# Patient Record
Sex: Female | Born: 1965 | Race: White | Hispanic: No | Marital: Married | Smoking: Never smoker
Health system: Southern US, Community
[De-identification: ages and names within clinical notes are randomized; demographics above are authoritative.]

## PROBLEM LIST (undated history)

## (undated) DIAGNOSIS — L409 Psoriasis, unspecified: Secondary | ICD-10-CM

## (undated) DIAGNOSIS — D369 Benign neoplasm, unspecified site: Secondary | ICD-10-CM

## (undated) DIAGNOSIS — F329 Major depressive disorder, single episode, unspecified: Secondary | ICD-10-CM

## (undated) DIAGNOSIS — G8929 Other chronic pain: Secondary | ICD-10-CM

## (undated) DIAGNOSIS — N390 Urinary tract infection, site not specified: Secondary | ICD-10-CM

## (undated) DIAGNOSIS — N201 Calculus of ureter: Secondary | ICD-10-CM

## (undated) DIAGNOSIS — G4730 Sleep apnea, unspecified: Secondary | ICD-10-CM

## (undated) DIAGNOSIS — R002 Palpitations: Secondary | ICD-10-CM

## (undated) DIAGNOSIS — K5790 Diverticulosis of intestine, part unspecified, without perforation or abscess without bleeding: Secondary | ICD-10-CM

## (undated) DIAGNOSIS — Z9889 Other specified postprocedural states: Secondary | ICD-10-CM

## (undated) DIAGNOSIS — I10 Essential (primary) hypertension: Secondary | ICD-10-CM

## (undated) DIAGNOSIS — K449 Diaphragmatic hernia without obstruction or gangrene: Secondary | ICD-10-CM

## (undated) DIAGNOSIS — A0472 Enterocolitis due to Clostridium difficile, not specified as recurrent: Secondary | ICD-10-CM

## (undated) DIAGNOSIS — E119 Type 2 diabetes mellitus without complications: Secondary | ICD-10-CM

## (undated) DIAGNOSIS — G479 Sleep disorder, unspecified: Secondary | ICD-10-CM

## (undated) DIAGNOSIS — R112 Nausea with vomiting, unspecified: Secondary | ICD-10-CM

## (undated) DIAGNOSIS — K429 Umbilical hernia without obstruction or gangrene: Secondary | ICD-10-CM

## (undated) DIAGNOSIS — M1990 Unspecified osteoarthritis, unspecified site: Secondary | ICD-10-CM

## (undated) DIAGNOSIS — N200 Calculus of kidney: Secondary | ICD-10-CM

## (undated) DIAGNOSIS — F32A Depression, unspecified: Secondary | ICD-10-CM

## (undated) DIAGNOSIS — K219 Gastro-esophageal reflux disease without esophagitis: Secondary | ICD-10-CM

## (undated) DIAGNOSIS — M549 Dorsalgia, unspecified: Secondary | ICD-10-CM

## (undated) DIAGNOSIS — F909 Attention-deficit hyperactivity disorder, unspecified type: Secondary | ICD-10-CM

## (undated) HISTORY — PX: CHOLECYSTECTOMY: SHX55

## (undated) HISTORY — PX: CYSTOSCOPY WITH RETROGRADE PYELOGRAM, URETEROSCOPY AND STENT PLACEMENT: SHX5789

## (undated) HISTORY — DX: Benign neoplasm, unspecified site: D36.9

## (undated) HISTORY — DX: Depression, unspecified: F32.A

## (undated) HISTORY — PX: ANTERIOR CRUCIATE LIGAMENT REPAIR: SHX115

## (undated) HISTORY — DX: Major depressive disorder, single episode, unspecified: F32.9

## (undated) HISTORY — DX: Diaphragmatic hernia without obstruction or gangrene: K44.9

## (undated) HISTORY — DX: Gastro-esophageal reflux disease without esophagitis: K21.9

## (undated) HISTORY — PX: PARTIAL COLECTOMY: SHX5273

## (undated) HISTORY — PX: WRIST SURGERY: SHX841

## (undated) HISTORY — DX: Enterocolitis due to Clostridium difficile, not specified as recurrent: A04.72

---

## 2005-08-24 ENCOUNTER — Emergency Department (HOSPITAL_COMMUNITY): Admission: EM | Admit: 2005-08-24 | Discharge: 2005-08-24 | Payer: Self-pay | Admitting: Family Medicine

## 2005-09-05 ENCOUNTER — Emergency Department (HOSPITAL_COMMUNITY): Admission: EM | Admit: 2005-09-05 | Discharge: 2005-09-05 | Payer: Self-pay | Admitting: Family Medicine

## 2005-11-26 ENCOUNTER — Emergency Department (HOSPITAL_COMMUNITY): Admission: EM | Admit: 2005-11-26 | Discharge: 2005-11-26 | Payer: Self-pay | Admitting: Emergency Medicine

## 2005-12-10 ENCOUNTER — Encounter: Admission: RE | Admit: 2005-12-10 | Discharge: 2005-12-10 | Payer: Self-pay | Admitting: Occupational Medicine

## 2005-12-27 ENCOUNTER — Encounter: Admission: RE | Admit: 2005-12-27 | Discharge: 2006-03-27 | Payer: Self-pay | Admitting: Occupational Medicine

## 2006-02-10 ENCOUNTER — Emergency Department (HOSPITAL_COMMUNITY): Admission: EM | Admit: 2006-02-10 | Discharge: 2006-02-10 | Payer: Self-pay | Admitting: Emergency Medicine

## 2006-02-12 ENCOUNTER — Encounter
Admission: RE | Admit: 2006-02-12 | Discharge: 2006-03-27 | Payer: Self-pay | Admitting: Physical Medicine and Rehabilitation

## 2006-02-14 ENCOUNTER — Encounter
Admission: RE | Admit: 2006-02-14 | Discharge: 2006-02-14 | Payer: Self-pay | Admitting: Physical Medicine and Rehabilitation

## 2006-03-07 ENCOUNTER — Emergency Department (HOSPITAL_COMMUNITY): Admission: EM | Admit: 2006-03-07 | Discharge: 2006-03-07 | Payer: Self-pay | Admitting: Emergency Medicine

## 2006-03-15 ENCOUNTER — Encounter
Admission: RE | Admit: 2006-03-15 | Discharge: 2006-03-15 | Payer: Self-pay | Admitting: Physical Medicine and Rehabilitation

## 2006-05-29 ENCOUNTER — Encounter
Admission: RE | Admit: 2006-05-29 | Discharge: 2006-05-29 | Payer: Self-pay | Admitting: Physical Medicine and Rehabilitation

## 2006-07-07 ENCOUNTER — Emergency Department (HOSPITAL_COMMUNITY): Admission: EM | Admit: 2006-07-07 | Discharge: 2006-07-07 | Payer: Self-pay | Admitting: Family Medicine

## 2006-07-15 ENCOUNTER — Encounter: Admission: RE | Admit: 2006-07-15 | Discharge: 2006-07-15 | Payer: Self-pay | Admitting: Specialist

## 2006-09-28 ENCOUNTER — Emergency Department (HOSPITAL_COMMUNITY): Admission: EM | Admit: 2006-09-28 | Discharge: 2006-09-28 | Payer: Self-pay | Admitting: Emergency Medicine

## 2006-11-10 ENCOUNTER — Emergency Department (HOSPITAL_COMMUNITY): Admission: EM | Admit: 2006-11-10 | Discharge: 2006-11-10 | Payer: Self-pay | Admitting: Emergency Medicine

## 2006-11-17 ENCOUNTER — Ambulatory Visit (HOSPITAL_COMMUNITY): Admission: EM | Admit: 2006-11-17 | Discharge: 2006-11-17 | Payer: Self-pay | Admitting: Emergency Medicine

## 2006-11-28 ENCOUNTER — Encounter: Admission: RE | Admit: 2006-11-28 | Discharge: 2006-11-28 | Payer: Self-pay | Admitting: Specialist

## 2006-12-15 ENCOUNTER — Emergency Department (HOSPITAL_COMMUNITY): Admission: EM | Admit: 2006-12-15 | Discharge: 2006-12-15 | Payer: Self-pay | Admitting: Emergency Medicine

## 2006-12-26 ENCOUNTER — Encounter: Admission: RE | Admit: 2006-12-26 | Discharge: 2006-12-26 | Payer: Self-pay | Admitting: Family Medicine

## 2006-12-26 ENCOUNTER — Ambulatory Visit (HOSPITAL_COMMUNITY): Admission: RE | Admit: 2006-12-26 | Discharge: 2006-12-26 | Payer: Self-pay | Admitting: Family Medicine

## 2007-01-17 ENCOUNTER — Emergency Department (HOSPITAL_COMMUNITY): Admission: EM | Admit: 2007-01-17 | Discharge: 2007-01-17 | Payer: Self-pay | Admitting: Emergency Medicine

## 2007-02-03 ENCOUNTER — Encounter
Admission: RE | Admit: 2007-02-03 | Discharge: 2007-03-12 | Payer: Self-pay | Admitting: Physical Medicine and Rehabilitation

## 2007-03-18 ENCOUNTER — Encounter
Admission: RE | Admit: 2007-03-18 | Discharge: 2007-06-16 | Payer: Self-pay | Admitting: Physical Medicine and Rehabilitation

## 2007-03-31 ENCOUNTER — Emergency Department (HOSPITAL_COMMUNITY): Admission: EM | Admit: 2007-03-31 | Discharge: 2007-03-31 | Payer: Self-pay | Admitting: Emergency Medicine

## 2007-05-13 ENCOUNTER — Emergency Department (HOSPITAL_COMMUNITY): Admission: EM | Admit: 2007-05-13 | Discharge: 2007-05-13 | Payer: Self-pay | Admitting: Emergency Medicine

## 2007-05-21 ENCOUNTER — Encounter: Admission: RE | Admit: 2007-05-21 | Discharge: 2007-08-19 | Payer: Self-pay | Admitting: Internal Medicine

## 2007-06-23 ENCOUNTER — Emergency Department (HOSPITAL_COMMUNITY): Admission: EM | Admit: 2007-06-23 | Discharge: 2007-06-23 | Payer: Self-pay | Admitting: Emergency Medicine

## 2007-07-04 ENCOUNTER — Ambulatory Visit (HOSPITAL_COMMUNITY): Admission: RE | Admit: 2007-07-04 | Discharge: 2007-07-04 | Payer: Self-pay | Admitting: Sports Medicine

## 2007-07-05 ENCOUNTER — Emergency Department (HOSPITAL_COMMUNITY): Admission: EM | Admit: 2007-07-05 | Discharge: 2007-07-05 | Payer: Self-pay | Admitting: Emergency Medicine

## 2007-08-04 ENCOUNTER — Emergency Department (HOSPITAL_COMMUNITY): Admission: EM | Admit: 2007-08-04 | Discharge: 2007-08-04 | Payer: Self-pay | Admitting: Emergency Medicine

## 2007-08-25 ENCOUNTER — Emergency Department (HOSPITAL_BASED_OUTPATIENT_CLINIC_OR_DEPARTMENT_OTHER): Admission: EM | Admit: 2007-08-25 | Discharge: 2007-08-25 | Payer: Self-pay | Admitting: Emergency Medicine

## 2007-09-12 ENCOUNTER — Emergency Department (HOSPITAL_COMMUNITY): Admission: EM | Admit: 2007-09-12 | Discharge: 2007-09-12 | Payer: Self-pay | Admitting: Emergency Medicine

## 2007-11-01 ENCOUNTER — Emergency Department (HOSPITAL_COMMUNITY): Admission: EM | Admit: 2007-11-01 | Discharge: 2007-11-01 | Payer: Self-pay | Admitting: Emergency Medicine

## 2007-11-08 ENCOUNTER — Emergency Department (HOSPITAL_COMMUNITY): Admission: EM | Admit: 2007-11-08 | Discharge: 2007-11-08 | Payer: Self-pay | Admitting: Emergency Medicine

## 2007-11-11 ENCOUNTER — Encounter: Admission: RE | Admit: 2007-11-11 | Discharge: 2007-12-30 | Payer: Self-pay | Admitting: Orthopedic Surgery

## 2007-11-19 ENCOUNTER — Emergency Department (HOSPITAL_COMMUNITY): Admission: EM | Admit: 2007-11-19 | Discharge: 2007-11-19 | Payer: Self-pay | Admitting: Emergency Medicine

## 2008-05-02 ENCOUNTER — Emergency Department (HOSPITAL_BASED_OUTPATIENT_CLINIC_OR_DEPARTMENT_OTHER): Admission: EM | Admit: 2008-05-02 | Discharge: 2008-05-02 | Payer: Self-pay | Admitting: Emergency Medicine

## 2008-05-07 ENCOUNTER — Emergency Department (HOSPITAL_COMMUNITY): Admission: EM | Admit: 2008-05-07 | Discharge: 2008-05-07 | Payer: Self-pay | Admitting: Family Medicine

## 2008-08-18 ENCOUNTER — Emergency Department (HOSPITAL_COMMUNITY): Admission: EM | Admit: 2008-08-18 | Discharge: 2008-08-18 | Payer: Self-pay | Admitting: Family Medicine

## 2008-09-07 ENCOUNTER — Emergency Department (HOSPITAL_COMMUNITY): Admission: EM | Admit: 2008-09-07 | Discharge: 2008-09-07 | Payer: Self-pay | Admitting: Emergency Medicine

## 2008-09-13 ENCOUNTER — Emergency Department (HOSPITAL_COMMUNITY): Admission: EM | Admit: 2008-09-13 | Discharge: 2008-09-13 | Payer: Self-pay | Admitting: Emergency Medicine

## 2008-12-05 ENCOUNTER — Emergency Department (HOSPITAL_COMMUNITY): Admission: EM | Admit: 2008-12-05 | Discharge: 2008-12-05 | Payer: Self-pay | Admitting: Emergency Medicine

## 2008-12-13 ENCOUNTER — Emergency Department (HOSPITAL_COMMUNITY): Admission: EM | Admit: 2008-12-13 | Discharge: 2008-12-13 | Payer: Self-pay | Admitting: Emergency Medicine

## 2008-12-24 ENCOUNTER — Emergency Department (HOSPITAL_COMMUNITY): Admission: EM | Admit: 2008-12-24 | Discharge: 2008-12-24 | Payer: Self-pay | Admitting: Emergency Medicine

## 2009-01-05 ENCOUNTER — Encounter: Admission: RE | Admit: 2009-01-05 | Discharge: 2009-01-05 | Payer: Self-pay | Admitting: Family Medicine

## 2009-01-07 ENCOUNTER — Ambulatory Visit (HOSPITAL_BASED_OUTPATIENT_CLINIC_OR_DEPARTMENT_OTHER): Admission: RE | Admit: 2009-01-07 | Discharge: 2009-01-07 | Payer: Self-pay | Admitting: Urology

## 2009-01-08 ENCOUNTER — Inpatient Hospital Stay (HOSPITAL_COMMUNITY): Admission: EM | Admit: 2009-01-08 | Discharge: 2009-01-08 | Payer: Self-pay | Admitting: Emergency Medicine

## 2009-01-13 ENCOUNTER — Ambulatory Visit (HOSPITAL_BASED_OUTPATIENT_CLINIC_OR_DEPARTMENT_OTHER): Admission: RE | Admit: 2009-01-13 | Discharge: 2009-01-13 | Payer: Self-pay | Admitting: Urology

## 2009-03-16 ENCOUNTER — Emergency Department (HOSPITAL_COMMUNITY): Admission: EM | Admit: 2009-03-16 | Discharge: 2009-03-16 | Payer: Self-pay | Admitting: Family Medicine

## 2009-03-22 ENCOUNTER — Emergency Department (HOSPITAL_COMMUNITY): Admission: EM | Admit: 2009-03-22 | Discharge: 2009-03-22 | Payer: Self-pay | Admitting: Emergency Medicine

## 2009-08-14 ENCOUNTER — Emergency Department (HOSPITAL_COMMUNITY): Admission: EM | Admit: 2009-08-14 | Discharge: 2009-08-14 | Payer: Self-pay | Admitting: Emergency Medicine

## 2009-08-16 ENCOUNTER — Emergency Department (HOSPITAL_COMMUNITY): Admission: EM | Admit: 2009-08-16 | Discharge: 2009-08-16 | Payer: Self-pay | Admitting: Emergency Medicine

## 2009-09-07 ENCOUNTER — Emergency Department (HOSPITAL_COMMUNITY): Admission: EM | Admit: 2009-09-07 | Discharge: 2009-09-07 | Payer: Self-pay | Admitting: Emergency Medicine

## 2009-10-21 ENCOUNTER — Ambulatory Visit (HOSPITAL_COMMUNITY): Admission: RE | Admit: 2009-10-21 | Discharge: 2009-10-21 | Payer: Self-pay | Admitting: Urology

## 2009-10-28 ENCOUNTER — Emergency Department (HOSPITAL_COMMUNITY): Admission: EM | Admit: 2009-10-28 | Discharge: 2009-10-28 | Payer: Self-pay | Admitting: Family Medicine

## 2009-12-20 ENCOUNTER — Emergency Department (HOSPITAL_COMMUNITY)
Admission: EM | Admit: 2009-12-20 | Discharge: 2009-12-20 | Payer: Self-pay | Source: Home / Self Care | Admitting: Family Medicine

## 2010-04-10 ENCOUNTER — Other Ambulatory Visit (HOSPITAL_COMMUNITY): Payer: Self-pay | Admitting: Neurosurgery

## 2010-04-10 DIAGNOSIS — R52 Pain, unspecified: Secondary | ICD-10-CM

## 2010-04-22 ENCOUNTER — Other Ambulatory Visit (HOSPITAL_COMMUNITY): Payer: Self-pay

## 2010-04-22 ENCOUNTER — Ambulatory Visit (HOSPITAL_COMMUNITY)
Admission: RE | Admit: 2010-04-22 | Discharge: 2010-04-22 | Disposition: A | Discharge status: Home / Self Care | Payer: PRIVATE HEALTH INSURANCE | Source: Ambulatory Visit | Attending: Neurosurgery | Admitting: Neurosurgery

## 2010-04-22 ENCOUNTER — Encounter (HOSPITAL_COMMUNITY): Payer: Self-pay

## 2010-04-22 DIAGNOSIS — M545 Low back pain, unspecified: Secondary | ICD-10-CM | POA: Insufficient documentation

## 2010-04-22 DIAGNOSIS — M51379 Other intervertebral disc degeneration, lumbosacral region without mention of lumbar back pain or lower extremity pain: Secondary | ICD-10-CM | POA: Insufficient documentation

## 2010-04-22 DIAGNOSIS — M48061 Spinal stenosis, lumbar region without neurogenic claudication: Secondary | ICD-10-CM | POA: Insufficient documentation

## 2010-04-22 DIAGNOSIS — M5137 Other intervertebral disc degeneration, lumbosacral region: Secondary | ICD-10-CM | POA: Insufficient documentation

## 2010-04-22 DIAGNOSIS — R52 Pain, unspecified: Secondary | ICD-10-CM

## 2010-04-22 DIAGNOSIS — M5126 Other intervertebral disc displacement, lumbar region: Secondary | ICD-10-CM | POA: Insufficient documentation

## 2010-04-22 DIAGNOSIS — M519 Unspecified thoracic, thoracolumbar and lumbosacral intervertebral disc disorder: Secondary | ICD-10-CM | POA: Insufficient documentation

## 2010-05-25 LAB — POCT RAPID STREP A (OFFICE): Streptococcus, Group A Screen (Direct): NEGATIVE

## 2010-05-26 LAB — URINE CULTURE: Culture  Setup Time: 201108200116

## 2010-05-26 LAB — SURGICAL PCR SCREEN: Staphylococcus aureus: POSITIVE — AB

## 2010-05-26 LAB — PREGNANCY, URINE: Preg Test, Ur: NEGATIVE

## 2010-05-26 LAB — POCT URINALYSIS DIPSTICK
Bilirubin Urine: NEGATIVE
Ketones, ur: NEGATIVE mg/dL
pH: 5.5 (ref 5.0–8.0)

## 2010-05-28 LAB — POCT I-STAT, CHEM 8
BUN: 24 mg/dL — ABNORMAL HIGH (ref 6–23)
Calcium, Ion: 1.15 mmol/L (ref 1.12–1.32)
Chloride: 105 mEq/L (ref 96–112)
Creatinine, Ser: 0.8 mg/dL (ref 0.4–1.2)
Glucose, Bld: 115 mg/dL — ABNORMAL HIGH (ref 70–99)
HCT: 45 % (ref 36.0–46.0)
Potassium: 4 mEq/L (ref 3.5–5.1)

## 2010-05-28 LAB — POCT URINALYSIS DIP (DEVICE)
Nitrite: NEGATIVE
Specific Gravity, Urine: >=1.03 (ref 1.005–1.030)
pH: 5.5 (ref 5.0–8.0)

## 2010-05-28 LAB — CBC
Hemoglobin: 14.7 g/dL (ref 12.0–15.0)
MCHC: 34.5 g/dL (ref 30.0–36.0)
MCV: 90.5 fL (ref 78.0–100.0)
RBC: 4.71 MIL/uL (ref 3.87–5.11)
WBC: 8.6 10*3/uL (ref 4.0–10.5)

## 2010-05-28 LAB — DIFFERENTIAL
Basophils Relative: 0 % (ref 0–1)
Lymphs Abs: 0.8 10*3/uL (ref 0.7–4.0)
Monocytes Absolute: 0.6 10*3/uL (ref 0.1–1.0)
Monocytes Relative: 7 % (ref 3–12)
Neutro Abs: 7.1 10*3/uL (ref 1.7–7.7)

## 2010-06-15 LAB — URINALYSIS, ROUTINE W REFLEX MICROSCOPIC
Bilirubin Urine: NEGATIVE
Glucose, UA: 250 mg/dL — AB
Glucose, UA: NEGATIVE mg/dL
Ketones, ur: NEGATIVE mg/dL
Leukocytes, UA: NEGATIVE
Leukocytes, UA: NEGATIVE
Nitrite: POSITIVE — AB
Protein, ur: NEGATIVE mg/dL
Protein, ur: NEGATIVE mg/dL
Protein, ur: 30 mg/dL — AB
Specific Gravity, Urine: 1.005 (ref 1.005–1.030)
Specific Gravity, Urine: 1.01 (ref 1.005–1.030)
Urobilinogen, UA: 0.2 mg/dL (ref 0.0–1.0)
Urobilinogen, UA: 0.2 mg/dL (ref 0.0–1.0)
Urobilinogen, UA: 0.2 mg/dL (ref 0.0–1.0)
pH: 6 (ref 5.0–8.0)

## 2010-06-15 LAB — URINE CULTURE: Colony Count: >=100000

## 2010-06-15 LAB — POCT I-STAT, CHEM 8
BUN: 11 mg/dL (ref 6–23)
BUN: 16 mg/dL (ref 6–23)
Calcium, Ion: 1.22 mmol/L (ref 1.12–1.32)
Chloride: 105 mEq/L (ref 96–112)
Creatinine, Ser: 0.7 mg/dL (ref 0.4–1.2)
Glucose, Bld: 91 mg/dL (ref 70–99)
Hemoglobin: 12.6 g/dL (ref 12.0–15.0)
Potassium: 3.9 mEq/L (ref 3.5–5.1)
Potassium: 4.2 mEq/L (ref 3.5–5.1)
Sodium: 137 mEq/L (ref 135–145)
TCO2: 21 mmol/L (ref 0–100)

## 2010-06-15 LAB — URINE MICROSCOPIC-ADD ON

## 2010-06-15 LAB — DIFFERENTIAL
Basophils Absolute: 0 10*3/uL (ref 0.0–0.1)
Basophils Relative: 0 % (ref 0–1)
Eosinophils Relative: 0 % (ref 0–5)
Monocytes Absolute: 0.1 10*3/uL (ref 0.1–1.0)
Monocytes Relative: 2 % — ABNORMAL LOW (ref 3–12)
Neutro Abs: 8.3 10*3/uL — ABNORMAL HIGH (ref 1.7–7.7)

## 2010-06-15 LAB — CBC
HCT: 36.5 % (ref 36.0–46.0)
Hemoglobin: 12.2 g/dL (ref 12.0–15.0)
MCHC: 33.6 g/dL (ref 30.0–36.0)
RBC: 3.96 MIL/uL (ref 3.87–5.11)
RDW: 13.7 % (ref 11.5–15.5)

## 2010-07-25 NOTE — Op Note (Signed)
NAMESAKARA, [name]            ACCOUNT NO.:  [id number]   MEDICAL RECORD NO.:  [id number]          PATIENT TYPE:  INP   LOCATION:  0098                         FACILITY:  [facility]   PHYSICIAN:  Excell Seltzer. Annabell Howells, M.D.    DATE OF BIRTH:  [birth year]   DATE OF PROCEDURE:  11/17/2006  DATE OF DISCHARGE:  11/17/2006                               OPERATIVE REPORT   PROCEDURE:  1. Cystoscopy.  2. Left retrograde pyelogram with interpretation.  3. Left flexible ureteroscopy with holmium laser tripsy and stone      extraction.  4. Placement of left 24 cm x 6-French double-J stent.   PREOPERATIVE DIAGNOSIS:  Left proximal ureteral stone.   POSTOPERATIVE DIAGNOSIS:  Left proximal ureteral stone.   SURGEON:  Excell Seltzer. Annabell Howells, M.D.   ANESTHESIA:  General specimen.   SPECIMENS:  Small stone fragment to pathology.   COMPLICATIONS:  None.   INDICATIONS:  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  She was seen in the ER a week or so ago for the same  complaint.  She has elected ureteroscopic treatment.   FINDINGS/PROCEDURE:  The patient is given Ancef and she was taken  operating room where general anesthetic was induced.  She was placed in  lithotomy position.  Her perineum and genitalia were prepped with  Betadine solution.  She was draped in usual sterile fashion.  Cystoscopy  was performed using a 22-French scope and the 12-degree lens.  Examination revealed a normal urethra.  The bladder wall was entirely  inspected and was unremarkable.  The ureteral orifices were  unremarkable.   Left ureteral orifice was cannulated with 5-French open-end catheter and  contrast was instilled in retrograde fashion.   Left retrograde pyelogram demonstrated a stone in the proximal ureter  with mild hydronephrosis.  The stone appears to be approximately 4 mm as  noted on CT scan.  No other abnormalities  were noted.   A guidewire was then passed to the kidney and the 12-French inner core  of an access sheath with dilator was used to dilate the ureter to the  kidney.  A 6-French flexible ureteroscope was then placed over the wire  to the kidney.  Inspection revealed that her stone had been pushed back  into the upper pole calix.  Thorough inspection of the remaining calyces  revealed no loose stones.   The holmium laser was used with the 0.273 fiber on 0.5 joules initially  at 8 Hz.  Was increased 0.8 joules as the treatment progressed.  The  stone was engaged with the laser and fragmented into approximately 2 mm  fragments.  A single small fragment was then removed with the nitinol  basket.   At this point a guidewire was reinserted to the kidney.  A 6-French 24  cm double-J stent was placed without difficulty under fluoroscopic  guidance.  Upon removal of the wire, good coil was noted in the kidney  and a good coil was noted in the bladder.  The bladder was drained.  The  patient was taken down from lithotomy position.  Her anesthetic was  reversed and she was moved to the  recovery room in stable condition.  There were no complications.      Excell Seltzer. Annabell Howells, M.D.  Electronically Signed     JJW/MEDQ  D:  11/17/2006  T:  11/18/2006  Job:  [id number]

## 2010-07-25 NOTE — Op Note (Signed)
[name], [name]            ACCOUNT NO.:  [id number]   MEDICAL RECORD NO.:  [id number]          PATIENT TYPE:  INP   LOCATION:  0098                         FACILITY:  [facility]   PHYSICIAN:  Excell Seltzer. Annabell Howells, M.D.    DATE OF BIRTH:  07-28-1965   DATE OF PROCEDURE:  11/17/2006  DATE OF DISCHARGE:                               OPERATIVE REPORT   CHIEF COMPLAINT:  Left flank pain.   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  She then had onset at 3:00 a.m. today of recurrent severe left  flank pain with nausea and vomiting.  A repeat CT scan reveals a 4-mm  left proximal stone in the same position as the prior stone.  She does  have hydronephrosis.  She has only gotten partial relief with pain  medication.   ALLERGIES:  CIPRO AND MORPHINE.   CURRENT MEDICATIONS:  Cymbalta, Robaxin, Ultram, ibuprofen, clonazepam,  and Diflucan.   PAST MEDICAL HISTORY:  1. Prior depression.  2. 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  3. GE reflux.  4. Recurrent yeast infections.   PAST SURGICAL HISTORY:  Left knee and right wrist surgery.   OBSTETRICAL HISTORY:  She is gravida 2, para 2 with vaginal deliveries  but currently has an IUD in place.   FAMILY HISTORY:  Noncontributory.   SOCIAL HISTORY:  She denies tobacco.  She drinks rare alcohol.   REVIEW OF SYSTEMS:  She has had chills without fever.  She has chronic  back pain with radicular pain into the lower extremities.  She has had  nausea and vomiting.  She denies any voiding complaints or hematuria.  She is otherwise entirely without complaints per full review of systems.   PHYSICAL EXAMINATION:  VITAL SIGNS:  Temperature 98.9, pulse 118,  respirations 24, blood pressure is 148/92.  GENERAL:  She is a well-developed, well-nourished  white female in no  acute distress, alert and oriented x3.  HEENT:  Normocephalic, atraumatic.  NECK:  Supple without thyromegaly or bruit.  LUNGS:  Clear with normal effort.  HEART:  Regular rate and rhythm.  ABDOMEN:  Soft, obese, with left upper quadrant and left CVA tenderness.  There is actually some referred pain from the right lower quadrant into  the left abdomen.  No mass, hepatosplenomegaly, or hernias are noted.  GU/RECTAL:  Exam was not done.  EXTREMITIES:  Full range of motion without edema.  NEUROLOGIC:  Grossly intact but medicated.  SKIN:  Warm and dry.   I have personally reviewed her CT films and concur with the findings.   LABORATORY DATA:  Her labs were reviewed.  Her white count is 8.4,  hemoglobin 12.8.  Electrolytes within normal limits with a BUN of 16,  creatinine 0.85.  Urinalysis is nitrite-negative with 3-6 white cells, 0-  2 red cells, and calcium oxalate crystals.   IMPRESSION:  Symptomatic left proximal ureteral stone.  PLAN:  I have discussed the options of continued observation versus  surgical intervention with the patient.  At this point, she wants to go  head and get something done.  I am going to get her set up for left  ureteroscopic stone extraction with possible stenting.  I explained the  risks in detail, including the possibility that the stone may not be  accessible and we may just have to leave a stent and do lithotripsy at a  later date.      Excell Seltzer. Annabell Howells, M.D.  Electronically Signed     JJW/MEDQ  D:  11/17/2006  T:  11/17/2006  Job:  [id number]

## 2010-07-25 NOTE — Consult Note (Signed)
NAMEDEEPA, [name]            ACCOUNT NO.:  [id number]   MEDICAL RECORD NO.:  [id number]          PATIENT TYPE:  INP   LOCATION:  0098                         FACILITY:  [facility]   PHYSICIAN:  Excell Seltzer. Annabell Howells, M.D.    DATE OF BIRTH:  May 22, 1965   DATE OF CONSULTATION:  11/17/2006  DATE OF DISCHARGE:  11/17/2006                                 CONSULTATION   HISTORY:  [name] is a 45 year old white female who I was asked to  see in consultation in the emergency room for severe left flank pain  that began at 3 a.m. this morning.  She was found on CT scan to have a 4-  mm, left proximal ureteral stone with hydro.  She was seen in the ER  approximately a week before for similar complaints, but felt she has  passed a stone.  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  She currently denies voiding complaints.   ALLERGIES:  Itching and nausea with MORPHINE and allergy to CIPRO.   CURRENT MEDICATIONS:  Cymbalta, Robaxin, Ultram, ibuprofen, clonazepam,  and Diflucan.   PAST MEDICAL HISTORY:  1. Depression.  2. Degenerative disk disease with a herniated disk and sciatica.  3. GE reflux.  4. Recurrent yeast infections.   PAST SURGICAL HISTORY:  Pertinent for left knee and right wrist surgery.   OB/GYN:  She is gravida 2, para 2 with vaginal deliveries and has an IUD  currently.   FAMILY HISTORY:  Noncontributory.   SOCIAL HISTORY:  No tobacco.  She drinks rare alcohol.  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   REVIEW OF SYSTEMS:  She has had chills without fever.  She has chronic  back pain with radicular pain.  She has had nausea and vomiting with her  current episode.  She denies any voiding complaints or hematuria.  Her  complete review of systems is otherwise unremarkable.   PHYSICAL EXAMINATION:  VITAL SIGNS:  Blood pressure is 148/92,  temperature 98.9, pulse 118, respirations 24.  GENERAL:  She is a well-developed, slightly  medicated white female in no  acute distress, alert and oriented x3.  NECK:  Supple without thyromegaly or bruit.  LUNGS:  Clear with normal effort.  HEART:  Regular rate and rhythm.  ABDOMEN:  Soft, obese with left CVA tenderness.  Left upper quadrant has  some tenderness and some referred pain.  She has no mass, splenomegaly  or hernias.  GU:  Normal external genitalia, urethral meatus unremarkable.  EXTREMITIES:  Have full range of motion without edema.  NEUROLOGIC:  Neurologic was grossly intact except for being slightly  medicated.  SKIN:  Warm and dry.   LABORATORY DATA:  I have reviewed her CT report and films.  I have also  reviewed her CBC, CMP and urinalysis.   IMPRESSION:  Left proximal ureteral stone.   PLAN:  After discussing options of continued observation, lithotripsy  and ureteroscopy, she has elected ureteroscopy.  Will proceed with  ureteroscopy with laser tripsy and possible stent placement later this  afternoon.  The risks  of the procedure were explained in detail.      Excell Seltzer. Annabell Howells, M.D.  Electronically Signed     JJW/MEDQ  D:  11/17/2006  T:  11/18/2006  Job:  [id number]

## 2010-07-25 NOTE — H&P (Signed)
NAMEMAHKAYLA, [name]            ACCOUNT NO.:  [id number]   MEDICAL RECORD NO.:  [id number]          PATIENT TYPE:  INP   LOCATION:  0098                         [facility]  [facility]   PHYSICIAN:  Corinna L. Lendell Caprice, MDDATE OF BIRTH:  Oct 12, 1965   DATE OF ADMISSION:  11/17/2006  DATE OF DISCHARGE:  11/17/2006                              HISTORY & PHYSICAL   NO DICTATION.      Corinna L. Lendell Caprice, MD  Electronically Signed     CLS/MEDQ  D:  11/17/2006  T:  11/17/2006  Job:  [id number]

## 2010-11-09 ENCOUNTER — Other Ambulatory Visit: Payer: Self-pay | Admitting: Family Medicine

## 2010-11-09 DIAGNOSIS — E041 Nontoxic single thyroid nodule: Secondary | ICD-10-CM

## 2010-11-10 ENCOUNTER — Other Ambulatory Visit: Payer: Self-pay | Admitting: Family Medicine

## 2010-11-15 ENCOUNTER — Emergency Department: Payer: Self-pay | Admitting: Emergency Medicine

## 2010-11-16 ENCOUNTER — Other Ambulatory Visit: Payer: PRIVATE HEALTH INSURANCE

## 2010-11-28 ENCOUNTER — Inpatient Hospital Stay: Payer: Self-pay | Admitting: Internal Medicine

## 2010-12-05 ENCOUNTER — Ambulatory Visit: Payer: Self-pay | Admitting: Gastroenterology

## 2010-12-06 ENCOUNTER — Ambulatory Visit (INDEPENDENT_AMBULATORY_CARE_PROVIDER_SITE_OTHER): Payer: PRIVATE HEALTH INSURANCE | Admitting: Surgery

## 2010-12-07 ENCOUNTER — Emergency Department: Payer: Self-pay | Admitting: Emergency Medicine

## 2010-12-20 ENCOUNTER — Ambulatory Visit: Payer: Self-pay | Admitting: Surgery

## 2010-12-21 LAB — DIFFERENTIAL
Basophils Absolute: 0
Basophils Relative: 0
Eosinophils Relative: 2
Monocytes Absolute: 0.4

## 2010-12-21 LAB — URINALYSIS, ROUTINE W REFLEX MICROSCOPIC
Bilirubin Urine: NEGATIVE
Ketones, ur: NEGATIVE
Nitrite: NEGATIVE
Urobilinogen, UA: 0.2
pH: 5.5

## 2010-12-21 LAB — BASIC METABOLIC PANEL
CO2: 25
Glucose, Bld: 94
Potassium: 4.6
Sodium: 133 — ABNORMAL LOW

## 2010-12-21 LAB — CBC
HCT: 38.4
Hemoglobin: 13.3
MCHC: 34.6
RDW: 13.5

## 2010-12-22 ENCOUNTER — Ambulatory Visit: Payer: Self-pay | Admitting: Surgery

## 2010-12-22 LAB — URINE MICROSCOPIC-ADD ON

## 2010-12-22 LAB — COMPREHENSIVE METABOLIC PANEL
ALT: 22
AST: 18
Albumin: 3.6
CO2: 26
Calcium: 9
Chloride: 106
GFR calc Af Amer: >60
GFR calc non Af Amer: >60
Sodium: 139
Total Bilirubin: 0.8

## 2010-12-22 LAB — CBC
Hemoglobin: 12.8
Platelets: 214
RBC: 4.15
RBC: 4.01
WBC: 8.4
WBC: 8.1

## 2010-12-22 LAB — DIFFERENTIAL
Eosinophils Absolute: 0.1
Eosinophils Relative: 1
Lymphocytes Relative: 27
Lymphocytes Relative: 26
Lymphs Abs: 2.3
Lymphs Abs: 2.1
Monocytes Absolute: 0.6
Monocytes Absolute: 0.6
Monocytes Relative: 7
Neutro Abs: 5.4

## 2010-12-22 LAB — URINALYSIS, ROUTINE W REFLEX MICROSCOPIC
Bilirubin Urine: NEGATIVE
Bilirubin Urine: NEGATIVE
Glucose, UA: NEGATIVE
Glucose, UA: NEGATIVE
Hgb urine dipstick: NEGATIVE
Ketones, ur: NEGATIVE
Leukocytes, UA: NEGATIVE
Protein, ur: NEGATIVE
Specific Gravity, Urine: 1.021
Urobilinogen, UA: 0.2
pH: 5.5

## 2010-12-22 LAB — URINE CULTURE: Colony Count: 70000

## 2010-12-22 LAB — BASIC METABOLIC PANEL
CO2: 25
Calcium: 9.8
GFR calc Af Amer: >60
GFR calc non Af Amer: >60
Sodium: 139

## 2010-12-23 ENCOUNTER — Emergency Department: Payer: Self-pay | Admitting: *Deleted

## 2011-01-04 ENCOUNTER — Inpatient Hospital Stay: Payer: Self-pay | Admitting: Surgery

## 2011-01-11 ENCOUNTER — Ambulatory Visit: Payer: Self-pay | Admitting: Gastroenterology

## 2011-02-21 ENCOUNTER — Ambulatory Visit: Payer: Self-pay | Admitting: Surgery

## 2011-03-05 ENCOUNTER — Ambulatory Visit: Payer: Self-pay | Admitting: Surgery

## 2011-03-12 ENCOUNTER — Inpatient Hospital Stay: Payer: Self-pay | Admitting: Emergency Medicine

## 2011-03-14 LAB — CBC WITH DIFFERENTIAL/PLATELET
Basophil #: 0.3 10*3/uL — ABNORMAL HIGH (ref 0.0–0.1)
Basophil %: 2.5 %
Eosinophil %: 1.1 %
HCT: 30.9 % — ABNORMAL LOW (ref 35.0–47.0)
HGB: 11.1 g/dL — ABNORMAL LOW (ref 12.0–16.0)
Lymphocyte %: 10.1 %
MCH: 32.5 pg (ref 26.0–34.0)
Neutrophil #: 9.3 10*3/uL — ABNORMAL HIGH (ref 1.4–6.5)
Neutrophil %: 80.6 %
Platelet: 186 10*3/uL (ref 150–440)
RBC: 3.42 10*6/uL — ABNORMAL LOW (ref 3.80–5.20)
WBC: 11.4 10*3/uL — ABNORMAL HIGH (ref 3.6–11.0)

## 2011-03-14 LAB — CREATININE, SERUM
Creatinine: 0.72 mg/dL (ref 0.60–1.30)
EGFR (African American): >60
EGFR (Non-African Amer.): >60

## 2011-03-15 LAB — CBC WITH DIFFERENTIAL/PLATELET
Basophil #: 0 10*3/uL (ref 0.0–0.1)
Eosinophil #: 0.3 10*3/uL (ref 0.0–0.7)
HCT: 30.1 % — ABNORMAL LOW (ref 35.0–47.0)
Lymphocyte #: 1.2 10*3/uL (ref 1.0–3.6)
Lymphocyte %: 17.6 %
MCHC: 33.5 g/dL (ref 32.0–36.0)
MCV: 92 fL (ref 80–100)
Monocyte #: 0.5 10*3/uL (ref 0.0–0.7)
Neutrophil #: 5 10*3/uL (ref 1.4–6.5)
Platelet: 168 10*3/uL (ref 150–440)
RDW: 13.5 % (ref 11.5–14.5)
WBC: 7 10*3/uL (ref 3.6–11.0)

## 2011-03-15 LAB — PATHOLOGY REPORT

## 2011-03-18 LAB — CBC WITH DIFFERENTIAL/PLATELET
Basophil %: 0.5 %
Eosinophil %: 4.2 %
HCT: 33.9 % — ABNORMAL LOW (ref 35.0–47.0)
Lymphocyte %: 18.1 %
MCH: 30.9 pg (ref 26.0–34.0)
MCHC: 33.7 g/dL (ref 32.0–36.0)
Monocyte %: 5.1 %
Neutrophil #: 5.5 10*3/uL (ref 1.4–6.5)
Neutrophil %: 72.1 %
Platelet: 252 10*3/uL (ref 150–440)
RBC: 3.69 10*6/uL — ABNORMAL LOW (ref 3.80–5.20)
RDW: 13.3 % (ref 11.5–14.5)
WBC: 7.6 10*3/uL (ref 3.6–11.0)

## 2011-07-26 ENCOUNTER — Ambulatory Visit: Payer: Self-pay | Admitting: Gastroenterology

## 2011-07-26 ENCOUNTER — Other Ambulatory Visit: Payer: Self-pay | Admitting: Gastroenterology

## 2011-07-26 LAB — CLOSTRIDIUM DIFFICILE BY PCR

## 2011-07-27 LAB — WBCS, STOOL

## 2011-07-28 LAB — STOOL CULTURE

## 2011-07-31 ENCOUNTER — Inpatient Hospital Stay: Payer: Self-pay | Admitting: Internal Medicine

## 2011-07-31 LAB — URINALYSIS, COMPLETE
Glucose,UR: NEGATIVE mg/dL (ref 0–75)
Protein: NEGATIVE
Squamous Epithelial: <1
WBC UR: <1 /HPF (ref 0–5)

## 2011-08-01 LAB — CBC WITH DIFFERENTIAL/PLATELET
Basophil %: 0.2 %
Eosinophil #: 0 10*3/uL (ref 0.0–0.7)
Eosinophil %: 0 %
HCT: 36 % (ref 35.0–47.0)
Lymphocyte %: 7.4 %
MCH: 30.4 pg (ref 26.0–34.0)
MCHC: 34 g/dL (ref 32.0–36.0)
Monocyte #: 0.1 x10 3/mm — ABNORMAL LOW (ref 0.2–0.9)
Monocyte %: 0.8 %
Neutrophil #: 9.5 10*3/uL — ABNORMAL HIGH (ref 1.4–6.5)
Platelet: 268 10*3/uL (ref 150–440)
RDW: 13.6 % (ref 11.5–14.5)
WBC: 10.4 10*3/uL (ref 3.6–11.0)

## 2011-08-01 LAB — BASIC METABOLIC PANEL
BUN: 15 mg/dL (ref 7–18)
Chloride: 102 mmol/L (ref 98–107)
Co2: 25 mmol/L (ref 21–32)
Creatinine: 0.85 mg/dL (ref 0.60–1.30)
Glucose: 151 mg/dL — ABNORMAL HIGH (ref 65–99)
Osmolality: 276 (ref 275–301)

## 2011-08-08 ENCOUNTER — Other Ambulatory Visit: Payer: Self-pay | Admitting: Gastroenterology

## 2011-08-08 LAB — CLOSTRIDIUM DIFFICILE BY PCR

## 2011-08-15 ENCOUNTER — Emergency Department: Payer: Self-pay | Admitting: Emergency Medicine

## 2011-09-12 ENCOUNTER — Emergency Department: Payer: Self-pay | Admitting: Emergency Medicine

## 2011-09-12 LAB — URINALYSIS, COMPLETE
Bilirubin,UR: NEGATIVE
Blood: NEGATIVE
Glucose,UR: NEGATIVE mg/dL (ref 0–75)
Ketone: NEGATIVE
Nitrite: NEGATIVE
RBC,UR: NONE SEEN /HPF (ref 0–5)
Specific Gravity: 1.015 (ref 1.003–1.030)
Squamous Epithelial: <1
WBC UR: <1 /HPF (ref 0–5)

## 2011-09-12 LAB — COMPREHENSIVE METABOLIC PANEL
Albumin: 4.1 g/dL (ref 3.4–5.0)
Anion Gap: 7 (ref 7–16)
Calcium, Total: 9.3 mg/dL (ref 8.5–10.1)
Co2: 28 mmol/L (ref 21–32)
Creatinine: 1.02 mg/dL (ref 0.60–1.30)
EGFR (Non-African Amer.): >60
Osmolality: 274 (ref 275–301)
Potassium: 3.6 mmol/L (ref 3.5–5.1)
Sodium: 137 mmol/L (ref 136–145)

## 2011-09-12 LAB — CBC
HCT: 40.4 % (ref 35.0–47.0)
MCH: 30.8 pg (ref 26.0–34.0)
MCV: 90 fL (ref 80–100)
Platelet: 213 10*3/uL (ref 150–440)
RBC: 4.48 10*6/uL (ref 3.80–5.20)
WBC: 8.3 10*3/uL (ref 3.6–11.0)

## 2011-09-14 ENCOUNTER — Other Ambulatory Visit: Payer: Self-pay | Admitting: Gastroenterology

## 2011-09-14 LAB — CLOSTRIDIUM DIFFICILE BY PCR

## 2011-10-18 ENCOUNTER — Other Ambulatory Visit: Payer: Self-pay | Admitting: Gastroenterology

## 2011-10-24 ENCOUNTER — Other Ambulatory Visit: Payer: Self-pay | Admitting: Gastroenterology

## 2011-11-05 ENCOUNTER — Other Ambulatory Visit: Payer: Self-pay | Admitting: Gastroenterology

## 2011-11-05 LAB — CLOSTRIDIUM DIFFICILE BY PCR

## 2012-09-11 ENCOUNTER — Emergency Department (HOSPITAL_COMMUNITY)
Admission: EM | Admit: 2012-09-11 | Discharge: 2012-09-11 | Disposition: A | Discharge status: Home / Self Care | Payer: 59 | Attending: Emergency Medicine | Admitting: Emergency Medicine

## 2012-09-11 ENCOUNTER — Encounter (HOSPITAL_COMMUNITY): Payer: Self-pay | Admitting: *Deleted

## 2012-09-11 ENCOUNTER — Emergency Department (HOSPITAL_COMMUNITY): Payer: 59

## 2012-09-11 DIAGNOSIS — R1110 Vomiting, unspecified: Secondary | ICD-10-CM | POA: Insufficient documentation

## 2012-09-11 DIAGNOSIS — Z8719 Personal history of other diseases of the digestive system: Secondary | ICD-10-CM | POA: Insufficient documentation

## 2012-09-11 DIAGNOSIS — Z79899 Other long term (current) drug therapy: Secondary | ICD-10-CM | POA: Insufficient documentation

## 2012-09-11 DIAGNOSIS — Z8739 Personal history of other diseases of the musculoskeletal system and connective tissue: Secondary | ICD-10-CM | POA: Insufficient documentation

## 2012-09-11 DIAGNOSIS — N200 Calculus of kidney: Secondary | ICD-10-CM | POA: Insufficient documentation

## 2012-09-11 DIAGNOSIS — F909 Attention-deficit hyperactivity disorder, unspecified type: Secondary | ICD-10-CM | POA: Insufficient documentation

## 2012-09-11 DIAGNOSIS — Z8679 Personal history of other diseases of the circulatory system: Secondary | ICD-10-CM | POA: Insufficient documentation

## 2012-09-11 HISTORY — DX: Diverticulosis of intestine, part unspecified, without perforation or abscess without bleeding: K57.90

## 2012-09-11 HISTORY — DX: Attention-deficit hyperactivity disorder, unspecified type: F90.9

## 2012-09-11 HISTORY — DX: Palpitations: R00.2

## 2012-09-11 HISTORY — DX: Other chronic pain: G89.29

## 2012-09-11 HISTORY — DX: Dorsalgia, unspecified: M54.9

## 2012-09-11 LAB — URINALYSIS, ROUTINE W REFLEX MICROSCOPIC
Bilirubin Urine: NEGATIVE
Glucose, UA: NEGATIVE mg/dL
Ketones, ur: NEGATIVE mg/dL
Protein, ur: 30 mg/dL — AB
Urobilinogen, UA: 0.2 mg/dL (ref 0.0–1.0)

## 2012-09-11 LAB — CBC WITH DIFFERENTIAL/PLATELET
Eosinophils Absolute: 0.1 10*3/uL (ref 0.0–0.7)
Hemoglobin: 14.2 g/dL (ref 12.0–15.0)
Lymphocytes Relative: 29 % (ref 12–46)
Lymphs Abs: 2.7 10*3/uL (ref 0.7–4.0)
MCH: 31.1 pg (ref 26.0–34.0)
Monocytes Relative: 5 % (ref 3–12)
Neutro Abs: 6.2 10*3/uL (ref 1.7–7.7)
Neutrophils Relative %: 65 % (ref 43–77)
Platelets: 275 10*3/uL (ref 150–400)
RBC: 4.57 MIL/uL (ref 3.87–5.11)
WBC: 9.4 10*3/uL (ref 4.0–10.5)

## 2012-09-11 LAB — URINE MICROSCOPIC-ADD ON

## 2012-09-11 LAB — COMPREHENSIVE METABOLIC PANEL
ALT: 41 U/L — ABNORMAL HIGH (ref 0–35)
Alkaline Phosphatase: 93 U/L (ref 39–117)
BUN: 15 mg/dL (ref 6–23)
Chloride: 102 mEq/L (ref 96–112)
GFR calc Af Amer: >90 mL/min (ref >90)
Glucose, Bld: 103 mg/dL — ABNORMAL HIGH (ref 70–99)
Potassium: 3.7 mEq/L (ref 3.5–5.1)
Sodium: 141 mEq/L (ref 135–145)
Total Bilirubin: 0.3 mg/dL (ref 0.3–1.2)
Total Protein: 7.5 g/dL (ref 6.0–8.3)

## 2012-09-11 LAB — PREGNANCY, URINE: Preg Test, Ur: NEGATIVE

## 2012-09-11 MED ORDER — MORPHINE SULFATE 4 MG/ML IJ SOLN
4.0000 mg | Freq: Once | INTRAMUSCULAR | Status: AC
  Administered 2012-09-11: 4 mg via INTRAVENOUS
  Filled 2012-09-11: qty 1

## 2012-09-11 MED ORDER — TAMSULOSIN HCL 0.4 MG PO CAPS: 0.4000 mg | ORAL_CAPSULE | Freq: Every day | ORAL | Status: DC

## 2012-09-11 MED ORDER — ONDANSETRON 4 MG PO TBDP: ORAL_TABLET | ORAL | Status: DC

## 2012-09-11 MED ORDER — ONDANSETRON HCL 4 MG/2ML IJ SOLN
4.0000 mg | Freq: Once | INTRAMUSCULAR | Status: AC
  Administered 2012-09-11: 4 mg via INTRAVENOUS
  Filled 2012-09-11: qty 2

## 2012-09-11 MED ORDER — OXYCODONE-ACETAMINOPHEN 5-325 MG PO TABS: 2.0000 | ORAL_TABLET | ORAL | Status: DC | PRN

## 2012-09-11 MED ORDER — SODIUM CHLORIDE 0.9 % IV BOLUS (SEPSIS)
1000.0000 mL | Freq: Once | INTRAVENOUS | Status: AC
  Administered 2012-09-11: 1000 mL via INTRAVENOUS

## 2012-09-11 MED ORDER — TAMSULOSIN HCL 0.4 MG PO CAPS
0.4000 mg | ORAL_CAPSULE | Freq: Once | ORAL | Status: AC
  Administered 2012-09-11: 0.4 mg via ORAL
  Filled 2012-09-11 (×2): qty 1

## 2012-09-11 MED ORDER — KETOROLAC TROMETHAMINE 30 MG/ML IJ SOLN
30.0000 mg | Freq: Once | INTRAMUSCULAR | Status: AC
  Administered 2012-09-11: 30 mg via INTRAVENOUS
  Filled 2012-09-11: qty 1

## 2012-09-11 MED ORDER — DIPHENHYDRAMINE HCL 50 MG/ML IJ SOLN
25.0000 mg | Freq: Once | INTRAMUSCULAR | Status: AC
  Administered 2012-09-11: 25 mg via INTRAVENOUS
  Filled 2012-09-11: qty 1

## 2012-09-11 MED ORDER — CEPHALEXIN 500 MG PO CAPS: 500.0000 mg | ORAL_CAPSULE | Freq: Four times a day (QID) | ORAL | Status: DC

## 2012-09-11 NOTE — ED Notes (Signed)
Patient has finished drinking contrast. 

## 2012-09-11 NOTE — ED Notes (Signed)
EDP notified of continued discomfort, medications ordered

## 2012-09-11 NOTE — ED Notes (Signed)
Reports not feeling well x 4-5 days and then today having stabbing RLQ pain and n/v. Last bm this am, denies diarrhea.

## 2012-09-11 NOTE — ED Provider Notes (Signed)
47 year old female chronic abdominal pain related to diverticulitis and prior surgery comes in with a new femoral pain which is in the right side a fairly well localized. On exam, she has definite severe tenderness over McBurney's area with questionable rebound tenderness. She will be sent for CT scan to evaluate for possible appendicitis.  I saw and evaluated the patient, reviewed the resident's note and I agree with the findings and plan.   Dione Booze, MD 09/11/12 1755

## 2012-09-11 NOTE — ED Provider Notes (Signed)
History    CSN: [id number] Arrival date & time 09/11/12  1239  First MD Initiated Contact with Patient 09/11/12 1547     Chief Complaint  Patient presents with  . Abdominal Pain  . Emesis   (Consider location/radiation/quality/duration/timing/severity/associated sxs/prior Treatment) HPI Comments: Pt w/ hx of kidney stones, diverticulosis s/p surgical resection w/ fistula and chronic abd pain now w/ RLQ pain. States worsening diffuse abd pain x 1 wk, then today noted severe RLQ pain - sharp and cramping. Radiating into hip. A/w n/v x1 and anorexia. No fever. No change in chronic diarrhea/constipation. No dysuria/polyuria/hematuria. No vaginal bleeding or discharge.   Patient is a 47 y.o. female presenting with abdominal pain and vomiting.  Abdominal Pain Associated symptoms include abdominal pain and vomiting. Pertinent negatives include no chest pain, chills, congestion, coughing, fever, headaches, nausea, rash or sore throat.  Emesis Associated symptoms: abdominal pain   Associated symptoms: no chills, no diarrhea, no headaches and no sore throat    Past Medical History  Diagnosis Date  . Diverticulosis   . Chronic back pain   . ADHD (attention deficit hyperactivity disorder)   . Palpitations    History reviewed. No pertinent past surgical history. History reviewed. No pertinent family history. History  Substance Use Topics  . Smoking status: Not on file  . Smokeless tobacco: Not on file  . Alcohol Use: No   OB History   Grav Para Term Preterm Abortions TAB SAB Ect Mult Living                 Review of Systems  Constitutional: Negative for fever and chills.  HENT: Negative for congestion and sore throat.   Respiratory: Negative for cough and shortness of breath.   Cardiovascular: Negative for chest pain and leg swelling.  Gastrointestinal: Positive for vomiting and abdominal pain. Negative for nausea, diarrhea and constipation.  Genitourinary: Negative for dysuria  and frequency.  Skin: Negative for color change and rash.  Neurological: Negative for dizziness and headaches.  Psychiatric/Behavioral: Negative for confusion and agitation.  All other systems reviewed and are negative.    Allergies  Ciprofloxacin; Flagyl; and Ivp dye  Home Medications  No current outpatient prescriptions on file. BP 157/106  Pulse 102  Temp(Src) 98.7 F (37.1 C) (Oral)  Resp 16  SpO2 98% Physical Exam  Vitals reviewed. Constitutional: She is oriented to person, place, and time. She appears well-developed and well-nourished. No distress.  HENT:  Head: Normocephalic and atraumatic.  Eyes: EOM are normal. Pupils are equal, round, and reactive to light.  Neck: Normal range of motion. Neck supple.  Cardiovascular: Normal rate and regular rhythm.   Pulmonary/Chest: Effort normal. No respiratory distress.  Abdominal: Soft. She exhibits no distension. There is tenderness. There is tenderness at McBurney's point. There is no rigidity, no rebound, no guarding and no CVA tenderness.    Musculoskeletal: Normal range of motion. She exhibits no edema.  Neurological: She is alert and oriented to person, place, and time.  Skin: Skin is warm and dry.  Psychiatric: She has a normal mood and affect. Her behavior is normal.    ED Course  Procedures (including critical care time) Labs Reviewed  CBC WITH DIFFERENTIAL  COMPREHENSIVE METABOLIC PANEL  LIPASE, BLOOD  URINALYSIS, ROUTINE W REFLEX MICROSCOPIC   Results for orders placed during the hospital encounter of 09/11/12  CBC WITH DIFFERENTIAL      Result Value Range   WBC 9.4  4.0 - 10.5 K/uL   RBC  4.57  3.87 - 5.11 MIL/uL   Hemoglobin 14.2  12.0 - 15.0 g/dL   HCT 13.0  86.5 - 78.4 %   MCV 88.0  78.0 - 100.0 fL   MCH 31.1  26.0 - 34.0 pg   MCHC 35.3  30.0 - 36.0 g/dL   RDW 69.6  29.5 - 28.4 %   Platelets 275  150 - 400 K/uL   Neutrophils Relative % 65  43 - 77 %   Neutro Abs 6.2  1.7 - 7.7 K/uL   Lymphocytes  Relative 29  12 - 46 %   Lymphs Abs 2.7  0.7 - 4.0 K/uL   Monocytes Relative 5  3 - 12 %   Monocytes Absolute 0.5  0.1 - 1.0 K/uL   Eosinophils Relative 1  0 - 5 %   Eosinophils Absolute 0.1  0.0 - 0.7 K/uL   Basophils Relative 0  0 - 1 %   Basophils Absolute 0.0  0.0 - 0.1 K/uL  COMPREHENSIVE METABOLIC PANEL      Result Value Range   Sodium 141  135 - 145 mEq/L   Potassium 3.7  3.5 - 5.1 mEq/L   Chloride 102  96 - 112 mEq/L   CO2 26  19 - 32 mEq/L   Glucose, Bld 103 (*) 70 - 99 mg/dL   BUN 15  6 - 23 mg/dL   Creatinine, Ser 1.32  0.50 - 1.10 mg/dL   Calcium 9.8  8.4 - 44.0 mg/dL   Total Protein 7.5  6.0 - 8.3 g/dL   Albumin 4.4  3.5 - 5.2 g/dL   AST 22  0 - 37 U/L   ALT 41 (*) 0 - 35 U/L   Alkaline Phosphatase 93  39 - 117 U/L   Total Bilirubin 0.3  0.3 - 1.2 mg/dL   GFR calc non Af Amer 82 (*) >90 mL/min   GFR calc Af Amer >90  >90 mL/min  LIPASE, BLOOD      Result Value Range   Lipase 23  11 - 59 U/L  URINALYSIS, ROUTINE W REFLEX MICROSCOPIC      Result Value Range   Color, Urine ORANGE (*) YELLOW   APPearance CLOUDY (*) CLEAR   Specific Gravity, Urine 1.029  1.005 - 1.030   pH 5.5  5.0 - 8.0   Glucose, UA NEGATIVE  NEGATIVE mg/dL   Hgb urine dipstick LARGE (*) NEGATIVE   Bilirubin Urine NEGATIVE  NEGATIVE   Ketones, ur NEGATIVE  NEGATIVE mg/dL   Protein, ur 30 (*) NEGATIVE mg/dL   Urobilinogen, UA 0.2  0.0 - 1.0 mg/dL   Nitrite NEGATIVE  NEGATIVE   Leukocytes, UA TRACE (*) NEGATIVE  PREGNANCY, URINE      Result Value Range   Preg Test, Ur NEGATIVE  NEGATIVE  URINE MICROSCOPIC-ADD ON      Result Value Range   Squamous Epithelial / LPF RARE  RARE   WBC, UA 3-6  <3 WBC/hpf   RBC / HPF TOO NUMEROUS TO COUNT  <3 RBC/hpf   Bacteria, UA FEW (*) RARE   Crystals CA OXALATE CRYSTALS (*) NEGATIVE   Urine-Other MUCOUS PRESENT      CT Abdomen Pelvis Wo Contrast (Final result)  Result time: 09/11/12 20:54:55    Final result by Rad Results In Interface (09/11/12  20:54:55)    Narrative:   *RADIOLOGY REPORT*  Clinical Data: Right lower quadrant pain, nausea and vomiting.  CT ABDOMEN AND PELVIS WITHOUT CONTRAST  Technique: Multidetector  CT imaging of the abdomen and pelvis was performed following the standard protocol without intravenous contrast.  Comparison: CT abdomen 11/16/2010.  Findings: Mild dependent atelectasis is seen in the lung bases. No pleural or pericardial effusion.  The patient has mild to moderate right hydronephrosis due to a 0.3 cm stone just below the right renal pelvis in the proximal most ureter. Approximately four punctate nonobstructing stones are identified in the right kidney. Two punctate nonobstructing stones are identified in the left kidney. No urinary bladder stones are seen. Uterus and adnexa appear normal.  The patient is status post cholecystectomy. There is fatty infiltration of the liver without focal liver lesion identified. The spleen, adrenal glands and pancreas appear normal. Surgical anastomosis is noted in the sigmoid colon. There are a few scattered colonic diverticula but no diverticulitis. The stomach, small bowel and appendix appear normal.  IMPRESSION:  1. Mild to moderate right hydronephrosis due to a 0.3 cm proximal right ureteral stone. Additional punctate nonobstructing stones are seen bilaterally. 2. Fatty infiltration of the liver.   Original Report Authenticated By: Holley Dexter, M.D.         No results found. No diagnosis found.  MDM  Exam significant for pain at McBurneys point and + rosvig, no rebound or guarding or clinical peritonitis. CVA-nttp. Concern for appendicitis, colitis, pyelonephritis, UTI. Doubt torsion, possible cyst. CT obtained and reveals 3mm obstructing right prox ureteral stone w/ mod hydro. Appendix normal.  Cr at baseline, u/a not impressive for UTI. Labs otherwise unremarkable. No leukocytosis. Given 1L IVF, morphine, toradol, zofran and flomax.  Pain well controlled. Stable for d/c home. Given rx for percocet, flomax and keflex. Encourage fup w/ urology. Given strict return precautions for fever, intractable emesis or worsening. Pain. D/c in good condition.   1. Kidney stone    Discharge Medication List as of 09/11/2012  9:08 PM    START taking these medications   Details  !! ondansetron (ZOFRAN ODT) 4 MG disintegrating tablet 4mg  ODT q4 hours prn nausea/vomit, Print    oxyCODONE-acetaminophen (PERCOCET) 5-325 MG per tablet Take 2 tablets by mouth every 4 (four) hours as needed for pain., Starting 09/11/2012, Until Discontinued, Print    tamsulosin (FLOMAX) 0.4 MG CAPS Take 1 capsule (0.4 mg total) by mouth daily., Starting 09/11/2012, Until Discontinued, Print     !! - Potential duplicate medications found. Please discuss with provider.     Gabriel Cirri, DO [address] [place] [address] [phone]   As needed if symptoms worsen    Audelia Hives, MD 09/12/12 [phone]

## 2012-09-14 ENCOUNTER — Encounter (HOSPITAL_COMMUNITY): Admission: EM | Disposition: A | Discharge status: Home / Self Care | Payer: Self-pay | Source: Home / Self Care

## 2012-09-14 ENCOUNTER — Emergency Department (HOSPITAL_COMMUNITY)
Admission: EM | Admit: 2012-09-14 | Discharge: 2012-09-14 | Disposition: A | Discharge status: Home / Self Care | Payer: 59 | Attending: Urology | Admitting: Urology

## 2012-09-14 ENCOUNTER — Encounter (HOSPITAL_COMMUNITY): Payer: Self-pay | Admitting: Certified Registered Nurse Anesthetist

## 2012-09-14 ENCOUNTER — Encounter (HOSPITAL_COMMUNITY): Payer: Self-pay | Admitting: Urology

## 2012-09-14 ENCOUNTER — Emergency Department (HOSPITAL_COMMUNITY): Payer: 59 | Admitting: Certified Registered Nurse Anesthetist

## 2012-09-14 DIAGNOSIS — N201 Calculus of ureter: Secondary | ICD-10-CM | POA: Diagnosis present

## 2012-09-14 DIAGNOSIS — N200 Calculus of kidney: Secondary | ICD-10-CM | POA: Insufficient documentation

## 2012-09-14 HISTORY — DX: Calculus of ureter: N20.1

## 2012-09-14 HISTORY — DX: Calculus of kidney: N20.0

## 2012-09-14 HISTORY — PX: CYSTOSCOPY WITH URETEROSCOPY: SHX5123

## 2012-09-14 LAB — URINE CULTURE: Culture: NO GROWTH

## 2012-09-14 LAB — URINALYSIS, ROUTINE W REFLEX MICROSCOPIC
Nitrite: NEGATIVE
Specific Gravity, Urine: 1.016 (ref 1.005–1.030)
Urobilinogen, UA: 0.2 mg/dL (ref 0.0–1.0)
pH: 6 (ref 5.0–8.0)

## 2012-09-14 SURGERY — CYSTOSCOPY WITH URETEROSCOPY
Anesthesia: General | Wound class: Clean Contaminated

## 2012-09-14 MED ORDER — OXYCODONE-ACETAMINOPHEN 5-325 MG PO TABS
ORAL_TABLET | ORAL | Status: AC
  Administered 2012-09-14: 1
  Filled 2012-09-14: qty 1

## 2012-09-14 MED ORDER — METOCLOPRAMIDE HCL 5 MG/ML IJ SOLN
INTRAMUSCULAR | Status: DC | PRN
  Administered 2012-09-14: 10 mg via INTRAVENOUS

## 2012-09-14 MED ORDER — HYDROMORPHONE BOLUS VIA INFUSION: 1.0000 mg | INTRAVENOUS | Status: DC | PRN

## 2012-09-14 MED ORDER — SCOPOLAMINE 1 MG/3DAYS TD PT72
MEDICATED_PATCH | TRANSDERMAL | Status: DC | PRN
  Administered 2012-09-14: 1 via TRANSDERMAL

## 2012-09-14 MED ORDER — HYOSCYAMINE SULFATE 0.125 MG SL SUBL: 0.1250 mg | SUBLINGUAL_TABLET | SUBLINGUAL | Status: DC | PRN

## 2012-09-14 MED ORDER — OXYCODONE-ACETAMINOPHEN 5-325 MG PO TABS: 2.0000 | ORAL_TABLET | ORAL | Status: DC | PRN

## 2012-09-14 MED ORDER — DIPHENHYDRAMINE HCL 50 MG/ML IJ SOLN
INTRAMUSCULAR | Status: AC
  Filled 2012-09-14: qty 1

## 2012-09-14 MED ORDER — LIDOCAINE HCL (CARDIAC) 20 MG/ML IV SOLN
INTRAVENOUS | Status: DC | PRN
  Administered 2012-09-14: 100 mg via INTRAVENOUS

## 2012-09-14 MED ORDER — HYOSCYAMINE SULFATE 0.125 MG SL SUBL
0.1250 mg | SUBLINGUAL_TABLET | Freq: Once | SUBLINGUAL | Status: AC
  Administered 2012-09-14: 0.125 mg via ORAL
  Filled 2012-09-14: qty 1

## 2012-09-14 MED ORDER — DIPHENHYDRAMINE HCL 50 MG/ML IJ SOLN
12.5000 mg | Freq: Once | INTRAMUSCULAR | Status: AC
  Administered 2012-09-14: 12.5 mg via INTRAVENOUS

## 2012-09-14 MED ORDER — LACTATED RINGERS IV SOLN
INTRAVENOUS | Status: DC | PRN
  Administered 2012-09-14: 20:00:00 via INTRAVENOUS

## 2012-09-14 MED ORDER — KETOROLAC TROMETHAMINE 30 MG/ML IJ SOLN
15.0000 mg | Freq: Once | INTRAMUSCULAR | Status: AC | PRN
  Administered 2012-09-14: 30 mg via INTRAVENOUS

## 2012-09-14 MED ORDER — MEPERIDINE HCL 50 MG/ML IJ SOLN: 6.2500 mg | INTRAMUSCULAR | Status: DC | PRN

## 2012-09-14 MED ORDER — HYDROMORPHONE HCL PF 1 MG/ML IJ SOLN
1.0000 mg | INTRAMUSCULAR | Status: DC | PRN
  Administered 2012-09-14 (×2): 1 mg via INTRAVENOUS
  Filled 2012-09-14 (×2): qty 1

## 2012-09-14 MED ORDER — PHENAZOPYRIDINE HCL 200 MG PO TABS
ORAL_TABLET | ORAL | Status: AC
  Administered 2012-09-14: 200 mg via ORAL
  Filled 2012-09-14: qty 1

## 2012-09-14 MED ORDER — IOHEXOL 300 MG/ML  SOLN
INTRAMUSCULAR | Status: AC
  Filled 2012-09-14: qty 1

## 2012-09-14 MED ORDER — HYDROMORPHONE HCL PF 1 MG/ML IJ SOLN
0.2500 mg | INTRAMUSCULAR | Status: DC | PRN
  Administered 2012-09-14 (×2): 0.5 mg via INTRAVENOUS

## 2012-09-14 MED ORDER — DIPHENHYDRAMINE HCL 50 MG/ML IJ SOLN
12.5000 mg | Freq: Once | INTRAMUSCULAR | Status: AC
  Administered 2012-09-14: 12.5 mg via INTRAVENOUS
  Filled 2012-09-14: qty 1

## 2012-09-14 MED ORDER — MIDAZOLAM HCL 5 MG/5ML IJ SOLN
INTRAMUSCULAR | Status: DC | PRN
  Administered 2012-09-14: 2 mg via INTRAVENOUS

## 2012-09-14 MED ORDER — PROMETHAZINE HCL 25 MG/ML IJ SOLN: 6.2500 mg | INTRAMUSCULAR | Status: DC | PRN

## 2012-09-14 MED ORDER — PHENAZOPYRIDINE HCL 200 MG PO TABS: 200.0000 mg | ORAL_TABLET | Freq: Three times a day (TID) | ORAL | Status: DC | PRN

## 2012-09-14 MED ORDER — MIDAZOLAM HCL 2 MG/2ML IJ SOLN: 0.5000 mg | Freq: Once | INTRAMUSCULAR | Status: DC | PRN

## 2012-09-14 MED ORDER — IOHEXOL 300 MG/ML  SOLN
INTRAMUSCULAR | Status: DC | PRN
  Administered 2012-09-14: 50 mL via URETHRAL

## 2012-09-14 MED ORDER — HYDROMORPHONE HCL PF 1 MG/ML IJ SOLN
INTRAMUSCULAR | Status: DC | PRN
  Administered 2012-09-14 (×4): 0.5 mg via INTRAVENOUS

## 2012-09-14 MED ORDER — PROPOFOL 10 MG/ML IV BOLUS
INTRAVENOUS | Status: DC | PRN
  Administered 2012-09-14: 200 mg via INTRAVENOUS

## 2012-09-14 MED ORDER — CEFAZOLIN SODIUM-DEXTROSE 2-3 GM-% IV SOLR: 2.0000 g | INTRAVENOUS | Status: DC

## 2012-09-14 MED ORDER — FENTANYL CITRATE 0.05 MG/ML IJ SOLN: 25.0000 ug | INTRAMUSCULAR | Status: DC | PRN

## 2012-09-14 MED ORDER — SCOPOLAMINE 1 MG/3DAYS TD PT72
MEDICATED_PATCH | TRANSDERMAL | Status: AC
  Filled 2012-09-14: qty 1

## 2012-09-14 MED ORDER — KETOROLAC TROMETHAMINE 30 MG/ML IJ SOLN
INTRAMUSCULAR | Status: AC
  Filled 2012-09-14: qty 1

## 2012-09-14 MED ORDER — SODIUM CHLORIDE 0.9 % IV SOLN
INTRAVENOUS | Status: DC
  Administered 2012-09-14: 15:00:00 via INTRAVENOUS

## 2012-09-14 MED ORDER — ONDANSETRON HCL 4 MG/2ML IJ SOLN
INTRAMUSCULAR | Status: DC | PRN
  Administered 2012-09-14: 4 mg via INTRAVENOUS

## 2012-09-14 MED ORDER — SODIUM CHLORIDE 0.9 % IR SOLN
Status: DC | PRN
  Administered 2012-09-14: 3000 mL via INTRAVESICAL

## 2012-09-14 MED ORDER — METOCLOPRAMIDE HCL 5 MG/ML IJ SOLN: 10.0000 mg | Freq: Once | INTRAMUSCULAR | Status: DC | PRN

## 2012-09-14 MED ORDER — 0.9 % SODIUM CHLORIDE (POUR BTL) OPTIME
TOPICAL | Status: DC | PRN
  Administered 2012-09-14: 1000 mL

## 2012-09-14 MED ORDER — LACTATED RINGERS IV SOLN: INTRAVENOUS | Status: DC

## 2012-09-14 MED ORDER — DEXAMETHASONE SODIUM PHOSPHATE 10 MG/ML IJ SOLN
INTRAMUSCULAR | Status: DC | PRN
  Administered 2012-09-14: 10 mg via INTRAVENOUS

## 2012-09-14 MED ORDER — HYDROMORPHONE HCL PF 1 MG/ML IJ SOLN
INTRAMUSCULAR | Status: AC
  Filled 2012-09-14: qty 1

## 2012-09-14 MED ORDER — ONDANSETRON HCL 4 MG/2ML IJ SOLN
4.0000 mg | Freq: Four times a day (QID) | INTRAMUSCULAR | Status: DC | PRN
Start: 2012-09-14 — End: 2012-09-15

## 2012-09-14 MED ORDER — PROMETHAZINE HCL 25 MG/ML IJ SOLN
6.2500 mg | INTRAMUSCULAR | Status: DC | PRN
  Filled 2012-09-14: qty 1

## 2012-09-14 MED ORDER — CEFAZOLIN SODIUM-DEXTROSE 2-3 GM-% IV SOLR
INTRAVENOUS | Status: AC
  Filled 2012-09-14: qty 50

## 2012-09-14 MED ORDER — NALBUPHINE HCL 10 MG/ML IJ SOLN
2.5000 mg | INTRAMUSCULAR | Status: DC | PRN
  Filled 2012-09-14: qty 0.25

## 2012-09-14 MED ORDER — CEFAZOLIN SODIUM-DEXTROSE 2-3 GM-% IV SOLR
INTRAVENOUS | Status: DC | PRN
  Administered 2012-09-14: 2 g via INTRAVENOUS

## 2012-09-14 SURGICAL SUPPLY — 17 items
BAG URO CATCHER STRL LF (DRAPE) ×2 IMPLANT
BASKET LASER NITINOL 1.9FR (BASKET) ×2 IMPLANT
BASKET ZERO TIP NITINOL 2.4FR (BASKET) ×1 IMPLANT
BSKT STON RTRVL 120 1.9FR (BASKET) ×1
BSKT STON RTRVL ZERO TP 2.4FR (BASKET) ×1
CATH URET 5FR 28IN OPEN ENDED (CATHETERS) ×2 IMPLANT
CLOTH BEACON ORANGE TIMEOUT ST (SAFETY) ×2 IMPLANT
DRAPE CAMERA CLOSED 9X96 (DRAPES) ×2 IMPLANT
GLOVE SURG SS PI 8.0 STRL IVOR (GLOVE) ×2 IMPLANT
GOWN PREVENTION PLUS XLARGE (GOWN DISPOSABLE) ×2 IMPLANT
GOWN STRL REIN XL XLG (GOWN DISPOSABLE) ×2 IMPLANT
MANIFOLD NEPTUNE II (INSTRUMENTS) ×2 IMPLANT
MARKER SKIN DUAL TIP RULER LAB (MISCELLANEOUS) ×2 IMPLANT
PACK CYSTO (CUSTOM PROCEDURE TRAY) ×2 IMPLANT
SHEATH ACCESS URETERAL 38CM (SHEATH) ×1 IMPLANT
STENT CONTOUR 6FRX24X.038 (STENTS) ×1 IMPLANT
TUBING CONNECTING 10 (TUBING) ×2 IMPLANT

## 2012-09-14 NOTE — ED Notes (Signed)
Dr. Annabell Howells phones prior to her arrival to inform us he plans to take pt. To O.R. For procedure.

## 2012-09-14 NOTE — Anesthesia Postprocedure Evaluation (Signed)
  Anesthesia Post-op Note  Patient: [name]  Procedure(s) Performed: Procedure(s): CYSTOSCOPY WITH URETEROSCOPY, with  stent placement (N/A)  Patient Location: PACU  Anesthesia Type:General  Level of Consciousness: awake, alert  and oriented  Airway and Oxygen Therapy: Patient Spontanous Breathing  Post-op Pain: mild  Post-op Assessment: Post-op Vital signs reviewed, Patient's Cardiovascular Status Stable, Respiratory Function Stable, Patent Airway, No signs of Nausea or vomiting and Pain level controlled  Post-op Vital Signs: Reviewed and stable  Complications: No apparent anesthesia complications

## 2012-09-14 NOTE — Op Note (Signed)
NAMETANIS, HENSARLING            ACCOUNT NO.:  [id number]  MEDICAL RECORD NO.:  [id number]  LOCATION:  [facility]                         FACILITY:  [facility]  PHYSICIAN:  Excell Seltzer. Annabell Howells, M.D.    DATE OF BIRTH:  02-Jan-1966  DATE OF PROCEDURE: DATE OF DISCHARGE:                              OPERATIVE REPORT   PROCEDURE: 1. Cystoscopy, right retrograde pyelogram with interpretation. 2. Right ureteroscopic stone extraction of right proximal and right     lower poles, right proximal ureteral and right lower pole renal     stones. 3. Placement of right double-J stent. 4. Ureteroscopic extraction of a right upper pole stone.  PREOPERATIVE DIAGNOSIS:  Right proximal ureteral stone.  POSTOPERATIVE DIAGNOSIS:  Right proximal ureteral stone with a right renal lower pole stone.  Right upper pole stone.  SURGEON:  Excell Seltzer. Annabell Howells, M.D.  ANESTHESIA:  General.  SPECIMEN:  Stones.  DRAINS:  A 6-French 24-cm double-J stent.  BLOOD LOSS:  Minimal.  COMPLICATIONS:  None.  INDICATIONS:  [name] is a 47 year old white female with a history of stones who presented to the ER on the 3rd with right flank pain, was found to have a 3-mm proximal right ureteral stone.  She has continued to have pain and contacted me earlier today to see if ureteroscopy could be performed.  FINDINGS OF PROCEDURE:  She was given Ancef and taken to the operating room where general anesthetic was induced.  She was placed in lithotomy position and fitted with PAS hose.  Her perineum and genitalia were prepped with Betadine solution.  She was draped in usual sterile fashion.  Cystoscopy was performed using the 22-French scope in the 12 degree lens.  Examination revealed a normal urethra.  The bladder wall is smooth and pale without tumor, stones, or inflammation.  Ureteral orifices were unremarkable.  Both were effluxing clear urine.  The right ureteral orifice was cannulated with 5-French opening catheter.  The ureter  appeared unremarkable.  There was a subtle mild dilation of the proximal ureter just above L2 transverse process, and a possible filling defect in this area suggestive of the stone.  At this point, a guidewire was passed to the kidney and the inner core of a 38 cm digital access sheath was passed over the wire to dilate the ureter and this passed easily to the kidney.  A 6.4-French short ureteroscope was then passed alongside the wire.  A stone was noted in the proximal ureter.  A Nitinol basket was passed and the stone was engaged.  Upon removal, the stone was quite small approximately 1-2 mm and not sufficiently large to be consistent with a stone seen on CT.  At this point, the short ureteroscope was passed again all the way to renal pelvis and the stone was not seen.  I then removed the ureteroscope and passed the 14-French 38-cm digital access sheath just below the kidney.  The core and wire were removed and the digital ureteroscope was then passed.  There were no additional stones noted in the ureter.  However, in the lower pole, there appeared to be approximately 3-mm stone that was still adherent to the calyx. This was grasped with a Nitinol basket  and removed without difficulty.  Further inspection revealed a stone fragment in the renal pelvis.  This was felt to be the remnant of the stone from the proximal ureter.  This was also grasped with a basket and removed.  Final inspection of the kidney demonstrated a small mobile stone in the upper pole.  It was felt they should be removed because it was free from the papillary tip and would likely become symptomatic.  This appeared to measure 2-3 mm and was removed as well.  Once the stone had been removed, the guidewire was passed through the access sheath to the kidney.  The access sheath was removed.  The cystoscope was reinserted over the wire and a 6-French 24-cm double-J stent with string was passed over the wire to the  kidney.  The wire was removed leaving good coil in the kidney, a good coil in the bladder. The bladder was drained.  Cystoscope was removed.  The wound was checked stent string exiting the urethra.  It was tied close to the meatus and trimmed in a tampon string fashion.  The patient was taken down from lithotomy position.  Her anesthetic was reversed.  She was moved to recovery room in stable condition.  There were no complications.     Excell Seltzer. Annabell Howells, M.D.     JJW/MEDQ  D:  09/14/2012  T:  09/14/2012  Job:  [id number]

## 2012-09-14 NOTE — Transfer of Care (Signed)
Immediate Anesthesia Transfer of Care Note  Patient: [name]  Procedure(s) Performed: Procedure(s) (LRB): CYSTOSCOPY WITH URETEROSCOPY, with  stent placement (N/A)  Patient Location: PACU  Anesthesia Type: General  Level of Consciousness: sedated, patient cooperative and responds to stimulaton  Airway & Oxygen Therapy: Patient Spontanous Breathing and Patient connected to face mask oxgen  Post-op Assessment: Report given to PACU RN and Post -op Vital signs reviewed and stable  Post vital signs: Reviewed and stable  Complications: No apparent anesthesia complications

## 2012-09-14 NOTE — Anesthesia Preprocedure Evaluation (Addendum)
Anesthesia Evaluation  Patient identified by MRN, date of birth, ID band Patient awake    Reviewed: Allergy & Precautions, H&P , Patient's Chart, lab work & pertinent test results, reviewed documented beta blocker date and time   History of Anesthesia Complications Negative for: history of anesthetic complications  Airway Mallampati: II TM Distance: >3 FB Neck ROM: full    Dental no notable dental hx.    Pulmonary neg pulmonary ROS,  breath sounds clear to auscultation  Pulmonary exam normal       Cardiovascular Exercise Tolerance: Good negative cardio ROS  Rhythm:regular Rate:Normal     Neuro/Psych PSYCHIATRIC DISORDERS negative neurological ROS  negative psych ROS   GI/Hepatic negative GI ROS, Neg liver ROS,   Endo/Other  negative endocrine ROS  Renal/GU Renal diseasenegative Renal ROS     Musculoskeletal   Abdominal   Peds  Hematology negative hematology ROS (+)   Anesthesia Other Findings Diverticulosis     Chronic back pain        ADHD (attention deficit hyperactivity disorder)     Palpitations        Nephrolithiasis     Right ureteral stone 09/14/2012         Reproductive/Obstetrics negative OB ROS                           Anesthesia Physical Anesthesia Plan  ASA: II and emergent  Anesthesia Plan: General ETT   Post-op Pain Management:    Induction: Rapid sequence, Cricoid pressure planned and Intravenous  Airway Management Planned: Oral ETT  Additional Equipment:   Intra-op Plan:   Post-operative Plan:   Informed Consent: I have reviewed the patients History and Physical, chart, labs and discussed the procedure including the risks, benefits and alternatives for the proposed anesthesia with the patient or authorized representative who has indicated his/her understanding and acceptance.   Dental Advisory Given  Plan Discussed with: CRNA and Surgeon  Anesthesia Plan  Comments:        Anesthesia Quick Evaluation

## 2012-09-14 NOTE — Brief Op Note (Signed)
09/14/2012  8:46 PM  PATIENT:  [name]  47 y.o. female  PRE-OPERATIVE DIAGNOSIS:  Right proximal ureteral stone  POST-OPERATIVE DIAGNOSIS:  Right ureteral proximal stone, right upper and right lower pole stone  PROCEDURE:  Procedure(s): CYSTOSCOPY WITH retrograde pyelography, URETEROSCOPY with removal of right proximal ureteral and right upper pole and lower pole renal stones, with  stent placement (N/A)  SURGEON:  Surgeon(s) and Role:    * Anner Crete, MD - Primary  PHYSICIAN ASSISTANT:   ASSISTANTS: none   ANESTHESIA:   general  EBL:     BLOOD ADMINISTERED:none  DRAINS: 6x24 right JJ stent/    LOCAL MEDICATIONS USED:  NONE  SPECIMEN:  Source of Specimen:  stone  DISPOSITION OF SPECIMEN:  to family to bring for analysis  COUNTS:  YES  TOURNIQUET:  * No tourniquets in log *  DICTATION: .Other Dictation: Dictation Number [id number]  PLAN OF CARE: Discharge to home after PACU  PATIENT DISPOSITION:  PACU - hemodynamically stable.   Delay start of Pharmacological VTE agent (>24hrs) due to surgical blood loss or risk of bleeding: not applicable

## 2012-09-14 NOTE — Preoperative (Signed)
Beta Blockers   Reason not to administer Beta Blockers:Not Applicable Pt took Beta Blocker this morning 09-14-12

## 2012-09-14 NOTE — ED Notes (Signed)
Pt was diagnosised with right kidney stones x 3 that has passed to the ureter, refer by Dr. Wilson Singer to the ED and possibly go to OR for Kidney stone removal. Dr. Annabell Howells is currently in room evaluating this patient.

## 2012-09-14 NOTE — H&P (Signed)
Subjective: [name] is a 47 yo WF with a history of stones who had malaise on Monday and then had the onset of sharp pain in the RLQ with radiation into the leg and buttock.   She was seen in the ER and a CT showed a 3mm right proximal stone.  There was mild hydro and a small right renal stone.    She was placed on keflex, flomax and percocet in the ER.   She had some flushing and chills with a low grade fever to 99.9 last night.   Her UA had a few WBC with TNTC RBC's but was nit - and her culture was negative.    She continues to have some pain today that is intermittant but controlled with percocet.  It is 4/10 now but it is worse with movement.  She is confident that the stone hasn't passed.  ROS: Negative except as above and as noted.  She has no constipation.   She had nausea this morning and is taking zofran.  She has no CP or SOB.   She has chronic back pain and chronic LLQ pain that came with a bout of c. diff.    Past Medical History  Diagnosis Date  . Diverticulosis   . Chronic back pain   . ADHD (attention deficit hyperactivity disorder)   . Palpitations   . Nephrolithiasis   . Right ureteral stone 09/14/2012   Past Surgical History  Procedure Laterality Date  . Partial colectomy    . Cystoscopy with retrograde pyelogram, ureteroscopy and stent placement    . Cystoscopy with retrograde pyelogram, ureteroscopy and stent placement    . Cystoscopy with retrograde pyelogram, ureteroscopy and stent placement    . Cholecystectomy    . Anterior cruciate ligament repair    . Anterior cruciate ligament repair    . Wrist surgery     History   Social History  . Marital Status: Married    Spouse Name: N/A    Number of Children: N/A  . Years of Education: N/A   Occupational History  . Not on file.   Social History Main Topics  . Smoking status: Never Smoker   . Smokeless tobacco: Not on file  . Alcohol Use: 0 - .5 oz/week    0-1 drink(s) per week  . Drug Use: No  .  Sexually Active: Not on file   Other Topics Concern  . Not on file   Social History Narrative  . No narrative on file  non-smoker.  Rare alcohol.   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   Family History  Problem Relation Age of Onset  . Osteoarthritis Mother   . Osteoarthritis Maternal Grandmother   . Osteoarthritis Maternal Grandfather    Allergies  Allergen Reactions  . Ciprofloxacin Other (See Comments)    Joint inflammation  . Flagyl (Metronidazole) Other (See Comments)    Doesn't remember reaction but should not take  . Ivp Dye (Iodinated Diagnostic Agents) Hives  . Other Itching    Narcotic pain medicines cause itching but can be managed with benadryl      Objective: Vital signs in last 24 hours: Temp:  [98 F (36.7 C)] 98 F (36.7 C) (07/06 1417) Pulse Rate:  [86] 86 (07/06 1417) Resp:  [17] 17 (07/06 1417) BP: (156)/(91) 156/91 mmHg (07/06 1417) SpO2:  [96 %] 96 % (07/06 1417)  Intake/Output from previous day:   Intake/Output this shift:    General  appearance: alert, no distress and mildly obese Head: Normocephalic, without obvious abnormality, atraumatic Neck: no adenopathy, no carotid bruit, no JVD, supple, symmetrical, trachea midline and thyroid not enlarged, symmetric, no tenderness/mass/nodules Resp: clear to auscultation bilaterally Cardio: regular rate and rhythm GI: soft with moderate RUQ and RCVAT along with mild RLQ and LLQ tenderness.  No mass, hsm or hernia noted.   +BS.  Extremities: extremities normal, atraumatic, no cyanosis or edema Skin: Skin color, texture, turgor normal. No rashes or lesions Lymph nodes: nl inguinal and supraclavicular nodes.  Neurologic: Alert and oriented X 3, normal strength and tone. Normal symmetric reflexes. Normal coordination and gait  Lab Results:   Recent Labs  09/11/12 1606  WBC 9.4  HGB 14.2  HCT 40.2  PLT 275   BMET  Recent Labs  09/11/12 1606  NA 141  K 3.7  CL 102  CO2 26   GLUCOSE 103*  BUN 15  CREATININE 0.84  CALCIUM 9.8   PT/INR No results found for this basename: LABPROT, INR,  in the last 72 hours ABG No results found for this basename: PHART, PCO2, PO2, HCO3,  in the last 72 hours  Studies/Results: No results found.  Anti-infectives: Anti-infectives   None      No current facility-administered medications for this encounter.   Current Outpatient Prescriptions  Medication Sig Dispense Refill  . acetaminophen (TYLENOL) 650 MG CR tablet Take 1,300 mg by mouth daily as needed for pain.      Marland Kitchen albuterol (PROVENTIL HFA;VENTOLIN HFA) 108 (90 BASE) MCG/ACT inhaler Inhale 2 puffs into the lungs every 4 (four) hours as needed (for bronchitis).      Marland Kitchen amphetamine-dextroamphetamine (ADDERALL XR) 20 MG 24 hr capsule Take 20 mg by mouth daily.      . cephALEXin (KEFLEX) 500 MG capsule Take 1 capsule (500 mg total) by mouth 4 (four) times daily.  40 capsule  0  . dicyclomine (BENTYL) 10 MG capsule Take 10 mg by mouth 3 (three) times daily.      . hydrocortisone valerate cream (WESTCORT) 0.2 % Apply 1 application topically 4 (four) times daily as needed (for eczema/psoriasis on face).      Marland Kitchen ibuprofen (ADVIL,MOTRIN) 800 MG tablet Take 800 mg by mouth every 8 (eight) hours as needed for pain.      . methocarbamol (ROBAXIN) 500 MG tablet Take 500 mg by mouth at bedtime.      . metoprolol tartrate (LOPRESSOR) 25 MG tablet Take 25 mg by mouth 2 (two) times daily.      Marland Kitchen omeprazole (PRILOSEC) 40 MG capsule Take 40 mg by mouth 2 (two) times daily.      . ondansetron (ZOFRAN ODT) 4 MG disintegrating tablet 4mg  ODT q4 hours prn nausea/vomit  10 tablet  0  . ondansetron (ZOFRAN-ODT) 8 MG disintegrating tablet Take 8 mg by mouth 2 (two) times daily as needed for nausea.      Marland Kitchen oxyCODONE-acetaminophen (PERCOCET) 5-325 MG per tablet Take 2 tablets by mouth every 4 (four) hours as needed for pain.  10 tablet  0  . Probiotic Product (PROBIOTIC PO) Take 1 capsule by mouth  daily. Digestive Advantage      . promethazine (PHENERGAN) 25 MG tablet Take 25 mg by mouth daily as needed for nausea.      . tamsulosin (FLOMAX) 0.4 MG CAPS Take 1 capsule (0.4 mg total) by mouth daily.  30 capsule  0  . traMADol (ULTRAM) 50 MG tablet Take 50-100 mg by  mouth 3 (three) times daily as needed for pain.      . Vitamin D, Ergocalciferol, (DRISDOL) 50000 UNITS CAPS Take 50,000 Units by mouth every 7 (seven) days. On Saturdays      . zolpidem (AMBIEN) 10 MG tablet Take 10 mg by mouth at bedtime as needed for sleep.       I have reviewed her labs and CT from 09/11/12 along with her prior films and office records.   Assessment: s/p Procedure(s): CYSTOSCOPY WITH URETEROSCOPY, with possble stent placement HOLMIUM LASER APPLICATION  She has a symptomatic right proximal stone.     Plan: I am going to set her up for right ureteroscopic stone extraction with possible stent.   I reviewed the risks of bleeding, infection, ureteral injury, need for stent or second procedures, thrombotic events and anesthetic complications.      LOS: 0 days    [name] J 09/14/2012

## 2012-09-15 ENCOUNTER — Encounter (HOSPITAL_COMMUNITY): Payer: Self-pay | Admitting: Urology

## 2012-09-18 ENCOUNTER — Observation Stay (HOSPITAL_COMMUNITY)
Admission: EM | Admit: 2012-09-18 | Discharge: 2012-09-19 | Disposition: A | Discharge status: Home / Self Care | Payer: 59 | Attending: Urology | Admitting: Urology

## 2012-09-18 ENCOUNTER — Encounter (HOSPITAL_COMMUNITY): Payer: Self-pay | Admitting: Emergency Medicine

## 2012-09-18 ENCOUNTER — Emergency Department (HOSPITAL_COMMUNITY): Payer: 59

## 2012-09-18 DIAGNOSIS — N201 Calculus of ureter: Secondary | ICD-10-CM | POA: Insufficient documentation

## 2012-09-18 DIAGNOSIS — R232 Flushing: Secondary | ICD-10-CM | POA: Insufficient documentation

## 2012-09-18 DIAGNOSIS — Z9049 Acquired absence of other specified parts of digestive tract: Secondary | ICD-10-CM | POA: Insufficient documentation

## 2012-09-18 DIAGNOSIS — F909 Attention-deficit hyperactivity disorder, unspecified type: Secondary | ICD-10-CM | POA: Insufficient documentation

## 2012-09-18 DIAGNOSIS — N3289 Other specified disorders of bladder: Secondary | ICD-10-CM | POA: Insufficient documentation

## 2012-09-18 DIAGNOSIS — R110 Nausea: Secondary | ICD-10-CM | POA: Insufficient documentation

## 2012-09-18 DIAGNOSIS — Z79899 Other long term (current) drug therapy: Secondary | ICD-10-CM | POA: Insufficient documentation

## 2012-09-18 DIAGNOSIS — Z22322 Carrier or suspected carrier of Methicillin resistant Staphylococcus aureus: Secondary | ICD-10-CM | POA: Insufficient documentation

## 2012-09-18 DIAGNOSIS — R109 Unspecified abdominal pain: Principal | ICD-10-CM | POA: Insufficient documentation

## 2012-09-18 HISTORY — DX: Nausea with vomiting, unspecified: R11.2

## 2012-09-18 HISTORY — DX: Other specified postprocedural states: Z98.890

## 2012-09-18 LAB — URINALYSIS, ROUTINE W REFLEX MICROSCOPIC
Nitrite: POSITIVE — AB
Specific Gravity, Urine: 1.03 (ref 1.005–1.030)
Urobilinogen, UA: 1 mg/dL (ref 0.0–1.0)
pH: 5.5 (ref 5.0–8.0)

## 2012-09-18 LAB — CBC WITH DIFFERENTIAL/PLATELET
Hemoglobin: 13.8 g/dL (ref 12.0–15.0)
Lymphocytes Relative: 14 % (ref 12–46)
Lymphs Abs: 1.7 10*3/uL (ref 0.7–4.0)
MCH: 30.5 pg (ref 26.0–34.0)
MCV: 88.1 fL (ref 78.0–100.0)
Monocytes Relative: 6 % (ref 3–12)
Neutrophils Relative %: 80 % — ABNORMAL HIGH (ref 43–77)
Platelets: 259 10*3/uL (ref 150–400)
RBC: 4.52 MIL/uL (ref 3.87–5.11)
WBC: 12.5 10*3/uL — ABNORMAL HIGH (ref 4.0–10.5)

## 2012-09-18 LAB — COMPREHENSIVE METABOLIC PANEL
ALT: 31 U/L (ref 0–35)
Alkaline Phosphatase: 80 U/L (ref 39–117)
BUN: 14 mg/dL (ref 6–23)
CO2: 28 mEq/L (ref 19–32)
GFR calc Af Amer: 81 mL/min — ABNORMAL LOW (ref >90)
GFR calc non Af Amer: 70 mL/min — ABNORMAL LOW (ref >90)
Glucose, Bld: 123 mg/dL — ABNORMAL HIGH (ref 70–99)
Potassium: 3.5 mEq/L (ref 3.5–5.1)
Sodium: 137 mEq/L (ref 135–145)
Total Bilirubin: 0.3 mg/dL (ref 0.3–1.2)

## 2012-09-18 LAB — MRSA PCR SCREENING: MRSA by PCR: POSITIVE — AB

## 2012-09-18 LAB — URINE MICROSCOPIC-ADD ON

## 2012-09-18 MED ORDER — ONDANSETRON 8 MG PO TBDP
8.0000 mg | ORAL_TABLET | Freq: Two times a day (BID) | ORAL | Status: DC | PRN
  Filled 2012-09-18: qty 1

## 2012-09-18 MED ORDER — ZOLPIDEM TARTRATE 5 MG PO TABS
5.0000 mg | ORAL_TABLET | Freq: Every evening | ORAL | Status: DC | PRN
  Filled 2012-09-18: qty 1

## 2012-09-18 MED ORDER — PROMETHAZINE HCL 25 MG PO TABS: 25.0000 mg | ORAL_TABLET | Freq: Every day | ORAL | Status: DC | PRN

## 2012-09-18 MED ORDER — MUPIROCIN 2 % EX OINT
1.0000 "application " | TOPICAL_OINTMENT | Freq: Two times a day (BID) | CUTANEOUS | Status: DC
  Administered 2012-09-18 (×2): 1 via NASAL

## 2012-09-18 MED ORDER — OXYCODONE-ACETAMINOPHEN 5-325 MG PO TABS
1.0000 | ORAL_TABLET | ORAL | Status: DC | PRN
  Administered 2012-09-18 – 2012-09-19 (×3): 2 via ORAL
  Filled 2012-09-18: qty 2
  Filled 2012-09-18: qty 1
  Filled 2012-09-18: qty 2
  Filled 2012-09-18: qty 1

## 2012-09-18 MED ORDER — ACETAMINOPHEN 10 MG/ML IV SOLN
1000.0000 mg | Freq: Once | INTRAVENOUS | Status: AC
  Administered 2012-09-18: 1000 mg via INTRAVENOUS
  Filled 2012-09-18: qty 100

## 2012-09-18 MED ORDER — ACETAMINOPHEN 325 MG PO TABS: 650.0000 mg | ORAL_TABLET | ORAL | Status: DC | PRN

## 2012-09-18 MED ORDER — MUPIROCIN 2 % EX OINT
TOPICAL_OINTMENT | CUTANEOUS | Status: AC
  Administered 2012-09-18: 17:00:00
  Filled 2012-09-18: qty 22

## 2012-09-18 MED ORDER — ONDANSETRON HCL 4 MG/2ML IJ SOLN
4.0000 mg | INTRAMUSCULAR | Status: DC | PRN
  Administered 2012-09-19: 4 mg via INTRAVENOUS
  Filled 2012-09-18: qty 2

## 2012-09-18 MED ORDER — ZOLPIDEM TARTRATE 10 MG PO TABS
5.0000 mg | ORAL_TABLET | Freq: Every evening | ORAL | Status: DC | PRN
  Administered 2012-09-18: 5 mg via ORAL

## 2012-09-18 MED ORDER — NON FORMULARY: 40.0000 mg | Freq: Two times a day (BID) | Status: DC

## 2012-09-18 MED ORDER — DIPHENHYDRAMINE HCL 25 MG PO CAPS
25.0000 mg | ORAL_CAPSULE | Freq: Four times a day (QID) | ORAL | Status: DC | PRN
  Administered 2012-09-18 (×2): 25 mg via ORAL
  Filled 2012-09-18 (×2): qty 1

## 2012-09-18 MED ORDER — HYDROMORPHONE HCL PF 1 MG/ML IJ SOLN
1.0000 mg | Freq: Once | INTRAMUSCULAR | Status: AC
  Administered 2012-09-18: 1 mg via INTRAVENOUS
  Filled 2012-09-18: qty 1

## 2012-09-18 MED ORDER — POTASSIUM CHLORIDE IN NACL 20-0.45 MEQ/L-% IV SOLN
INTRAVENOUS | Status: DC
  Administered 2012-09-18 – 2012-09-19 (×2): via INTRAVENOUS
  Filled 2012-09-18 (×3): qty 1000

## 2012-09-18 MED ORDER — METOPROLOL TARTRATE 25 MG PO TABS
25.0000 mg | ORAL_TABLET | Freq: Two times a day (BID) | ORAL | Status: DC
  Administered 2012-09-18 (×2): 25 mg via ORAL
  Filled 2012-09-18 (×4): qty 1

## 2012-09-18 MED ORDER — HYOSCYAMINE SULFATE 0.125 MG SL SUBL
0.1250 mg | SUBLINGUAL_TABLET | SUBLINGUAL | Status: DC | PRN
  Filled 2012-09-18: qty 1

## 2012-09-18 MED ORDER — SODIUM CHLORIDE 0.9 % IV SOLN
Freq: Once | INTRAVENOUS | Status: AC
  Administered 2012-09-18: 06:00:00 via INTRAVENOUS

## 2012-09-18 MED ORDER — KETOROLAC TROMETHAMINE 30 MG/ML IJ SOLN
30.0000 mg | Freq: Once | INTRAMUSCULAR | Status: AC
  Administered 2012-09-18: 30 mg via INTRAVENOUS
  Filled 2012-09-18: qty 1

## 2012-09-18 MED ORDER — HYOSCYAMINE SULFATE 0.125 MG SL SUBL
0.1250 mg | SUBLINGUAL_TABLET | SUBLINGUAL | Status: DC | PRN
  Administered 2012-09-19: 0.125 mg via SUBLINGUAL
  Filled 2012-09-18: qty 1

## 2012-09-18 MED ORDER — OMEPRAZOLE 20 MG PO CPDR
40.0000 mg | DELAYED_RELEASE_CAPSULE | Freq: Two times a day (BID) | ORAL | Status: DC
  Administered 2012-09-18 – 2012-09-19 (×2): 40 mg via ORAL
  Filled 2012-09-18 (×4): qty 2

## 2012-09-18 MED ORDER — METHOCARBAMOL 500 MG PO TABS
500.0000 mg | ORAL_TABLET | Freq: Every day | ORAL | Status: DC
  Administered 2012-09-18: 500 mg via ORAL
  Filled 2012-09-18 (×2): qty 1

## 2012-09-18 MED ORDER — ALBUTEROL SULFATE HFA 108 (90 BASE) MCG/ACT IN AERS: 2.0000 | INHALATION_SPRAY | RESPIRATORY_TRACT | Status: DC | PRN

## 2012-09-18 MED ORDER — PANTOPRAZOLE SODIUM 40 MG PO TBEC
40.0000 mg | DELAYED_RELEASE_TABLET | Freq: Every day | ORAL | Status: DC
  Administered 2012-09-18: 40 mg via ORAL
  Filled 2012-09-18: qty 1

## 2012-09-18 MED ORDER — HYDROMORPHONE HCL PF 1 MG/ML IJ SOLN
0.5000 mg | INTRAMUSCULAR | Status: DC | PRN
  Administered 2012-09-18 – 2012-09-19 (×4): 1 mg via INTRAVENOUS
  Filled 2012-09-18 (×4): qty 1

## 2012-09-18 MED ORDER — PHENAZOPYRIDINE HCL 200 MG PO TABS
200.0000 mg | ORAL_TABLET | Freq: Three times a day (TID) | ORAL | Status: DC | PRN
  Filled 2012-09-18: qty 1

## 2012-09-18 MED ORDER — DICYCLOMINE HCL 10 MG PO CAPS
10.0000 mg | ORAL_CAPSULE | Freq: Three times a day (TID) | ORAL | Status: DC
  Administered 2012-09-18 (×2): 10 mg via ORAL
  Filled 2012-09-18 (×5): qty 1

## 2012-09-18 MED ORDER — AMPHETAMINE-DEXTROAMPHET ER 20 MG PO CP24
20.0000 mg | ORAL_CAPSULE | Freq: Every day | ORAL | Status: DC
  Administered 2012-09-19: 20 mg via ORAL
  Filled 2012-09-18 (×2): qty 1

## 2012-09-18 MED ORDER — DOCUSATE SODIUM 100 MG PO CAPS
100.0000 mg | ORAL_CAPSULE | Freq: Two times a day (BID) | ORAL | Status: DC
  Administered 2012-09-18: 100 mg via ORAL
  Filled 2012-09-18 (×3): qty 1

## 2012-09-18 MED ORDER — HYDROCORTISONE VALERATE 0.2 % EX CREA: 1.0000 "application " | TOPICAL_CREAM | Freq: Four times a day (QID) | CUTANEOUS | Status: DC | PRN

## 2012-09-18 MED ORDER — CHLORHEXIDINE GLUCONATE CLOTH 2 % EX PADS
6.0000 | MEDICATED_PAD | Freq: Every day | CUTANEOUS | Status: DC
  Administered 2012-09-18 – 2012-09-19 (×2): 6 via TOPICAL

## 2012-09-18 MED ORDER — DIPHENHYDRAMINE HCL 50 MG/ML IJ SOLN
12.5000 mg | Freq: Once | INTRAMUSCULAR | Status: AC
  Administered 2012-09-18: 12.5 mg via INTRAVENOUS
  Filled 2012-09-18: qty 1

## 2012-09-18 MED ORDER — ONDANSETRON HCL 4 MG/2ML IJ SOLN
4.0000 mg | Freq: Once | INTRAMUSCULAR | Status: AC
  Administered 2012-09-18: 4 mg via INTRAVENOUS
  Filled 2012-09-18: qty 2

## 2012-09-18 MED ORDER — TAMSULOSIN HCL 0.4 MG PO CAPS
0.4000 mg | ORAL_CAPSULE | Freq: Every day | ORAL | Status: DC
  Administered 2012-09-18: 0.4 mg via ORAL
  Filled 2012-09-18 (×2): qty 1

## 2012-09-18 MED ORDER — FENTANYL CITRATE 0.05 MG/ML IJ SOLN
50.0000 ug | Freq: Once | INTRAMUSCULAR | Status: AC
  Administered 2012-09-18: 50 ug via INTRAVENOUS
  Filled 2012-09-18: qty 2

## 2012-09-18 NOTE — ED Notes (Signed)
PTAR at bedside 

## 2012-09-18 NOTE — Care Management Note (Addendum)
    Page 1 of 1   09/19/2012     12:03:38 PM   CARE MANAGEMENT NOTE 09/19/2012  Patient:  [name], [name]   Account Number:  [id number]  Date Initiated:  09/18/2012  Documentation initiated by:  Lanier Clam  Subjective/Objective Assessment:   ADMITTED Verita Schneiders.S/P STONEEXTRACTION,& STENT REMOVAL 09/14/12.     Action/Plan:   FROM HOME.HAS PCP,PHARMACY.   Anticipated DC Date:  09/19/2012   Anticipated DC Plan:  HOME/SELF CARE      DC Planning Services  CM consult      Choice offered to / List presented to:             Status of service:  Completed, signed off Medicare Important Message given?   (If response is "NO", the following Medicare IM given date fields will be blank) Date Medicare IM given:   Date Additional Medicare IM given:    Discharge Disposition:  HOME/SELF CARE  Per UR Regulation:  Reviewed for med. necessity/level of care/duration of stay  If discussed at Long Length of Stay Meetings, dates discussed:    Comments:  09/18/12 [facility] RN,BSN NCM [phone]

## 2012-09-18 NOTE — Progress Notes (Signed)
Called to get report at 1215 and Diplomatic Services operational officer stated RN would call back because she was in a patient's room. Called back at 1235 and remained on hold for 5 minutes. Per Dr. Belva Crome note, patient is to stay in ED for 2-3 hours to see if IV Tylenol works. Will wait for RN call to see if patient will be admitted or d/c from ED.

## 2012-09-18 NOTE — H&P (Signed)
Subjective: [name] had a right ureteroscopic stone extraction on Sunday and pulled her stent at 2am.  Since that time she has had recurrent colic with severe pain and nausea.   She has had toradol and dilaudid in the ER with only intermittant pain relief.  She is doing better now but is only an hour out from dosing.  Her pain is worse with voiding and she has had some clots.  ROS: Negative except as above and flushing with the pain.  She has had no fever or vomiting.     Past Medical History  Diagnosis Date  . Diverticulosis   . Chronic back pain   . ADHD (attention deficit hyperactivity disorder)   . Palpitations   . Nephrolithiasis   . Right ureteral stone 09/14/2012   Past Surgical History  Procedure Laterality Date  . Partial colectomy    . Cystoscopy with retrograde pyelogram, ureteroscopy and stent placement    . Cystoscopy with retrograde pyelogram, ureteroscopy and stent placement    . Cystoscopy with retrograde pyelogram, ureteroscopy and stent placement    . Cholecystectomy    . Anterior cruciate ligament repair    . Anterior cruciate ligament repair    . Wrist surgery    . Cystoscopy with ureteroscopy N/A 09/14/2012    Procedure: CYSTOSCOPY WITH URETEROSCOPY, with  stent placement;  Surgeon: Anner Crete, MD;  Location: [facility];  Service: Urology;  Laterality: N/A;   Allergies  Allergen Reactions  . Ciprofloxacin Other (See Comments)    Joint inflammation  . Flagyl (Metronidazole) Other (See Comments)    Doesn't remember reaction but should not take  . Ivp Dye (Iodinated Diagnostic Agents) Hives  . Other Itching    Narcotic pain medicines cause itching but can be managed with benadryl   History   Social History  . Marital Status: Married    Spouse Name: N/A    Number of Children: N/A  . Years of Education: N/A   Occupational History  . Not on file.   Social History Main Topics  . Smoking status: Never Smoker   . Smokeless tobacco: Not on file  .  Alcohol Use: 0 - .5 oz/week    0-1 drink(s) per week  . Drug Use: No  . Sexually Active: Not on file   Other Topics Concern  . Not on file   Social History Narrative  . No narrative on file   Family History  Problem Relation Age of Onset  . Osteoarthritis Mother   . Osteoarthritis Maternal Grandmother   . Osteoarthritis Maternal Grandfather     No current facility-administered medications on file prior to encounter.   Current Outpatient Prescriptions on File Prior to Encounter  Medication Sig Dispense Refill  . acetaminophen (TYLENOL) 650 MG CR tablet Take 1,300 mg by mouth daily as needed for pain.      Marland Kitchen albuterol (PROVENTIL HFA;VENTOLIN HFA) 108 (90 BASE) MCG/ACT inhaler Inhale 2 puffs into the lungs every 4 (four) hours as needed (for bronchitis).      Marland Kitchen amphetamine-dextroamphetamine (ADDERALL XR) 20 MG 24 hr capsule Take 20 mg by mouth daily.      Marland Kitchen dicyclomine (BENTYL) 10 MG capsule Take 10 mg by mouth 3 (three) times daily.      . hyoscyamine (LEVSIN/SL) 0.125 MG SL tablet Place 1 tablet (0.125 mg total) under the tongue every 4 (four) hours as needed for cramping.  30 tablet  0  . ibuprofen (ADVIL,MOTRIN) 800 MG tablet  Take 800 mg by mouth every 8 (eight) hours as needed for pain.      . methocarbamol (ROBAXIN) 500 MG tablet Take 500 mg by mouth at bedtime.      . metoprolol tartrate (LOPRESSOR) 25 MG tablet Take 25 mg by mouth 2 (two) times daily.      Marland Kitchen omeprazole (PRILOSEC) 40 MG capsule Take 40 mg by mouth 2 (two) times daily.      . ondansetron (ZOFRAN ODT) 4 MG disintegrating tablet 4mg  ODT q4 hours prn nausea/vomit  10 tablet  0  . ondansetron (ZOFRAN-ODT) 8 MG disintegrating tablet Take 8 mg by mouth 2 (two) times daily as needed for nausea.      Marland Kitchen oxyCODONE-acetaminophen (PERCOCET) 5-325 MG per tablet Take 2 tablets by mouth every 4 (four) hours as needed for pain.  25 tablet  0  . phenazopyridine (PYRIDIUM) 200 MG tablet Take 1 tablet (200 mg total) by mouth 3  (three) times daily as needed for pain.  30 tablet  0  . Probiotic Product (PROBIOTIC PO) Take 1 capsule by mouth daily. Digestive Advantage      . promethazine (PHENERGAN) 25 MG tablet Take 25 mg by mouth daily as needed for nausea.      . tamsulosin (FLOMAX) 0.4 MG CAPS Take 1 capsule (0.4 mg total) by mouth daily.  30 capsule  0  . Vitamin D, Ergocalciferol, (DRISDOL) 50000 UNITS CAPS Take 50,000 Units by mouth every 7 (seven) days. On Saturdays      . zolpidem (AMBIEN) 10 MG tablet Take 10 mg by mouth at bedtime as needed for sleep.      . hydrocortisone valerate cream (WESTCORT) 0.2 % Apply 1 application topically 4 (four) times daily as needed (for eczema/psoriasis on face).      . traMADol (ULTRAM) 50 MG tablet Take 50-100 mg by mouth 3 (three) times daily as needed for pain.      +  Objective: Vital signs in last 24 hours: Temp:  [99.2 F (37.3 C)] 99.2 F (37.3 C) (07/10 0541) Pulse Rate:  [85-87] 87 (07/10 0959) Resp:  [18-22] 22 (07/10 0959) BP: (127-128)/(75-93) 127/93 mmHg (07/10 0959) SpO2:  [96 %-99 %] 99 % (07/10 0959)  Intake/Output from previous day:   Intake/Output this shift:    General appearance: alert and no distress Resp: clear to auscultation bilaterally Cardio: regular rate and rhythm GI: soft with RUQT and CVAT.   Lab Results:   Recent Labs  09/18/12 0519  WBC 12.5*  HGB 13.8  HCT 39.8  PLT 259   BMET  Recent Labs  09/18/12 0519  NA 137  K 3.5  CL 98  CO2 28  GLUCOSE 123*  BUN 14  CREATININE 0.96  CALCIUM 9.7   PT/INR No results found for this basename: LABPROT, INR,  in the last 72 hours ABG No results found for this basename: PHART, PCO2, PO2, HCO3,  in the last 72 hours  Studies/Results: Dg Abd 1 View  09/18/2012   *RADIOLOGY REPORT*  Clinical Data: Right flank pain.  Previous history of kidney stones.  ABDOMEN - 1 VIEW  Comparison: CT abdomen and pelvis from [facility]07/05/2012.  Findings: Intrarenal and right  ureteral stones seen on previous CT are not demonstrated on plain radiograph.  Gas and stool throughout the colon.  No small or large bowel distension.  Surgical clips in the right upper quadrant.  Mild degenerative changes in the spine.  IMPRESSION: No radiopaque stones are definitely  identified.   Original Report Authenticated By: Burman Nieves, M.D.    I have reviewed her KUB films.  No stones seen.   Anti-infectives: Anti-infectives   None      Current Facility-Administered Medications  Medication Dose Route Frequency Provider Last Rate Last Dose  . 0.45 % NaCl with KCl 20 mEq / L infusion   Intravenous Continuous Anner Crete, MD      . acetaminophen (TYLENOL) tablet 650 mg  650 mg Oral Q4H PRN Anner Crete, MD      . docusate sodium (COLACE) capsule 100 mg  100 mg Oral BID Anner Crete, MD      . HYDROmorphone (DILAUDID) injection 0.5-1 mg  0.5-1 mg Intravenous Q2H PRN Anner Crete, MD      . hyoscyamine (LEVSIN SL) SL tablet 0.125 mg  0.125 mg Oral Q4H PRN Anner Crete, MD      . ondansetron [facility]) injection 4 mg  4 mg Intravenous Q4H PRN Anner Crete, MD      . oxyCODONE-acetaminophen (PERCOCET/ROXICET) 5-325 MG per tablet 1-2 tablet  1-2 tablet Oral Q4H PRN Anner Crete, MD      . zolpidem (AMBIEN) tablet 5 mg  5 mg Oral QHS PRN,MR X 1 Anner Crete, MD       Current Outpatient Prescriptions  Medication Sig Dispense Refill  . acetaminophen (TYLENOL) 650 MG CR tablet Take 1,300 mg by mouth daily as needed for pain.      Marland Kitchen albuterol (PROVENTIL HFA;VENTOLIN HFA) 108 (90 BASE) MCG/ACT inhaler Inhale 2 puffs into the lungs every 4 (four) hours as needed (for bronchitis).      Marland Kitchen amphetamine-dextroamphetamine (ADDERALL XR) 20 MG 24 hr capsule Take 20 mg by mouth daily.      Marland Kitchen dicyclomine (BENTYL) 10 MG capsule Take 10 mg by mouth 3 (three) times daily.      . diphenhydrAMINE (BENADRYL) 25 mg capsule Take 25 mg by mouth every 6 (six) hours as needed for itching.      . hyoscyamine  (LEVSIN/SL) 0.125 MG SL tablet Place 1 tablet (0.125 mg total) under the tongue every 4 (four) hours as needed for cramping.  30 tablet  0  . ibuprofen (ADVIL,MOTRIN) 800 MG tablet Take 800 mg by mouth every 8 (eight) hours as needed for pain.      . methocarbamol (ROBAXIN) 500 MG tablet Take 500 mg by mouth at bedtime.      . metoprolol tartrate (LOPRESSOR) 25 MG tablet Take 25 mg by mouth 2 (two) times daily.      Marland Kitchen omeprazole (PRILOSEC) 40 MG capsule Take 40 mg by mouth 2 (two) times daily.      . ondansetron (ZOFRAN ODT) 4 MG disintegrating tablet 4mg  ODT q4 hours prn nausea/vomit  10 tablet  0  . ondansetron (ZOFRAN-ODT) 8 MG disintegrating tablet Take 8 mg by mouth 2 (two) times daily as needed for nausea.      Marland Kitchen oxyCODONE-acetaminophen (PERCOCET) 5-325 MG per tablet Take 2 tablets by mouth every 4 (four) hours as needed for pain.  25 tablet  0  . phenazopyridine (PYRIDIUM) 200 MG tablet Take 1 tablet (200 mg total) by mouth 3 (three) times daily as needed for pain.  30 tablet  0  . Probiotic Product (PROBIOTIC PO) Take 1 capsule by mouth daily. Digestive Advantage      . promethazine (PHENERGAN) 25 MG tablet Take 25 mg by mouth daily as needed for nausea.      Marland Kitchen  tamsulosin (FLOMAX) 0.4 MG CAPS Take 1 capsule (0.4 mg total) by mouth daily.  30 capsule  0  . Vitamin D, Ergocalciferol, (DRISDOL) 50000 UNITS CAPS Take 50,000 Units by mouth every 7 (seven) days. On Saturdays      . zolpidem (AMBIEN) 10 MG tablet Take 10 mg by mouth at bedtime as needed for sleep.      . hydrocortisone valerate cream (WESTCORT) 0.2 % Apply 1 application topically 4 (four) times daily as needed (for eczema/psoriasis on face).      . traMADol (ULTRAM) 50 MG tablet Take 50-100 mg by mouth 3 (three) times daily as needed for pain.        Assessment: Clot colic post stent removal.  Plan: I will continue to observe her in the ER for 2-3 hrs and if she continues to need pain med I will admit her and get a renal US.      LOS: 0 days    Orson Rho J 09/18/2012

## 2012-09-18 NOTE — Progress Notes (Signed)
Patient's MRSA PCR came back positive. Standing orders initiated. Patient informed.

## 2012-09-18 NOTE — ED Notes (Signed)
Pt presents to ED with c/o severe right flank pain.  Pt reports that she has kidney stones, has had stent place here last Sunday; also reports that 3 stones were removed---states she has 7 stones; pt states that she does not know if she had passed the rest of the stones but she has been having pain and it has gotten "its worst" this morning.

## 2012-09-18 NOTE — Progress Notes (Signed)
Explained importance of SCDs but patient is refusing them at this time. States she has "had a bad experience in the past."

## 2012-09-18 NOTE — ED Provider Notes (Signed)
History    CSN: [id number] Arrival date & time 09/18/12  4098  First MD Initiated Contact with Patient 09/18/12 0615     Chief Complaint  Patient presents with  . Flank Pain   (Consider location/radiation/quality/duration/timing/severity/associated sxs/prior Treatment) HPI Patient presents to the emergency department with right flank and abdominal pain patient recently had a ureteral stent placed and stone removal by Dr. Annabell Howells.  Patient, states she's had worsening pain, despite pain medications.  Patient, states, having discomfort when urinating as well.  Patient denies vomiting, diarrhea, chest pain, shortness of breath, dizziness, weakness, fever, headache, blurred vision, or syncope.  Patient, states, that she removed the stent herself.  Patient, states her symptoms got worse yesterday evening Past Medical History  Diagnosis Date  . Diverticulosis   . Chronic back pain   . ADHD (attention deficit hyperactivity disorder)   . Palpitations   . Nephrolithiasis   . Right ureteral stone 09/14/2012   Past Surgical History  Procedure Laterality Date  . Partial colectomy    . Cystoscopy with retrograde pyelogram, ureteroscopy and stent placement    . Cystoscopy with retrograde pyelogram, ureteroscopy and stent placement    . Cystoscopy with retrograde pyelogram, ureteroscopy and stent placement    . Cholecystectomy    . Anterior cruciate ligament repair    . Anterior cruciate ligament repair    . Wrist surgery    . Cystoscopy with ureteroscopy N/A 09/14/2012    Procedure: CYSTOSCOPY WITH URETEROSCOPY, with  stent placement;  Surgeon: Anner Crete, MD;  Location: [facility];  Service: Urology;  Laterality: N/A;   Family History  Problem Relation Age of Onset  . Osteoarthritis Mother   . Osteoarthritis Maternal Grandmother   . Osteoarthritis Maternal Grandfather    History  Substance Use Topics  . Smoking status: Never Smoker   . Smokeless tobacco: Not on file  . Alcohol Use: 0 - .5  oz/week    0-1 drink(s) per week   OB History   Grav Para Term Preterm Abortions TAB SAB Ect Mult Living   2 2 2             Review of Systems All other systems negative except as documented in the HPI. All pertinent positives and negatives as reviewed in the HPI. Allergies  Ciprofloxacin; Flagyl; Ivp dye; and Other  Home Medications   Current Outpatient Rx  Name  Route  Sig  Dispense  Refill  . acetaminophen (TYLENOL) 650 MG CR tablet   Oral   Take 1,300 mg by mouth daily as needed for pain.         Marland Kitchen albuterol (PROVENTIL HFA;VENTOLIN HFA) 108 (90 BASE) MCG/ACT inhaler   Inhalation   Inhale 2 puffs into the lungs every 4 (four) hours as needed (for bronchitis).         Marland Kitchen amphetamine-dextroamphetamine (ADDERALL XR) 20 MG 24 hr capsule   Oral   Take 20 mg by mouth daily.         Marland Kitchen dicyclomine (BENTYL) 10 MG capsule   Oral   Take 10 mg by mouth 3 (three) times daily.         . diphenhydrAMINE (BENADRYL) 25 mg capsule   Oral   Take 25 mg by mouth every 6 (six) hours as needed for itching.         . hyoscyamine (LEVSIN/SL) 0.125 MG SL tablet   Sublingual   Place 1 tablet (0.125 mg total) under the tongue every 4 (four) hours  as needed for cramping.   30 tablet   0   . ibuprofen (ADVIL,MOTRIN) 800 MG tablet   Oral   Take 800 mg by mouth every 8 (eight) hours as needed for pain.         . methocarbamol (ROBAXIN) 500 MG tablet   Oral   Take 500 mg by mouth at bedtime.         . metoprolol tartrate (LOPRESSOR) 25 MG tablet   Oral   Take 25 mg by mouth 2 (two) times daily.         Marland Kitchen omeprazole (PRILOSEC) 40 MG capsule   Oral   Take 40 mg by mouth 2 (two) times daily.         . ondansetron (ZOFRAN ODT) 4 MG disintegrating tablet      4mg  ODT q4 hours prn nausea/vomit   10 tablet   0   . ondansetron (ZOFRAN-ODT) 8 MG disintegrating tablet   Oral   Take 8 mg by mouth 2 (two) times daily as needed for nausea.         Marland Kitchen oxyCODONE-acetaminophen  (PERCOCET) 5-325 MG per tablet   Oral   Take 2 tablets by mouth every 4 (four) hours as needed for pain.   25 tablet   0   . phenazopyridine (PYRIDIUM) 200 MG tablet   Oral   Take 1 tablet (200 mg total) by mouth 3 (three) times daily as needed for pain.   30 tablet   0   . Probiotic Product (PROBIOTIC PO)   Oral   Take 1 capsule by mouth daily. Digestive Advantage         . promethazine (PHENERGAN) 25 MG tablet   Oral   Take 25 mg by mouth daily as needed for nausea.         . tamsulosin (FLOMAX) 0.4 MG CAPS   Oral   Take 1 capsule (0.4 mg total) by mouth daily.   30 capsule   0   . Vitamin D, Ergocalciferol, (DRISDOL) 50000 UNITS CAPS   Oral   Take 50,000 Units by mouth every 7 (seven) days. On Saturdays         . zolpidem (AMBIEN) 10 MG tablet   Oral   Take 10 mg by mouth at bedtime as needed for sleep.         . hydrocortisone valerate cream (WESTCORT) 0.2 %   Topical   Apply 1 application topically 4 (four) times daily as needed (for eczema/psoriasis on face).         . traMADol (ULTRAM) 50 MG tablet   Oral   Take 50-100 mg by mouth 3 (three) times daily as needed for pain.          BP 128/75  Pulse 85  Temp(Src) 99.2 F (37.3 C) (Oral)  Resp 18  SpO2 96% Physical Exam  Nursing note and vitals reviewed. Constitutional: She is oriented to person, place, and time. She appears well-developed and well-nourished. No distress.  HENT:  Head: Normocephalic and atraumatic.  Mouth/Throat: Oropharynx is clear and moist.  Cardiovascular: Normal rate, regular rhythm and normal heart sounds.  Exam reveals no gallop and no friction rub.   No murmur heard. Pulmonary/Chest: Effort normal and breath sounds normal. No respiratory distress.  Neurological: She is alert and oriented to person, place, and time.  Skin: Skin is warm and dry. No rash noted.    ED Course  Procedures (including critical care time) Labs Reviewed  URINALYSIS,  ROUTINE W REFLEX  MICROSCOPIC - Abnormal; Notable for the following:    Color, Urine RED (*)    APPearance TURBID (*)    Hgb urine dipstick LARGE (*)    Bilirubin Urine MODERATE (*)    Ketones, ur 15 (*)    Protein, ur >300 (*)    Nitrite POSITIVE (*)    Leukocytes, UA MODERATE (*)    All other components within normal limits  CBC WITH DIFFERENTIAL - Abnormal; Notable for the following:    WBC 12.5 (*)    Neutrophils Relative % 80 (*)    Neutro Abs 10.0 (*)    All other components within normal limits  COMPREHENSIVE METABOLIC PANEL - Abnormal; Notable for the following:    Glucose, Bld 123 (*)    GFR calc non Af Amer 70 (*)    GFR calc Af Amer 81 (*)    All other components within normal limits  URINE MICROSCOPIC-ADD ON - Abnormal; Notable for the following:    Squamous Epithelial / LPF FEW (*)    Bacteria, UA FEW (*)    All other components within normal limits  URINE CULTURE   Dg Abd 1 View  09/18/2012   *RADIOLOGY REPORT*  Clinical Data: Right flank pain.  Previous history of kidney stones.  ABDOMEN - 1 VIEW  Comparison: CT abdomen and pelvis from [facility]07/05/2012.  Findings: Intrarenal and right ureteral stones seen on previous CT are not demonstrated on plain radiograph.  Gas and stool throughout the colon.  No small or large bowel distension.  Surgical clips in the right upper quadrant.  Mild degenerative changes in the spine.  IMPRESSION: No radiopaque stones are definitely identified.   Original Report Authenticated By: Burman Nieves, M.D.   We attempted to control the patient's pain.  I called Dr. Vernie Ammons, who is advised, that he get her pain under control.  Have her call the office for him for a more close follow up appointment.  Patient's pain is not controlled I paged Dr.Wrenn who did the procedure and he was admitted to the hospital for further control of her pain  MDM    Carlyle Dolly, PA-C 09/18/12 1253

## 2012-09-19 LAB — URINE CULTURE
Colony Count: NO GROWTH
Culture: NO GROWTH

## 2012-09-19 MED ORDER — OXYCODONE-ACETAMINOPHEN 5-325 MG PO TABS: 1.0000 | ORAL_TABLET | ORAL | Status: DC | PRN

## 2012-09-19 NOTE — ED Provider Notes (Signed)
Medical screening examination/treatment/procedure(s) were performed by non-physician practitioner and as supervising physician I was immediately available for consultation/collaboration.  Ksean Vale, MD 09/19/12 0728 

## 2012-09-19 NOTE — Progress Notes (Signed)
Patient ID: [name], female   DOB: 16-Dec-1965, 47 y.o.   MRN: [id number]  Pt feeling much better. Still some intermittent right flank pain but mild - mod. Much less compared to when she had the stent in place (she pulled the stent out because it was hurting so much) and much less compared to yesterday morning after she pulled the stent.   No nausea or emesis. She is NPO p MN, but did well yesterday and "drank a lot" of fluids.  NAD No CVAT A&Ox3  Imp - S/p ureteroscopy with pain - improved. We discussed nature, R/B of stent vs. No stent (pain, infection, etc. ). She wants to go home. We discussed abx (t 99, white count) - she feels well and doesn't want abx due to h/o c diff. She has keflex at home if needed.   Plan - Home on tamsulosin, Levsin, pain meds  F/u Dr. Annabell Howells

## 2012-09-19 NOTE — Discharge Summary (Signed)
Physician Discharge Summary  Patient ID: [name] MRN: [id number] DOB/AGE: 46-03-1965 47 y.o.  Admit date: 09/18/2012 Discharge date: 09/19/2012  Admission Diagnoses: Flank pain  Discharge Diagnoses:  Flank pain  Discharged Condition: good  Hospital Course: Admitted for IVF and pain control after pulling her stent following ureteroscopy. Today she has some mild pain but is tolerating a regular diet and AFVSS. She elects to go home.   Consults: None  Significant Diagnostic Studies: none  Treatments: IVF, pain control  Discharge Exam: Blood pressure 122/74, pulse 76, temperature 99.1 F (37.3 C), temperature source Oral, resp. rate 16, height 5\' 5"  (1.651 m), weight 93.622 kg (206 lb 6.4 oz), SpO2 98.00%. NAD A&Ox3 Abd - soft No CVAT  Disposition: 01-Home or Self Care     Medication List         acetaminophen 650 MG CR tablet  Commonly known as:  TYLENOL  Take 1,300 mg by mouth daily as needed for pain.     albuterol 108 (90 BASE) MCG/ACT inhaler  Commonly known as:  PROVENTIL HFA;VENTOLIN HFA  Inhale 2 puffs into the lungs every 4 (four) hours as needed (for bronchitis).     amphetamine-dextroamphetamine 20 MG 24 hr capsule  Commonly known as:  ADDERALL XR  Take 20 mg by mouth daily.     dicyclomine 10 MG capsule  Commonly known as:  BENTYL  Take 10 mg by mouth 3 (three) times daily.     diphenhydrAMINE 25 mg capsule  Commonly known as:  BENADRYL  Take 25 mg by mouth every 6 (six) hours as needed for itching.     hydrocortisone valerate cream 0.2 %  Commonly known as:  WESTCORT  Apply 1 application topically 4 (four) times daily as needed (for eczema/psoriasis on face).     hyoscyamine 0.125 MG SL tablet  Commonly known as:  LEVSIN/SL  Place 1 tablet (0.125 mg total) under the tongue every 4 (four) hours as needed for cramping.     ibuprofen 800 MG tablet  Commonly known as:  ADVIL,MOTRIN  Take 800 mg by mouth every 8 (eight) hours as needed  for pain.     methocarbamol 500 MG tablet  Commonly known as:  ROBAXIN  Take 500 mg by mouth at bedtime.     metoprolol tartrate 25 MG tablet  Commonly known as:  LOPRESSOR  Take 25 mg by mouth 2 (two) times daily.     omeprazole 40 MG capsule  Commonly known as:  PRILOSEC  Take 40 mg by mouth 2 (two) times daily.     ondansetron 4 MG disintegrating tablet  Commonly known as:  ZOFRAN ODT  4mg  ODT q4 hours prn nausea/vomit     ondansetron 8 MG disintegrating tablet  Commonly known as:  ZOFRAN-ODT  Take 8 mg by mouth 2 (two) times daily as needed for nausea.     oxyCODONE-acetaminophen 5-325 MG per tablet  Commonly known as:  PERCOCET  Take 2 tablets by mouth every 4 (four) hours as needed for pain.     oxyCODONE-acetaminophen 5-325 MG per tablet  Commonly known as:  ROXICET  Take 1 tablet by mouth every 4 (four) hours as needed for pain.     phenazopyridine 200 MG tablet  Commonly known as:  PYRIDIUM  Take 1 tablet (200 mg total) by mouth 3 (three) times daily as needed for pain.     PROBIOTIC PO  Take 1 capsule by mouth daily. Digestive Advantage     promethazine  25 MG tablet  Commonly known as:  PHENERGAN  Take 25 mg by mouth daily as needed for nausea.     tamsulosin 0.4 MG Caps  Commonly known as:  FLOMAX  Take 1 capsule (0.4 mg total) by mouth daily.     traMADol 50 MG tablet  Commonly known as:  ULTRAM  Take 50-100 mg by mouth 3 (three) times daily as needed for pain.     Vitamin D (Ergocalciferol) 50000 UNITS Caps  Commonly known as:  DRISDOL  Take 50,000 Units by mouth every 7 (seven) days. On Saturdays     zolpidem 10 MG tablet  Commonly known as:  AMBIEN  Take 10 mg by mouth at bedtime as needed for sleep.           Follow-up Information   Follow up with Anner Crete, MD In 6 weeks.   Contact information:   [address] [place] [address] [phone]       Signed: Antony Haste 09/19/2012, 12:10 PM

## 2012-09-25 ENCOUNTER — Emergency Department (HOSPITAL_COMMUNITY)
Admission: EM | Admit: 2012-09-25 | Discharge: 2012-09-25 | Disposition: A | Discharge status: Home / Self Care | Payer: 59 | Attending: Emergency Medicine | Admitting: Emergency Medicine

## 2012-09-25 ENCOUNTER — Encounter (HOSPITAL_COMMUNITY): Payer: Self-pay | Admitting: Emergency Medicine

## 2012-09-25 ENCOUNTER — Emergency Department (HOSPITAL_COMMUNITY): Payer: 59

## 2012-09-25 DIAGNOSIS — Z8719 Personal history of other diseases of the digestive system: Secondary | ICD-10-CM | POA: Insufficient documentation

## 2012-09-25 DIAGNOSIS — R109 Unspecified abdominal pain: Secondary | ICD-10-CM | POA: Insufficient documentation

## 2012-09-25 DIAGNOSIS — R110 Nausea: Secondary | ICD-10-CM | POA: Insufficient documentation

## 2012-09-25 DIAGNOSIS — Z3202 Encounter for pregnancy test, result negative: Secondary | ICD-10-CM | POA: Insufficient documentation

## 2012-09-25 DIAGNOSIS — Z8679 Personal history of other diseases of the circulatory system: Secondary | ICD-10-CM | POA: Insufficient documentation

## 2012-09-25 DIAGNOSIS — Z79899 Other long term (current) drug therapy: Secondary | ICD-10-CM | POA: Insufficient documentation

## 2012-09-25 DIAGNOSIS — G8929 Other chronic pain: Secondary | ICD-10-CM | POA: Insufficient documentation

## 2012-09-25 DIAGNOSIS — M549 Dorsalgia, unspecified: Secondary | ICD-10-CM | POA: Insufficient documentation

## 2012-09-25 DIAGNOSIS — F909 Attention-deficit hyperactivity disorder, unspecified type: Secondary | ICD-10-CM | POA: Insufficient documentation

## 2012-09-25 LAB — URINALYSIS, ROUTINE W REFLEX MICROSCOPIC
Leukocytes, UA: NEGATIVE
Nitrite: NEGATIVE
Protein, ur: 30 mg/dL — AB
Specific Gravity, Urine: 1.03 (ref 1.005–1.030)
Urobilinogen, UA: 0.2 mg/dL (ref 0.0–1.0)

## 2012-09-25 LAB — COMPREHENSIVE METABOLIC PANEL
ALT: 26 U/L (ref 0–35)
AST: 15 U/L (ref 0–37)
Alkaline Phosphatase: 81 U/L (ref 39–117)
CO2: 21 mEq/L (ref 19–32)
Calcium: 10.1 mg/dL (ref 8.4–10.5)
GFR calc Af Amer: >90 mL/min (ref >90)
GFR calc non Af Amer: >90 mL/min (ref >90)
Glucose, Bld: 161 mg/dL — ABNORMAL HIGH (ref 70–99)
Potassium: 4 mEq/L (ref 3.5–5.1)
Sodium: 137 mEq/L (ref 135–145)
Total Protein: 7.6 g/dL (ref 6.0–8.3)

## 2012-09-25 LAB — CBC WITH DIFFERENTIAL/PLATELET
Basophils Absolute: 0 10*3/uL (ref 0.0–0.1)
Eosinophils Relative: 1 % (ref 0–5)
Lymphocytes Relative: 21 % (ref 12–46)
Lymphs Abs: 2.1 10*3/uL (ref 0.7–4.0)
Neutrophils Relative %: 73 % (ref 43–77)
Platelets: 271 10*3/uL (ref 150–400)
RBC: 4.41 MIL/uL (ref 3.87–5.11)
RDW: 12.4 % (ref 11.5–15.5)
WBC: 9.8 10*3/uL (ref 4.0–10.5)

## 2012-09-25 LAB — URINE MICROSCOPIC-ADD ON

## 2012-09-25 MED ORDER — HYDROMORPHONE HCL 2 MG PO TABS: 2.0000 mg | ORAL_TABLET | ORAL | Status: DC | PRN

## 2012-09-25 MED ORDER — HYDROMORPHONE HCL PF 1 MG/ML IJ SOLN
1.0000 mg | Freq: Once | INTRAMUSCULAR | Status: AC
  Administered 2012-09-25: 1 mg via INTRAVENOUS
  Filled 2012-09-25: qty 1

## 2012-09-25 MED ORDER — DIPHENHYDRAMINE HCL 50 MG/ML IJ SOLN
25.0000 mg | Freq: Once | INTRAMUSCULAR | Status: AC
  Administered 2012-09-25: 25 mg via INTRAVENOUS
  Filled 2012-09-25: qty 1

## 2012-09-25 MED ORDER — ONDANSETRON HCL 4 MG/2ML IJ SOLN
4.0000 mg | Freq: Once | INTRAMUSCULAR | Status: DC
  Filled 2012-09-25: qty 2

## 2012-09-25 NOTE — ED Notes (Signed)
Pt states she has had L flank pain since this am, some discomfort for past two.  PMH of kidney stone, had lithotripsy 10 days ago on L side. Took home percocet and 8mg  zofran an hour ago with no relief.

## 2012-09-25 NOTE — ED Provider Notes (Signed)
History    CSN: [id number] Arrival date & time 09/25/12  1701  First MD Initiated Contact with Patient 09/25/12 1710     Chief Complaint  Patient presents with  . Nephrolithiasis   (Consider location/radiation/quality/duration/timing/severity/associated sxs/prior Treatment) The history is provided by the patient.   patient has had recent treatment for right-sided ureteral stones. She required a cystoscopy with stone removal and stent placement. After removal of the stent she developed more bleeding and pain. This is all happened within the last month. She states today she developed some pain on the left side. It is in her left flank and posterior left lower abdomen. No blood in the urine. She's had nausea vomiting. No diarrhea or constipation. No fevers. Past Medical History  Diagnosis Date  . Diverticulosis   . Chronic back pain   . ADHD (attention deficit hyperactivity disorder)   . Palpitations   . Nephrolithiasis   . Right ureteral stone 09/14/2012  . PONV (postoperative nausea and vomiting)    Past Surgical History  Procedure Laterality Date  . Partial colectomy    . Cystoscopy with retrograde pyelogram, ureteroscopy and stent placement    . Cystoscopy with retrograde pyelogram, ureteroscopy and stent placement    . Cystoscopy with retrograde pyelogram, ureteroscopy and stent placement    . Cholecystectomy    . Anterior cruciate ligament repair    . Anterior cruciate ligament repair    . Wrist surgery    . Cystoscopy with ureteroscopy N/A 09/14/2012    Procedure: CYSTOSCOPY WITH URETEROSCOPY, with  stent placement;  Surgeon: Anner Crete, MD;  Location: [facility];  Service: Urology;  Laterality: N/A;   Family History  Problem Relation Age of Onset  . Osteoarthritis Mother   . Osteoarthritis Maternal Grandmother   . Osteoarthritis Maternal Grandfather    History  Substance Use Topics  . Smoking status: Never Smoker   . Smokeless tobacco: Never Used  . Alcohol Use: 0 - .5  oz/week    0-1 drink(s) per week   OB History   Grav Para Term Preterm Abortions TAB SAB Ect Mult Living   2 2 2             Review of Systems  Constitutional: Negative for activity change and appetite change.  HENT: Negative for neck stiffness.   Eyes: Negative for pain.  Respiratory: Negative for chest tightness and shortness of breath.   Cardiovascular: Negative for chest pain and leg swelling.  Gastrointestinal: Positive for nausea and abdominal pain. Negative for vomiting and diarrhea.  Genitourinary: Positive for flank pain.  Musculoskeletal: Negative for back pain.  Skin: Negative for rash.  Neurological: Negative for weakness, numbness and headaches.  Psychiatric/Behavioral: Negative for behavioral problems.    Allergies  Ciprofloxacin; Flagyl; Ivp dye; and Other  Home Medications   Current Outpatient Rx  Name  Route  Sig  Dispense  Refill  . albuterol (PROVENTIL HFA;VENTOLIN HFA) 108 (90 BASE) MCG/ACT inhaler   Inhalation   Inhale 2 puffs into the lungs every 4 (four) hours as needed (for bronchitis).         Marland Kitchen amphetamine-dextroamphetamine (ADDERALL XR) 20 MG 24 hr capsule   Oral   Take 20 mg by mouth every morning.          . dicyclomine (BENTYL) 10 MG capsule   Oral   Take 10 mg by mouth 3 (three) times daily.         . diphenhydrAMINE (BENADRYL) 25 mg capsule  Oral   Take 25 mg by mouth every 6 (six) hours as needed for itching.         . hydrocortisone valerate cream (WESTCORT) 0.2 %   Topical   Apply 1 application topically 4 (four) times daily as needed (for eczema/psoriasis on face).         . hyoscyamine (LEVSIN/SL) 0.125 MG SL tablet   Sublingual   Place 1 tablet (0.125 mg total) under the tongue every 4 (four) hours as needed for cramping.   30 tablet   0   . ibuprofen (ADVIL,MOTRIN) 800 MG tablet   Oral   Take 800 mg by mouth every 8 (eight) hours as needed for pain.         . methocarbamol (ROBAXIN) 500 MG tablet   Oral    Take 1,000-1,500 mg by mouth at bedtime.          . metoprolol tartrate (LOPRESSOR) 25 MG tablet   Oral   Take 25 mg by mouth 2 (two) times daily.         Marland Kitchen omeprazole (PRILOSEC) 40 MG capsule   Oral   Take 40 mg by mouth 2 (two) times daily.         . ondansetron (ZOFRAN ODT) 4 MG disintegrating tablet      4mg  ODT q4 hours prn nausea/vomit   10 tablet   0   . ondansetron (ZOFRAN-ODT) 8 MG disintegrating tablet   Oral   Take 8 mg by mouth 2 (two) times daily as needed for nausea.         Marland Kitchen oxyCODONE-acetaminophen (PERCOCET) 5-325 MG per tablet   Oral   Take 2 tablets by mouth every 4 (four) hours as needed for pain.   25 tablet   0   . phenazopyridine (PYRIDIUM) 200 MG tablet   Oral   Take 1 tablet (200 mg total) by mouth 3 (three) times daily as needed for pain.   30 tablet   0   . polyethylene glycol (MIRALAX / GLYCOLAX) packet   Oral   Take 17 g by mouth every morning.         . Probiotic Product (PROBIOTIC PO)   Oral   Take 1 capsule by mouth daily. Digestive Advantage         . promethazine (PHENERGAN) 25 MG tablet   Oral   Take 25 mg by mouth daily as needed for nausea.         . tamsulosin (FLOMAX) 0.4 MG CAPS   Oral   Take 1 capsule (0.4 mg total) by mouth daily.   30 capsule   0   . traMADol (ULTRAM) 50 MG tablet   Oral   Take 50-100 mg by mouth 3 (three) times daily as needed for pain.         . Vitamin D, Ergocalciferol, (DRISDOL) 50000 UNITS CAPS   Oral   Take 50,000 Units by mouth every 7 (seven) days. On Saturdays         . zolpidem (AMBIEN) 10 MG tablet   Oral   Take 10 mg by mouth at bedtime as needed for sleep.         Marland Kitchen HYDROmorphone (DILAUDID) 2 MG tablet   Oral   Take 1 tablet (2 mg total) by mouth every 4 (four) hours as needed for pain.   12 tablet   0    BP 157/108  Pulse 110  Temp(Src) 98.1 F (36.7 C) (Oral)  Resp  22  SpO2 99% Physical Exam  Nursing note and vitals reviewed. Constitutional: She  is oriented to person, place, and time. She appears well-developed and well-nourished.  HENT:  Head: Normocephalic and atraumatic.  Eyes: EOM are normal. Pupils are equal, round, and reactive to light.  Neck: Normal range of motion. Neck supple.  Cardiovascular: Normal rate, regular rhythm and normal heart sounds.   No murmur heard. Pulmonary/Chest: Effort normal and breath sounds normal. No respiratory distress. She has no wheezes. She has no rales.  Abdominal: Soft. Bowel sounds are normal. She exhibits no distension. There is tenderness. There is no rebound and no guarding.  Tender in left upper abdomen. No rebound or guarding. With pressure in the upper abdomen she states it hurts him in the lower abdomen. Mild right-sided abdominal tenderness also.  Genitourinary:  CVA tenderness on left  Musculoskeletal: Normal range of motion.  Neurological: She is alert and oriented to person, place, and time. No cranial nerve deficit.  Skin: Skin is warm and dry.  Psychiatric: She has a normal mood and affect. Her speech is normal.    ED Course  Procedures (including critical care time) Labs Reviewed  URINALYSIS, ROUTINE W REFLEX MICROSCOPIC - Abnormal; Notable for the following:    APPearance CLOUDY (*)    Hgb urine dipstick LARGE (*)    Protein, ur 30 (*)    All other components within normal limits  COMPREHENSIVE METABOLIC PANEL - Abnormal; Notable for the following:    Glucose, Bld 161 (*)    Total Bilirubin 0.2 (*)    All other components within normal limits  URINE MICROSCOPIC-ADD ON - Abnormal; Notable for the following:    Squamous Epithelial / LPF FEW (*)    All other components within normal limits  CBC WITH DIFFERENTIAL  LIPASE, BLOOD  POCT PREGNANCY, URINE   US Renal  09/25/2012   *RADIOLOGY REPORT*  Clinical Data:  Left flank pain.  RENAL/URINARY TRACT ULTRASOUND COMPLETE  Comparison:  CT of the abdomen pelvis from 09/11/2012, and MRI of the lumbar spine performed  04/22/2010  Findings:  Right Kidney:  The right kidney measures 12.2 cm in length.  The kidney demonstrates normal size, echogenicity and configuration. No significant cortical thinning is seen.  No hydronephrosis or calcification is identified.  No masses are seen.  Left Kidney:  The left kidney measures 13.6 cm in length.  The kidney demonstrates normal size, echogenicity and configuration. No significant cortical thinning is seen.  No hydronephrosis or calcification is identified.  No masses are seen.  Bladder:  The bladder is mildly distended and is unremarkable in appearance.  Incidental note is made of diffuse fatty infiltration within the liver.  IMPRESSION:  1.  Unremarkable renal ultrasound. 2.  Diffuse fatty infiltration within the liver.   Original Report Authenticated By: Tonia Ghent, M.D.   1. Abdominal pain     MDM  Patient with abdominal pain on the left side. Recent ureteral stones on the right side that required stent. Left-sided head renal stones. Ultrasound does not show hydronephrosis. She does have some hematuria. No sign of infection. She's been having normal bowel movements. She had a recent CT that only showed stones. Patient feels somewhat better after pain medicines. She'll be discharged home to followup as needed  Juliet Rude. Rubin Payor, MD 09/25/12 2237

## 2012-11-17 ENCOUNTER — Ambulatory Visit (INDEPENDENT_AMBULATORY_CARE_PROVIDER_SITE_OTHER): Payer: 59 | Admitting: Surgery

## 2012-11-17 ENCOUNTER — Encounter (INDEPENDENT_AMBULATORY_CARE_PROVIDER_SITE_OTHER): Payer: Self-pay | Admitting: Surgery

## 2012-11-17 VITALS — BP 144/96 | HR 100 | Temp 97.9°F | Resp 15 | Ht 65.0 in | Wt 198.2 lb

## 2012-11-17 DIAGNOSIS — R1032 Left lower quadrant pain: Secondary | ICD-10-CM

## 2012-11-17 NOTE — Progress Notes (Signed)
Patient ID: [name], female   DOB: 1965-07-08, 47 y.o.   MRN: [id number]  Chief Complaint  Patient presents with  . New Evaluation    eval diverticulitis    HPI [name] is a 47 y.o. female.  Patient sent request of Dr. Clovis Riley of [facility] for left lower quadrant abdominal pain. Patient has history sigmoid colectomy 2 years ago at [facility] for sigmoid diverticulitis. She states she had an intraoperative leak. No operative notes to see at this point. No colonoscopy since surgery or barium enema. She is a chronic left lower quadrant abdominal pain but has a history of kidney stones and is actually had cystoscopy in July of this year for that. Urology did not feel her left lower quadrant pain is related to her kidney stones. The patient has had constipation since surgery. The pain is dull in nature located left lower quadrant and constant. No nausea vomiting. Patient has had 10 pound weight loss. No blood in her stool. HPI  Past Medical History  Diagnosis Date  . Diverticulosis   . Chronic back pain   . ADHD (attention deficit hyperactivity disorder)   . Palpitations   . Nephrolithiasis   . Right ureteral stone 09/14/2012  . PONV (postoperative nausea and vomiting)   . GERD (gastroesophageal reflux disease)     Past Surgical History  Procedure Laterality Date  . Partial colectomy    . Cystoscopy with retrograde pyelogram, ureteroscopy and stent placement    . Cystoscopy with retrograde pyelogram, ureteroscopy and stent placement    . Cystoscopy with retrograde pyelogram, ureteroscopy and stent placement    . Cholecystectomy    . Anterior cruciate ligament repair    . Anterior cruciate ligament repair    . Wrist surgery    . Cystoscopy with ureteroscopy N/A 09/14/2012    Procedure: CYSTOSCOPY WITH URETEROSCOPY, with  stent placement;  Surgeon: Anner Crete, MD;  Location: [facility];  Service: Urology;  Laterality: N/A;    Family  History  Problem Relation Age of Onset  . Osteoarthritis Mother   . Arthritis Mother   . Asthma Mother   . Osteoarthritis Maternal Grandmother   . Osteoarthritis Maternal Grandfather     Social History History  Substance Use Topics  . Smoking status: Never Smoker   . Smokeless tobacco: Never Used  . Alcohol Use: 0 - .5 oz/week    0-1 drink(s) per week    Allergies  Allergen Reactions  . Ciprofloxacin Other (See Comments)    Joint inflammation  . Flagyl [Metronidazole] Other (See Comments)    Doesn't remember reaction but should not take  . Ivp Dye [Iodinated Diagnostic Agents] Hives  . Other Itching    Narcotic pain medicines cause itching but can be managed with benadryl    Current Outpatient Prescriptions  Medication Sig Dispense Refill  . amphetamine-dextroamphetamine (ADDERALL XR) 20 MG 24 hr capsule Take 20 mg by mouth every morning.       . dicyclomine (BENTYL) 10 MG capsule Take 10 mg by mouth 3 (three) times daily.      . diphenhydrAMINE (BENADRYL) 25 mg capsule Take 25 mg by mouth every 6 (six) hours as needed for itching.      . hydrocortisone valerate cream (WESTCORT) 0.2 % Apply 1 application topically 4 (four) times daily as needed (for eczema/psoriasis on face).      Marland Kitchen ibuprofen (ADVIL,MOTRIN) 800 MG tablet Take 800 mg by mouth every 8 (eight)  hours as needed for pain.      . methocarbamol (ROBAXIN) 500 MG tablet Take 1,000-1,500 mg by mouth at bedtime.       . metoprolol tartrate (LOPRESSOR) 25 MG tablet Take 25 mg by mouth 2 (two) times daily.      Marland Kitchen omeprazole (PRILOSEC) 40 MG capsule Take 40 mg by mouth 2 (two) times daily.      . ondansetron (ZOFRAN-ODT) 8 MG disintegrating tablet Take 8 mg by mouth 2 (two) times daily as needed for nausea.      Marland Kitchen oxyCODONE-acetaminophen (PERCOCET) 5-325 MG per tablet Take 2 tablets by mouth every 4 (four) hours as needed for pain.  25 tablet  0  . polyethylene glycol (MIRALAX / GLYCOLAX) packet Take 17 g by mouth every  morning.      . Probiotic Product (PROBIOTIC PO) Take 1 capsule by mouth daily. Digestive Advantage      . promethazine (PHENERGAN) 25 MG tablet Take 25 mg by mouth daily as needed for nausea.      . tamsulosin (FLOMAX) 0.4 MG CAPS Take 1 capsule (0.4 mg total) by mouth daily.  30 capsule  0  . traMADol (ULTRAM) 50 MG tablet Take 50-100 mg by mouth 3 (three) times daily as needed for pain.      . Vitamin D, Ergocalciferol, (DRISDOL) 50000 UNITS CAPS Take 50,000 Units by mouth every 7 (seven) days. On Saturdays      . zolpidem (AMBIEN) 10 MG tablet Take 10 mg by mouth at bedtime as needed for sleep.      Marland Kitchen albuterol (PROVENTIL HFA;VENTOLIN HFA) 108 (90 BASE) MCG/ACT inhaler Inhale 2 puffs into the lungs every 4 (four) hours as needed (for bronchitis).      . hyoscyamine (LEVSIN/SL) 0.125 MG SL tablet Place 1 tablet (0.125 mg total) under the tongue every 4 (four) hours as needed for cramping.  30 tablet  0  . phenazopyridine (PYRIDIUM) 200 MG tablet Take 1 tablet (200 mg total) by mouth 3 (three) times daily as needed for pain.  30 tablet  0   No current facility-administered medications for this visit.    Review of Systems Review of Systems  Constitutional: Negative.   Eyes: Negative.   Respiratory: Negative.   Cardiovascular: Negative.   Gastrointestinal: Positive for abdominal pain and constipation.  Endocrine: Negative.   Genitourinary: Positive for hematuria and flank pain.  Allergic/Immunologic: Negative.   Neurological: Negative.   Hematological: Negative.   Psychiatric/Behavioral: Negative.     Blood pressure 144/96, pulse 100, temperature 97.9 F (36.6 C), temperature source Temporal, resp. rate 15, height 5\' 5"  (1.651 m), weight 198 lb 3.2 oz (89.903 kg).  Physical Exam Physical Exam  Constitutional: She is oriented to person, place, and time. She appears well-developed and well-nourished.  HENT:  Head: Normocephalic and atraumatic.  Eyes: EOM are normal. Pupils are  equal, round, and reactive to light.  Neck: Normal range of motion. Neck supple.  Cardiovascular: Normal rate and regular rhythm.   Pulmonary/Chest: Effort normal and breath sounds normal.  Abdominal: Soft. Bowel sounds are normal. She exhibits no distension and no mass. There is tenderness. There is no rebound and no guarding.    Musculoskeletal: Normal range of motion.  Neurological: She is alert and oriented to person, place, and time.  Skin: Skin is warm and dry.  Psychiatric: She has a normal mood and affect. Her behavior is normal. Judgment and thought content normal.      Assessment  Left lower quadrant abdominal pain with history of renal calculi. Patient has history of sigmoid colectomy 2 years ago for diverticular disease at [facility]. She has had chronic left lower quadrant pain since. No recent colonoscopy or barium enema. Has had chronic constipation since surgery.    Plan    Patient needs further workup to include barium enema and possible colonoscopy. CT from July 2014 reviewed which shows minimal diverticular disease. Not sure pain is from diverticulitis. Patient could have anastomotic stricture or functional constipation causing this discomfort. Recommend doubling up on her MiraLAX for now and we'll see her back after barium enemas done.       [name] A. 11/17/2012, 3:57 PM

## 2012-11-17 NOTE — Patient Instructions (Signed)
Barium enema.  Return to clinic after.  Double up on miralax.  Return 3 weeks

## 2012-11-20 ENCOUNTER — Telehealth (INDEPENDENT_AMBULATORY_CARE_PROVIDER_SITE_OTHER): Payer: Self-pay | Admitting: General Surgery

## 2012-11-20 NOTE — Telephone Encounter (Signed)
Spoke with pt and explained that we have her scheduled for her DG colon with [facility] located at [address] on 11/27/12 with an arrival time of 9:30 am.  Explained to the patient that she will need to pick up her prep packet from our office at least one day before the exam.  Explained that I have this packet ready with her name on it at our front desk.

## 2012-11-25 ENCOUNTER — Telehealth (INDEPENDENT_AMBULATORY_CARE_PROVIDER_SITE_OTHER): Payer: Self-pay

## 2012-11-25 NOTE — Telephone Encounter (Signed)
Called Darel Hong back and verified i need CT and UA culture reports faxed to me.

## 2012-11-25 NOTE — Telephone Encounter (Signed)
Please contact Darel Hong at Dr. Belva Crome office about this patient.  She has a question about records request by Dr. Luisa Hart.  Extension [phone].

## 2012-11-27 ENCOUNTER — Ambulatory Visit
Admission: RE | Admit: 2012-11-27 | Discharge: 2012-11-27 | Disposition: A | Discharge status: Home / Self Care | Payer: 59 | Source: Ambulatory Visit | Attending: Surgery | Admitting: Surgery

## 2012-11-27 DIAGNOSIS — R1032 Left lower quadrant pain: Secondary | ICD-10-CM

## 2012-12-12 ENCOUNTER — Encounter (INDEPENDENT_AMBULATORY_CARE_PROVIDER_SITE_OTHER): Payer: 59 | Admitting: Surgery

## 2012-12-22 ENCOUNTER — Encounter (INDEPENDENT_AMBULATORY_CARE_PROVIDER_SITE_OTHER): Payer: Self-pay | Admitting: Surgery

## 2012-12-22 ENCOUNTER — Ambulatory Visit (INDEPENDENT_AMBULATORY_CARE_PROVIDER_SITE_OTHER): Payer: 59 | Admitting: Surgery

## 2012-12-22 VITALS — BP 140/80 | HR 78 | Resp 16 | Ht 65.0 in | Wt 199.6 lb

## 2012-12-22 DIAGNOSIS — Z8719 Personal history of other diseases of the digestive system: Secondary | ICD-10-CM

## 2012-12-22 NOTE — Patient Instructions (Signed)
Refer to DR Earnest Bailey colorectal surgeon for opinion. OK to use Miralax and mag citrate as needed.

## 2012-12-22 NOTE — Progress Notes (Signed)
Subjective:     Patient ID: [name], female   DOB: September 24, 1965, 47 y.o.   MRN: [id number]  HPI Patient returns for follow up of [name] left lower quadrant abdominal pain. Barium enema was done which shows descending colon sigmoid diverticuli. She had previous laparoscopic sigmoid colectomy at the beginning of 2013 and has continued left lower quadrant pain off and on. I have reviewed [name] operative notes from [place] and [name] barium enema. She feels better when she takes a laxative. She felt better after [name] barium enema. Pain which is dull in nature and waxes and wanes located in the left lower left upper quadrant return. She is able to work.  Review of Systems  Gastrointestinal: Positive for abdominal pain.       Objective:   Physical Exam  Constitutional: She appears well-developed and well-nourished.  Abdominal: Soft. She exhibits no distension. There is tenderness.  Skin: Skin is warm and dry.  Psychiatric: [name] behavior is normal. Judgment and thought content normal.       Assessment:     Left lower quadrant abdominal pain since laparoscopic sigmoid colectomy done at [facility] beginning of January 2013.    Plan:     The patient still has significant diverticuli on barium enema. The anastomosis appears to be at the rectosigmoid junction since there are no diverticuli distal. Not clear to me if [name] pain is from this or irritable bowel disease. Will refer to colorectal surgeon Dr. Maisie Fus for opinion to see if colonic resection of remaining diverticular disease of any benefit. Not sure this would make [name] pain any better after reviewing [name] barium enema, abdominal CT scan and operative notes from [name] first operation. She is working at this point in time and it's not causing a significant problem to [name]. She has no inclination for surgery at this point in time but possibly in 2015 if felt to be warranted. May need colonoscopy as well.

## 2013-01-07 ENCOUNTER — Encounter: Payer: Self-pay | Admitting: Internal Medicine

## 2013-01-07 ENCOUNTER — Ambulatory Visit (INDEPENDENT_AMBULATORY_CARE_PROVIDER_SITE_OTHER): Payer: 59 | Admitting: General Surgery

## 2013-01-07 ENCOUNTER — Encounter (INDEPENDENT_AMBULATORY_CARE_PROVIDER_SITE_OTHER): Payer: Self-pay | Admitting: General Surgery

## 2013-01-07 VITALS — BP 138/80 | HR 76 | Temp 97.4°F | Resp 16 | Ht 65.0 in | Wt 198.6 lb

## 2013-01-07 DIAGNOSIS — K5900 Constipation, unspecified: Secondary | ICD-10-CM

## 2013-01-07 NOTE — Progress Notes (Signed)
Chief Complaint  Patient presents with  . New Evaluation    EPNP/ hx of diverticulitis    HISTORY:  [name] is a 47 y.o. female who presents to clinic with chronic abd pain.  She is s/p sigmoid resection in 12/12 at [facility].  She did have a small anterior leak that was sutured intra-operatively.  She had a difficult post operative course but it was not an extended stay.  She had an episode of C diff post operatively as well.  She was hospitalized 3 months later for another episode inflammation in her descending colon.  Currently, she has consistent L sided pain and LLQ point tenderness.   She notes this is associated with constipation.  She reports regular BM's with taking Miralax 1-2 times a day.  She reports straining to have BM's every other week or so.  She does have kidney stones, but they have been evaluated by [facility] and her pain is not felt to be caused by these.     Past Medical History  Diagnosis Date  . Diverticulosis   . Chronic back pain   . ADHD (attention deficit hyperactivity disorder)   . Palpitations   . Nephrolithiasis   . Right ureteral stone 09/14/2012  . PONV (postoperative nausea and vomiting)   . GERD (gastroesophageal reflux disease)        Past Surgical History  Procedure Laterality Date  . Partial colectomy    . Cystoscopy with retrograde pyelogram, ureteroscopy and stent placement    . Cystoscopy with retrograde pyelogram, ureteroscopy and stent placement    . Cystoscopy with retrograde pyelogram, ureteroscopy and stent placement    . Cholecystectomy    . Anterior cruciate ligament repair    . Anterior cruciate ligament repair    . Wrist surgery    . Cystoscopy with ureteroscopy N/A 09/14/2012    Procedure: CYSTOSCOPY WITH URETEROSCOPY, with  stent placement;  Surgeon: Anner Crete, MD;  Location: [facility];  Service: Urology;  Laterality: N/A;      Current Outpatient Prescriptions  Medication Sig Dispense Refill  . albuterol (PROVENTIL  HFA;VENTOLIN HFA) 108 (90 BASE) MCG/ACT inhaler Inhale 2 puffs into the lungs every 4 (four) hours as needed (for bronchitis).      Marland Kitchen amphetamine-dextroamphetamine (ADDERALL XR) 20 MG 24 hr capsule Take 20 mg by mouth every morning.       . dicyclomine (BENTYL) 10 MG capsule Take 10 mg by mouth 3 (three) times daily.      . diphenhydrAMINE (BENADRYL) 25 mg capsule Take 25 mg by mouth every 6 (six) hours as needed for itching.      . hydrocortisone valerate cream (WESTCORT) 0.2 % Apply 1 application topically 4 (four) times daily as needed (for eczema/psoriasis on face).      . hyoscyamine (LEVSIN/SL) 0.125 MG SL tablet Place 1 tablet (0.125 mg total) under the tongue every 4 (four) hours as needed for cramping.  30 tablet  0  . ibuprofen (ADVIL,MOTRIN) 800 MG tablet Take 800 mg by mouth every 8 (eight) hours as needed for pain.      . methocarbamol (ROBAXIN) 500 MG tablet Take 1,000-1,500 mg by mouth at bedtime.       . metoprolol tartrate (LOPRESSOR) 25 MG tablet Take 25 mg by mouth 2 (two) times daily.      Marland Kitchen omeprazole (PRILOSEC) 40 MG capsule Take 40 mg by mouth 2 (two) times daily.      . ondansetron (ZOFRAN-ODT) 8 MG  disintegrating tablet Take 8 mg by mouth 2 (two) times daily as needed for nausea.      Marland Kitchen oxyCODONE-acetaminophen (PERCOCET) 5-325 MG per tablet Take 2 tablets by mouth every 4 (four) hours as needed for pain.  25 tablet  0  . phenazopyridine (PYRIDIUM) 200 MG tablet Take 1 tablet (200 mg total) by mouth 3 (three) times daily as needed for pain.  30 tablet  0  . polyethylene glycol (MIRALAX / GLYCOLAX) packet Take 17 g by mouth every morning.      . Probiotic Product (PROBIOTIC PO) Take 1 capsule by mouth daily. Digestive Advantage      . promethazine (PHENERGAN) 25 MG tablet Take 25 mg by mouth daily as needed for nausea.      . tamsulosin (FLOMAX) 0.4 MG CAPS Take 1 capsule (0.4 mg total) by mouth daily.  30 capsule  0  . traMADol (ULTRAM) 50 MG tablet Take 50-100 mg by mouth 3  (three) times daily as needed for pain.      . Vitamin D, Ergocalciferol, (DRISDOL) 50000 UNITS CAPS Take 50,000 Units by mouth every 7 (seven) days. On Saturdays      . zolpidem (AMBIEN) 10 MG tablet Take 10 mg by mouth at bedtime as needed for sleep.       No current facility-administered medications for this visit.     Allergies  Allergen Reactions  . Ciprofloxacin Other (See Comments)    Joint inflammation  . Flagyl [Metronidazole] Other (See Comments)    Doesn't remember reaction but should not take  . Ivp Dye [Iodinated Diagnostic Agents] Hives  . Other Itching    Narcotic pain medicines cause itching but can be managed with benadryl      Family History  Problem Relation Age of Onset  . Osteoarthritis Mother   . Arthritis Mother   . Asthma Mother   . Osteoarthritis Maternal Grandmother   . Osteoarthritis Maternal Grandfather       History   Social History  . Marital Status: Married    Spouse Name: N/A    Number of Children: N/A  . Years of Education: N/A   Social History Main Topics  . Smoking status: Never Smoker   . Smokeless tobacco: Never Used  . Alcohol Use: 0 - .5 oz/week    0-1 drink(s) per week  . Drug Use: No  . Sexual Activity: No   Other Topics Concern  . None   Social History Narrative  . None       REVIEW OF SYSTEMS - PERTINENT POSITIVES ONLY: Review of Systems - General ROS: negative for - chills, fever or positive for weight loss Respiratory ROS: no cough, shortness of breath, or wheezing Cardiovascular ROS: no chest pain or dyspnea on exertion Gastrointestinal ROS: positive for - abdominal pain and constipation negative for - blood in stools or change in bowel habits Genito-Urinary ROS: no dysuria, trouble voiding, or hematuria  EXAM: Filed Vitals:   01/07/13 0859  BP: 138/80  Pulse: 76  Temp: 97.4 F (36.3 C)  Resp: 16    General appearance: alert and cooperative Resp: clear to auscultation bilaterally Cardio: regular rate  and rhythm GI: tender in LLQ   LABORATORY RESULTS: Available labs are reviewed    RADIOLOGY RESULTS:   Images and reports are reviewed. Most recent CT shows no evidence of diverticulitis   ASSESSMENT AND PLAN: KENYOTTA DORFMAN is a 47 year old female who presents to my office with recurrent left lower quadrant pain.  This is similar to pain she experienced several years ago prior to her sigmoidectomy. She appears to have received appropriate surgical resection for her possible diverticulitis in December of 2012. The operative report states that a resection was made at the rectosigmoid junction and therefore the risk of recurrent diverticulitis is very low. She also has no evidence clinically by CT scan or white blood cell count these episodes of pain are due to diverticulitis. I suspect her pain is mostly due to constipation and colonic spasm. She has a CT scan and a barium enema that really did not show any signs of anastomotic stricture. He has no evidence of obstruction on her CT scan. I do not see any pathology in her that I feel like a surgery would help. I recommended that she continue to work with her gastroenterologist to find a combination of medication and bowel regimen that would help ease her symptoms. I recommended that she take MiraLax daily and titrate the dose to one soft bowel movement a day.  I recommended that she try several different fiber supplements, so that she may hopefully find one that works well for her and can help her constipation issues.  She has requested a referral to a gastroenterologist in [place] as she feels is too far to travel to Dr. Quita Skye office in [place].  I think it may be reasonable to perform a sigmoidoscopy at some point in the future to evaluate her anastomosis internally. I told her that I would be glad to this or she could have her gastroenterologist take a look as well.  She will followup with me as needed.    Vanita Panda,  MD Colon and Rectal Surgery / General Surgery [facility]      Visit Diagnoses: 1. Unspecified constipation     Primary Care Physician: Gabriel Cirri, DO

## 2013-01-07 NOTE — Patient Instructions (Signed)

## 2013-01-14 ENCOUNTER — Encounter (INDEPENDENT_AMBULATORY_CARE_PROVIDER_SITE_OTHER): Payer: Self-pay

## 2013-01-15 ENCOUNTER — Encounter (INDEPENDENT_AMBULATORY_CARE_PROVIDER_SITE_OTHER): Payer: Self-pay

## 2013-01-15 ENCOUNTER — Other Ambulatory Visit: Payer: Self-pay

## 2013-01-26 ENCOUNTER — Encounter (INDEPENDENT_AMBULATORY_CARE_PROVIDER_SITE_OTHER): Payer: Self-pay

## 2013-02-04 ENCOUNTER — Encounter: Payer: Self-pay | Admitting: Internal Medicine

## 2013-02-10 ENCOUNTER — Encounter: Payer: Self-pay | Admitting: Internal Medicine

## 2013-02-10 ENCOUNTER — Ambulatory Visit (INDEPENDENT_AMBULATORY_CARE_PROVIDER_SITE_OTHER): Payer: 59 | Admitting: Internal Medicine

## 2013-02-10 VITALS — BP 104/80 | HR 87 | Ht 65.0 in | Wt 201.0 lb

## 2013-02-10 DIAGNOSIS — Z9889 Other specified postprocedural states: Secondary | ICD-10-CM

## 2013-02-10 DIAGNOSIS — Z8719 Personal history of other diseases of the digestive system: Secondary | ICD-10-CM

## 2013-02-10 DIAGNOSIS — N200 Calculus of kidney: Secondary | ICD-10-CM | POA: Insufficient documentation

## 2013-02-10 DIAGNOSIS — R109 Unspecified abdominal pain: Secondary | ICD-10-CM

## 2013-02-10 DIAGNOSIS — K5900 Constipation, unspecified: Secondary | ICD-10-CM

## 2013-02-10 DIAGNOSIS — Z8619 Personal history of other infectious and parasitic diseases: Secondary | ICD-10-CM | POA: Insufficient documentation

## 2013-02-10 DIAGNOSIS — R110 Nausea: Secondary | ICD-10-CM

## 2013-02-10 DIAGNOSIS — R1013 Epigastric pain: Secondary | ICD-10-CM

## 2013-02-10 DIAGNOSIS — Z9049 Acquired absence of other specified parts of digestive tract: Secondary | ICD-10-CM | POA: Insufficient documentation

## 2013-02-10 MED ORDER — DICYCLOMINE HCL 10 MG PO CAPS: 20.0000 mg | ORAL_CAPSULE | Freq: Three times a day (TID) | ORAL | Status: DC

## 2013-02-10 MED ORDER — LINACLOTIDE 145 MCG PO CAPS: 145.0000 ug | ORAL_CAPSULE | Freq: Every day | ORAL | Status: DC

## 2013-02-10 MED ORDER — MOVIPREP 100 G PO SOLR: ORAL | Status: DC

## 2013-02-10 NOTE — Progress Notes (Signed)
Patient ID: [name], female   DOB: 1965-04-01, 47 y.o.   MRN: [id number] HPI: [name] is a 47 yo female with somewhat complicated GI/GU history (diverticulosis with diverticulitis, status post left hemicolectomy December 2012, recurrent diverticulitis after resection with hospitalization complicated by C. difficile, history of bilateral renal stones), chronic lower back pain, GERD, ADHD is seen in consultation at the request of Dr. Maisie Fus to evaluate ongoing left-sided abdominal pain and constipation. She is here alone today. She reports that she has a constant left mid radiating to the left lower quadrant abdominal aching pain. This is worse before bowel movements somewhat better after it. It is a lot worse with constipation. It even radiates into her left mid and lower back. She does have a history of kidney stones which she reports was a different type pain. She's also seeing Dr. Annabell Howells at [facility] who did not feel that this pain is related to an ongoing renal stone. She has been using MiraLax 17 g daily to every other day for several months. This has helped somewhat bowel movement though she feels her bowel movements aren't complete. She tried Colace which worsened her abdominal pain. She endorses nausea and uses Zofran which helped some. No vomiting. She is taking Bentyl 10 mg 3 times a day when she remembers. She also uses tramadol and ibuprofen which help with her abdominal pain. She endorses some intermittent heartburn which is usually well controlled with omeprazole 40 mg twice daily.  She's noted some epigastric abdominal pain. She has lost about 20 pounds, inadvertently. No dysphagia or odynophagia.  Of note she was treated with antibiotics about 3-4 months ago. This seemed to help some with her abdominal pain but it returned shortly thereafter. She was treated with Augmentin at that time. She is allergic to Cipro which causes joint pains and Flagyl which contributed to her  peripheral neuropathy.  She's had multiple abdominal CT scans, the last of which was at [facility] where she was seen for left-sided abdominal pain.  She is working as a Patent examiner  No fevers or chills recently.  She has only had C. difficile colitis one time in early 2013 which was in the setting of being hospitalized for diverticulitis. She recalls taking Dificid, but also recalls it was prolonged oral vancomycin eventually resolved the infection  Past Medical History  Diagnosis Date  . Diverticulosis   . Chronic back pain   . ADHD (attention deficit hyperactivity disorder)   . Palpitations   . Nephrolithiasis   . Right ureteral stone 09/14/2012  . PONV (postoperative nausea and vomiting)   . GERD (gastroesophageal reflux disease)     Past Surgical History  Procedure Laterality Date  . Partial colectomy    . Cystoscopy with retrograde pyelogram, ureteroscopy and stent placement    . Cystoscopy with retrograde pyelogram, ureteroscopy and stent placement    . Cystoscopy with retrograde pyelogram, ureteroscopy and stent placement    . Cholecystectomy    . Anterior cruciate ligament repair    . Anterior cruciate ligament repair    . Wrist surgery    . Cystoscopy with ureteroscopy N/A 09/14/2012    Procedure: CYSTOSCOPY WITH URETEROSCOPY, with  stent placement;  Surgeon: Anner Crete, MD;  Location: [facility];  Service: Urology;  Laterality: N/A;    Current Outpatient Prescriptions  Medication Sig Dispense Refill  . albuterol (PROVENTIL HFA;VENTOLIN HFA) 108 (90 BASE) MCG/ACT inhaler Inhale 2 puffs into the lungs every 4 (four) hours  as needed (for bronchitis).      Marland Kitchen amphetamine-dextroamphetamine (ADDERALL XR) 20 MG 24 hr capsule Take 20 mg by mouth every morning.       . dicyclomine (BENTYL) 10 MG capsule Take 2 capsules (20 mg total) by mouth 3 (three) times daily.  60 capsule  3  . diphenhydrAMINE (BENADRYL) 25 mg capsule Take 25 mg by mouth every 6 (six) hours as  needed for itching.      . hydrocortisone valerate cream (WESTCORT) 0.2 % Apply 1 application topically 4 (four) times daily as needed (for eczema/psoriasis on face).      . hyoscyamine (LEVSIN/SL) 0.125 MG SL tablet Place 1 tablet (0.125 mg total) under the tongue every 4 (four) hours as needed for cramping.  30 tablet  0  . ibuprofen (ADVIL,MOTRIN) 800 MG tablet Take 800 mg by mouth every 8 (eight) hours as needed for pain.      . methocarbamol (ROBAXIN) 500 MG tablet Take 1,000-1,500 mg by mouth at bedtime.       . metoprolol tartrate (LOPRESSOR) 25 MG tablet Take 25 mg by mouth 2 (two) times daily.      Marland Kitchen omeprazole (PRILOSEC) 40 MG capsule Take 40 mg by mouth 2 (two) times daily.      . ondansetron (ZOFRAN-ODT) 8 MG disintegrating tablet Take 8 mg by mouth 2 (two) times daily as needed for nausea.      . phenazopyridine (PYRIDIUM) 200 MG tablet Take 1 tablet (200 mg total) by mouth 3 (three) times daily as needed for pain.  30 tablet  0  . polyethylene glycol (MIRALAX / GLYCOLAX) packet Take 17 g by mouth every morning.      . Probiotic Product (PROBIOTIC PO) Take 1 capsule by mouth daily. Digestive Advantage      . traMADol (ULTRAM) 50 MG tablet Take 50-100 mg by mouth 3 (three) times daily as needed for pain.      . Vitamin D, Ergocalciferol, (DRISDOL) 50000 UNITS CAPS Take 50,000 Units by mouth every 7 (seven) days. On Saturdays      . zolpidem (AMBIEN) 10 MG tablet Take 10 mg by mouth at bedtime as needed for sleep.      . Linaclotide (LINZESS) 145 MCG CAPS capsule Take 1 capsule (145 mcg total) by mouth daily.  30 capsule  6  . MOVIPREP 100 G SOLR Use per prep instruction  1 kit  0   No current facility-administered medications for this visit.    Allergies  Allergen Reactions  . Ciprofloxacin Other (See Comments)    Joint inflammation  . Flagyl [Metronidazole] Other (See Comments)    Doesn't remember reaction but should not take  . Ivp Dye [Iodinated Diagnostic Agents] Hives  .  Other Itching    Narcotic pain medicines cause itching but can be managed with benadryl    Family History  Problem Relation Age of Onset  . Osteoarthritis Mother   . Arthritis Mother   . Asthma Mother   . Osteoarthritis Maternal Grandmother   . Osteoarthritis Maternal Grandfather     History  Substance Use Topics  . Smoking status: Never Smoker   . Smokeless tobacco: Never Used  . Alcohol Use: 0 - .5 oz/week    0-1 drink(s) per week     Comment: rarely    ROS: As per history of present illness, otherwise negative  BP 104/80  Pulse 87  Ht 5\' 5"  (1.651 m)  Wt 201 lb (91.173 kg)  BMI 33.45  kg/m2  LMP 02/08/2013 Constitutional: Well-developed and well-nourished. No distress. HEENT: Normocephalic and atraumatic. Oropharynx is clear and moist. No oropharyngeal exudate. Conjunctivae are normal.  No scleral icterus. Neck: Neck supple. Trachea midline. Cardiovascular: Normal rate, regular rhythm and intact distal pulses. No M/R/G Pulmonary/chest: Effort normal and breath sounds normal. No wheezing, rales or rhonchi. Abdominal: Well-healed lower abdominal scar. Soft, mild to moderate left-sided tenderness with deep palpation, no rebound or guarding, nondistended. Bowel sounds active throughout.  Extremities: no clubbing, cyanosis, or edema Lymphadenopathy: No cervical adenopathy noted. Neurological: Alert and oriented to person place and time. Skin: Skin is warm and dry. No rashes noted. Psychiatric: Normal mood and affect. Behavior is normal.  RELEVANT LABS AND IMAGING: CBC    Component Value Date/Time   WBC 9.8 09/25/2012 1726   RBC 4.41 09/25/2012 1726   HGB 13.5 09/25/2012 1726   HCT 38.4 09/25/2012 1726   PLT 271 09/25/2012 1726   MCV 87.1 09/25/2012 1726   MCH 30.6 09/25/2012 1726   MCHC 35.2 09/25/2012 1726   RDW 12.4 09/25/2012 1726   LYMPHSABS 2.1 09/25/2012 1726   MONOABS 0.5 09/25/2012 1726   EOSABS 0.1 09/25/2012 1726   BASOSABS 0.0 09/25/2012 1726    CMP      Component Value Date/Time   NA 137 09/25/2012 1726   K 4.0 09/25/2012 1726   CL 104 09/25/2012 1726   CO2 21 09/25/2012 1726   GLUCOSE 161* 09/25/2012 1726   BUN 19 09/25/2012 1726   CREATININE 0.63 09/25/2012 1726   CALCIUM 10.1 09/25/2012 1726   PROT 7.6 09/25/2012 1726   ALBUMIN 3.9 09/25/2012 1726   AST 15 09/25/2012 1726   ALT 26 09/25/2012 1726   ALKPHOS 81 09/25/2012 1726   BILITOT 0.2* 09/25/2012 1726   GFRNONAA >90 09/25/2012 1726   GFRAA >90 09/25/2012 1726   BE WITH CONTRAST - WITHOUT AND WITH KUB -- 11/27/2012   TECHNIQUE: Full column barium enema   COMPARISON:  CT abdomen pelvis of 10/27/2012   FLUOROSCOPY TIME:  2 min 0 seconds   FINDINGS: A preliminary film of the abdomen shows a nonspecific bowel gas pattern.   Barium entered the colon without evidence of obstruction or delay. There are multiple rectosigmoid and descending colon diverticula with diffuse diverticula within the splenic flexure of colon. No diverticular are seen within the transverse colon or right colon. The cecum is well visualized with no abnormality noted. Post evacuation film again shows diverticula in the descending and rectosigmoid colon.   IMPRESSION: Diverticular in the rectosigmoid and descending colon. No persistent polypoid lesion or constricting lesion is seen.  CT below - performed at [facility]-- CT OF THE ABDOMEN AND PELVIS WITH INTRAVENOUS CONTRAST, Nov 10, 2012 05:21:59 PM . INDICATION: r/o diverticulitis789.04 LLQ abdominal pain  . COMPARISON: None . TECHNIQUE: Axial images of the abdomen and pelvis were obtained with intravenous contrast. Supplemental 2D reformatted images were generated and reviewed. . LOWER CHEST: . Mediastinum: Within normal limits. . Heart/vessels: Within normal limits. . Pleura: Within normal limits. . Lungs: Within normal limits. ABDOMEN: . Liver: Within normal limits. . Gallbladder/Biliary: Cholecystectomy. Marland Kitchen Spleen: Within normal limits. . Pancreas:  Within normal limits. . Adrenals: Within normal limits. . Kidneys: Tiny nonobstructing bilateral renal calculi. Marland Kitchen Peritoneum/mesenteries: Within normal limits. . Extraperitoneum: Within normal limits. . Gastrointestinal tract: Changes of partial colectomy with anastomosis in the sigmoid colon. No obstruction. Multiple diverticula. Normal appendix. No evidence of diverticulitis. PELVIS: . Ureters: Within normal limits. . Bladder: Within normal  limits. . Reproductive System: Within normal limits.  VASCULAR: Within normal limits. MSK: Scattered mild degenerative changes in the spine. No acute findings.  Comprehensive Metabolic Panel9/03/2012  [facility]  Component Name Value Range  SODIUM 138 135-146 MMOL/L   POTASSIUM 4.3 Comment: NO VISIBLE HEMOLYSIS 3.5-5.3 MMOL/L   CHLORIDE 103 98-110 MMOL/L   CO2 27 21-33 MMOL/L   BUN 13 8.0-24.0 MG/DL   GLUCOSE 94 96-04 MG/DL   CREATININE 5.40 9.8-1.1 MG/DL   CALCIUM 91.4 7.8-29.5 MG/DL   PROTEIN  7.4 6.2-1.3 G/DL   ALBUMIN 4.8 0.8-6.5 G/DL   BILIRUBIN TOTAL 0.5 7.8-4.6 MG/DL   ALKALINE PHOSPHATASE 72 25-125 U/L  AST 17 5-40 U/L  ALT 33 5-50 U/L  ANION GAP 8 4-14   EST. GFR NON-BLACK >60 Comment: GFR is calculated by IDMS traceable MDRD method and the result is about 5% lower than previously reported results (prior to Feb 20, 2010)   EST. GFR BLACK >60 Comment: GFR is calculated by IDMS traceable MDRD method and the result is about 5% lower than previously reported results (prior to Feb 20, 2010)     11/10/2012 10/29/2011 01/20/2011    7.4 7.1 8.2  4.81 4.34 4.22  14.4 13.0 13.1  43.6 39.7 39.3  90.5 91.4 93.2  29.9 29.9 31  33.0 32.7 (L) 33.3  12.9 13.3 13.5  267 249 228    ASSESSMENT/PLAN: 47 yo female with somewhat complicated GI/GU history (diverticulosis with diverticulitis, status post left hemicolectomy December 2012, recurrent diverticulitis after resection with hospitalization complicated by C.  difficile, history of bilateral renal stones), chronic lower back pain, GERD, ADHD is seen in consultation at the request of Dr. Maisie Fus to evaluate ongoing left-sided abdominal pain and constipation.  1. Left-sided abd pain/hx of left hemi-colectomy/hx of diverticulosis -- she has had recent imaging including CT scan of the abdomen and pelvis with contrast and a barium enema which did not show overt narrowing or problems with her left sided colonic anastomosis. There was also no evidence for recurrent diverticulitis. Given her history of C. difficile, I am hesitant to empirically treat with antibiotics at this time. It certainly seems that she improves when she is using laxatives. I would like to try her on more potent laxative in an attempt to obtain more complete bowel movement. I also will increase her anti-spasmodic which seems to help her pain. I will begin Linzess 145 mcg daily. She will stop MiraLax. I will also increase her Bentyl to 20 mg 3 times daily.  She can continue to use tramadol as needed and as directed for her pain.  I would like for her to avoid NSAIDs if possible. I agree with Dr. Maisie Fus the examination of the anastomosis is important and thus we will schedule her for colonoscopy. If there is no response to improved constipation, we can consider empiric antibiotics with twice daily Florastor given her history of C. difficile colitis.  2.  Epigastric pain -- she is taking twice daily PPI and despite this still having epigastric pain. This could be secondary to NSAID associated gastritis or even ulcer disease. We will perform upper endoscopy at the same time as her colonoscopy as discussed above. We discussed both tests including the risks and benefits and she is agreeable to proceed.  3.  Renal stones -- History of renal stones and renal colic, though on recent imaging there is no evidence of obstructing renal pathology. I agree that her current symptoms are more likely bowel  related than  urologic.

## 2013-02-10 NOTE — Patient Instructions (Signed)
You have been scheduled for a colonoscopy/Endoscopy with propofol. Please follow written instructions given to you at your visit today.  Please pick up your prep kit at the pharmacy within the next 1-3 days. If you use inhalers (even only as needed), please bring them with you on the day of your procedure. Your physician has requested that you go to www.startemmi.com and enter the access code given to you at your visit today. This web site gives a general overview about your procedure. However, you should still follow specific instructions given to you by our office regarding your preparation for the procedure.  We have sent the following medications to your pharmacy for you to pick up at your convenience: Linzess, increase your bentyl to 20 mg three times a day. Use tramadol as needed and limit the use of ibuprofin.  Moviprep; you were given instructions today at your office visit.                                                We are excited to introduce MyChart, a new best-in-class service that provides you online access to important information in your electronic medical record. We want to make it easier for you to view your health information - all in one secure location - when and where you need it. We expect MyChart will enhance the quality of care and service we provide.  When you register for MyChart, you can:    View your test results.    Request appointments and receive appointment reminders via email.    Request medication renewals.    View your medical history, allergies, medications and immunizations.    Communicate with your physician's office through a password-protected site.    Conveniently print information such as your medication lists.  To find out if MyChart is right for you, please talk to a member of our clinical staff today. We will gladly answer your questions about this free health and wellness tool.  If you are age 18 or older and want a member of your family  to have access to your record, you must provide written consent by completing a proxy form available at our office. Please speak to our clinical staff about guidelines regarding accounts for patients younger than age 57.  As you activate your MyChart account and need any technical assistance, please call the MyChart technical support line at [phone]) or email your question to [email or web address]. If you email your question(s), please include your name, a return phone number and the best time to reach you.  If you have non-urgent health-related questions, you can send a message to our office through MyChart at [email or web address]. If you have a medical emergency, call 911.  Thank you for using MyChart as your new health and wellness resource!   MyChart licensed from [employer],  4782-9562. Patents Pending.

## 2013-02-12 ENCOUNTER — Telehealth: Payer: Self-pay | Admitting: Internal Medicine

## 2013-02-12 NOTE — Telephone Encounter (Signed)
The cramping may improve now she has been able to have a bowel movement. If she is willing, let's move the Linzess 145 mcg daily to the time of her 1st meal rather than before it.  She should continue the Bentyl 20 mg 3 times daily. Also please ensure that she is staying hydrated and drinking plenty of fluids If cramping continues after several additional doses, we can try a different medication. If she is unhappy with this plan, please let me know

## 2013-02-12 NOTE — Telephone Encounter (Signed)
Pt seen 02/10/13 and was given Linzess for daily use. She reports 1st dose was yesterday with plenty of cramping and no BM. Today, she had worse cramping, but did have a BM that was dry and hard. Reviewed dosing instructions and she is taking it 1 hour prior to breakfast. Please advise. Thanks.

## 2013-02-12 NOTE — Telephone Encounter (Signed)
Informed pt of  DR Pyrtle's suggestion to take Linzess with a meal and to use Bentyl for cramping. 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 Informed her she may use Linzess at night when when will bw home and she will let us know of problems.

## 2013-03-12 HISTORY — PX: COLONOSCOPY: SHX174

## 2013-03-18 ENCOUNTER — Ambulatory Visit (AMBULATORY_SURGERY_CENTER): Payer: 59 | Admitting: Internal Medicine

## 2013-03-18 ENCOUNTER — Encounter: Payer: Self-pay | Admitting: Internal Medicine

## 2013-03-18 VITALS — BP 126/62 | HR 68 | Temp 98.1°F | Resp 17 | Ht 65.0 in | Wt 201.0 lb

## 2013-03-18 DIAGNOSIS — R109 Unspecified abdominal pain: Secondary | ICD-10-CM

## 2013-03-18 DIAGNOSIS — R1013 Epigastric pain: Secondary | ICD-10-CM

## 2013-03-18 DIAGNOSIS — Z9049 Acquired absence of other specified parts of digestive tract: Secondary | ICD-10-CM

## 2013-03-18 DIAGNOSIS — D131 Benign neoplasm of stomach: Secondary | ICD-10-CM

## 2013-03-18 DIAGNOSIS — K2990 Gastroduodenitis, unspecified, without bleeding: Secondary | ICD-10-CM

## 2013-03-18 DIAGNOSIS — Z9889 Other specified postprocedural states: Secondary | ICD-10-CM

## 2013-03-18 DIAGNOSIS — K2970 Gastritis, unspecified, without bleeding: Secondary | ICD-10-CM

## 2013-03-18 DIAGNOSIS — D126 Benign neoplasm of colon, unspecified: Secondary | ICD-10-CM

## 2013-03-18 DIAGNOSIS — Z8719 Personal history of other diseases of the digestive system: Secondary | ICD-10-CM

## 2013-03-18 MED ORDER — SODIUM CHLORIDE 0.9 % IV SOLN: 500.0000 mL | INTRAVENOUS | Status: DC

## 2013-03-18 NOTE — Op Note (Addendum)
[facility]  [address] [place], [id number]   COLONOSCOPY PROCEDURE REPORT  PATIENT: [name], [name]  MR#: [id number] BIRTHDATE: November 11, 1965 , 17  yrs. old GENDER: Female ENDOSCOPIST: Jerene Bears, MD PROCEDURE DATE:  03/18/2013 PROCEDURE:   Colonoscopy with snare polypectomy First Screening Colonoscopy - Avg.  risk and is 50 yrs.  old or older - No.  Prior Negative Screening - Now for repeat screening. N/A  History of Adenoma - Now for follow-up colonoscopy & has been > or = to 3 yrs.  N/A  Polyps Removed Today? Yes. ASA CLASS:   Class III INDICATIONS:abdominal pain in the lower left quadrant and history of sigmoidectomy. MEDICATIONS: MAC sedation, administered by CRNA and propofol (Diprivan) 350mg  IV  DESCRIPTION OF PROCEDURE:   After the risks benefits and alternatives of the procedure were thoroughly explained, informed consent was obtained.  A digital rectal exam revealed no rectal mass.   The LB [id number]  endoscope was introduced through the anus and advanced to the cecum, which was identified by both the appendix and ileocecal valve. No adverse events experienced. The quality of the prep was good, using MoviPrep  The instrument was then slowly withdrawn as the colon was fully examined.   COLON FINDINGS: Two sessile polyps measuring 5 mm in size were found in the descending colon.  Polypectomy was performed using cold snare.  All resections were complete and all polyp tissue was completely retrieved.   Mild diverticulosis was noted in the descending colon.   There was evidence of a normal appearing prior surgical anastomosis in the sigmoid colon.   The colon mucosa was otherwise normal.  Retroflexed views revealed no abnormalities. The time to cecum=1 minutes 15 seconds.  Withdrawal time=11 minutes 00 seconds.  The scope was withdrawn and the procedure completed. COMPLICATIONS: There were no complications.  ENDOSCOPIC IMPRESSION: 1.    Two sessile polyps measuring 5 mm in size were found in the descending colon; Polypectomy was performed using cold snare 2.   Mild diverticulosis was noted in the descending colon 3.   There was evidence of prior surgical anastomosis in the sigmoid colon; healthy appearing and widely patent 4.   The colon mucosa was otherwise normal RECOMMENDATIONS: 1.  Await pathology results 2.  Continue MiraLax as needed for constipation 3.  Follow-up with Dr.  Marcello Moores 4.  If the polyps removed today are proven to be adenomatous (pre-cancerous) polyps, you will need a repeat colonoscopy in 5 years.  Otherwise you should continue to follow colorectal cancer screening guidelines for "routine risk" patients with colonoscopy in 10 years.  You will receive a letter within 1-2 weeks with the results of your biopsy as well as final recommendations.  Please call my office if you have not received a letter after 3 weeks.  eSigned:  Jerene Bears, MD 03/18/2013 3:09 PM Revised: 03/18/2013 3:09 PM  cc: The Patient and Karma Greaser MD; Leighton Ruff, MD   PATIENT NAME:  Mercadies, Co MR#: [id number]

## 2013-03-18 NOTE — Progress Notes (Signed)
Lidocaine-40mg IV prior to Propofol InductionPropofol given over incremental dosages 

## 2013-03-18 NOTE — Op Note (Signed)
[facility]  [address] [place], [id number]   ENDOSCOPY PROCEDURE REPORT  PATIENT: Teresa, [name]  MR#: [id number] BIRTHDATE: 01-14-1966 , 33  yrs. old GENDER: Female ENDOSCOPIST: Jerene Bears, MD PROCEDURE DATE:  03/18/2013 PROCEDURE:  EGD w/ biopsy ASA CLASS:     Class III INDICATIONS:  Epigastric pain.   Left-sided abdominal pain. MEDICATIONS: MAC sedation, administered by CRNA and propofol (Diprivan) 400mg  IV TOPICAL ANESTHETIC: Cetacaine Spray  DESCRIPTION OF PROCEDURE: After the risks benefits and alternatives of the procedure were thoroughly explained, informed consent was obtained.  The LB [id number] endoscope was introduced through the mouth and advanced to the second portion of the duodenum. Without limitations.  The instrument was slowly withdrawn as the mucosa was fully examined.     ESOPHAGUS: A variable Z-line was observed 38 cm from the incisors. A biopsy was performed using cold forceps.  Sample obtained to rule out Barrett's esophagus.   The esophagus was otherwise normal.  STOMACH: A 2 cm hiatal hernia was noted.   Mild gastropathy was found in the gastric antrum.  Multiple biopsies were performed using cold forceps.   Multiple sessile polyps ranging between 3-52mm in size were found in the cardia, gastric fundus, and gastric body (likely fundic gland).  Multiple biopsies was performed using cold forceps.  DUODENUM: The duodenal mucosa showed no abnormalities in the bulb and second portion of the duodenum.  Retroflexed views revealed a hiatal hernia.     The scope was then withdrawn from the patient and the procedure completed.  COMPLICATIONS: There were no complications.  ENDOSCOPIC IMPRESSION: 1.   Variable Z-line was observed 38 cm from the incisors; biopsy to exclude Barrett's 2.   The esophagus was otherwise normal. 3.   2 cm hiatal hernia 4.   Gastropathy was found in the gastric antrum; multiple biopsies 5.    Multiple sessile polyps ranging between 3-35mm in size were found in the cardia, gastric fundus, and gastric body; multiple biopsies 6.   The duodenal mucosa showed no abnormalities in the bulb and second portion of the duodenum    RECOMMENDATIONS: 1.  Await biopsy results 2.  Follow-up of helicobacter pylori status, treat if indicated 3.  Colonoscopy today  eSigned:  Jerene Bears, MD 03/18/2013 2:59 PM   CC:The Patient and Karma Greaser MD, Leighton Ruff, MD  PATIENT NAME:  Teresa, [name] MR#: [id number]

## 2013-03-18 NOTE — Progress Notes (Signed)
Called to room to assist during endoscopic procedure.  Patient ID and intended procedure confirmed with present staff. Received instructions for my participation in the procedure from the performing physician. ewm 

## 2013-03-18 NOTE — Patient Instructions (Signed)
Impressions/recommendations:  Endoscopy  Hiatal hernia (handouts given) Multiple other biopsies obtained, see procedure report  Colonoscopy  Polyps (handout given) Diverticulosis (handout given) High Fiber diet (handout given)  YOU HAD AN ENDOSCOPIC PROCEDURE TODAY AT [facility]: Refer to the procedure report that was given to you for any specific questions about what was found during the examination.  If the procedure report does not answer your questions, please call your gastroenterologist to clarify.  If you requested that your care partner not be given the details of your procedure findings, then the procedure report has been included in a sealed envelope for you to review at your convenience later.  YOU SHOULD EXPECT: Some feelings of bloating in the abdomen. Passage of more gas than usual.  Walking can help get rid of the air that was put into your GI tract during the procedure and reduce the bloating. If you had a lower endoscopy (such as a colonoscopy or flexible sigmoidoscopy) you may notice spotting of blood in your stool or on the toilet paper. If you underwent a bowel prep for your procedure, then you may not have a normal bowel movement for a few days.  DIET: Your first meal following the procedure should be a light meal and then it is ok to progress to your normal diet.  A half-sandwich or bowl of soup is an example of a good first meal.  Heavy or fried foods are harder to digest and may make you feel nauseous or bloated.  Likewise meals heavy in dairy and vegetables can cause extra gas to form and this can also increase the bloating.  Drink plenty of fluids but you should avoid alcoholic beverages for 24 hours.  ACTIVITY: Your care partner should take you home directly after the procedure.  You should plan to take it easy, moving slowly for the rest of the day.  You can resume normal activity the day after the procedure however you should NOT DRIVE or use heavy  machinery for 24 hours (because of the sedation medicines used during the test).    SYMPTOMS TO REPORT IMMEDIATELY: A gastroenterologist can be reached at any hour.  During normal business hours, 8:30 AM to 5:00 PM Monday through Friday, call [phone].  After hours and on weekends, please call the GI answering service at [phone] who will take a message and have the physician on call contact you.   Following lower endoscopy (colonoscopy or flexible sigmoidoscopy):  Excessive amounts of blood in the stool  Significant tenderness or worsening of abdominal pains  Swelling of the abdomen that is new, acute  Fever of 100F or higher  Following upper endoscopy (EGD)  Vomiting of blood or coffee ground material  New chest pain or pain under the shoulder blades  Painful or persistently difficult swallowing  New shortness of breath  Fever of 100F or higher  Black, tarry-looking stools  FOLLOW UP: If any biopsies were taken you will be contacted by phone or by letter within the next 1-3 weeks.  Call your gastroenterologist if you have not heard about the biopsies in 3 weeks.  Our staff will call the home number listed on your records the next business day following your procedure to check on you and address any questions or concerns that you may have at that time regarding the information given to you following your procedure. This is a courtesy call and so if there is no answer at the home number and we have  not heard from you through the emergency physician on call, we will assume that you have returned to your regular daily activities without incident.  SIGNATURES/CONFIDENTIALITY: You and/or your care partner have signed paperwork which will be entered into your electronic medical record.  These signatures attest to the fact that that the information above on your After Visit Summary has been reviewed and is understood.  Full responsibility of the confidentiality of this discharge  information lies with you and/or your care-partner.

## 2013-03-19 ENCOUNTER — Telehealth: Payer: Self-pay | Admitting: *Deleted

## 2013-03-19 NOTE — Telephone Encounter (Signed)
Pt. Contacted states she if feeling better.  Advised her that Dr. Hilarie Fredrickson agrees with her taking meds earlier.  She was advised to call Dr. Vena Rua office early afternoon if She does not feel better for scheduling of x-ray.  She verbalized understanding.

## 2013-03-19 NOTE — Telephone Encounter (Addendum)
  Follow up Call-  Call back number 03/18/2013  Post procedure Call Back phone  # [phone]  Permission to leave phone message Yes     Patient questions:  Do you have a fever, pain , or abdominal swelling? yes Pain Score  8 *  Have you tolerated food without any problems? yes  Have you been able to return to your normal activities? no  Do you have any questions about your discharge instructions: Diet   no Medications  no Follow up visit  no  Do you have questions or concerns about your Care? yes  Actions: * If pain score is 4 or above: Physician/ provider Notified : Zenovia Jarred, MD  Pt. States that she has had cramping"in waves with pain level of 8". States pain was initially every couple of minutes. She took hycosamine and Zofran (nausea as well) about 0730 with some relief of cramping and nausea.  Denies abdominal distension or fever.  She ate yesterday After returning home.  Has not had any food this AM.  She does not feel that her cramping is gas pain.  Please advise..   Addendum: Agree with Levsin and zofran as needed.  Levsin can be used every 4 hours today as needed If no better by early afternoon, would check 2 view abd xray

## 2013-03-19 NOTE — Telephone Encounter (Signed)
Pt. Advised that Dr. Hilarie Fredrickson agreed that taking the medications this am was the right thing to do.  Advised pt. To call office early this  Afternoon if she feels worse and an x-ray can be ordered- per Dr. Hilarie Fredrickson.. States that she is feeling better at this point.

## 2013-03-20 ENCOUNTER — Telehealth: Payer: Self-pay | Admitting: Internal Medicine

## 2013-03-20 NOTE — Telephone Encounter (Signed)
Actions:  * If pain score is 4 or above:  Physician/ provider Notified : Zenovia Jarred, MD Pt. States that she has had cramping"in waves with pain level of 8". States pain was initially every couple of minutes.  She took hycosamine and Zofran (nausea as well) about 0730 with some relief of cramping and nausea. Denies abdominal distension or fever. She ate yesterday  After returning home. Has not had any food this AM. She does not feel that her cramping is gas pain. Please advise..  Addendum: Agree with Levsin and zofran as needed. Levsin can be used every 4 hours today as needed  If no better by early afternoon, would check 2 view abd xray  Pt reported the problems listed about the day after her procedure on 03/18/13. Today, she  reorts the same problems. She has pain across her upper abdomen below her ribs that comes in waves. The pain clears on it's own and she never knows when the next one will occur. She is passing gas on both ends and she is having BMs, so she does not think it's gas. States the Bentyl and Levsin are helping. Offered her the 2 View XRAY, but she really doesn't want to do that. If Dr Hilarie Fredrickson does not answer her today, she doesn't want the DOC/DAY to answer, she will wait til Monday.

## 2013-03-23 NOTE — Telephone Encounter (Signed)
If her pain is worse now than before colonoscopy, then we would likely repeat the CT scan However, if her current pain, is the same as before the procedures (meaning the pain she was seen in consultation to evaluate), then I can see her back in clinic. She did not like Linzess and I hear that the anti-spasmotics (bentyl and levsin) are not helping. I do want to ensure she is having complete BMs and MiraLax can be titrated to achieve this result and avoid constipation

## 2013-03-23 NOTE — Telephone Encounter (Signed)
lmom for pt to call back

## 2013-03-24 NOTE — Telephone Encounter (Signed)
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 She was just arriving at a pt's house and will call be back. She does report she has the same pain as on OV. But, she has developed a new one that goes across her abdomen. She will call me back.

## 2013-03-24 NOTE — Telephone Encounter (Signed)
Pt states she's feeling better today. The pain is different from 1st visit, but not as bad. She did try Benytl and Levsin and it helps some. Pt can't miss anymore work, so she'll wait and see how it goes. She really can't afford the CT know either. She will call if she worsens.

## 2013-03-25 ENCOUNTER — Encounter: Payer: Self-pay | Admitting: Internal Medicine

## 2013-03-27 ENCOUNTER — Telehealth: Payer: Self-pay | Admitting: *Deleted

## 2013-03-27 NOTE — Telephone Encounter (Signed)
Pt reports she is still having problems and doesn't really want a CT until she discusses it more with Dr Hilarie Fredrickson. Pt will see Dr Hilarie Fredrickson on 03/31/13.

## 2013-03-30 ENCOUNTER — Encounter: Payer: Self-pay | Admitting: Internal Medicine

## 2013-03-31 ENCOUNTER — Ambulatory Visit (INDEPENDENT_AMBULATORY_CARE_PROVIDER_SITE_OTHER): Payer: 59 | Admitting: Internal Medicine

## 2013-03-31 ENCOUNTER — Encounter: Payer: Self-pay | Admitting: Internal Medicine

## 2013-03-31 VITALS — BP 132/74 | HR 80 | Ht 65.0 in | Wt 210.0 lb

## 2013-03-31 DIAGNOSIS — K5900 Constipation, unspecified: Secondary | ICD-10-CM

## 2013-03-31 DIAGNOSIS — K219 Gastro-esophageal reflux disease without esophagitis: Secondary | ICD-10-CM | POA: Insufficient documentation

## 2013-03-31 DIAGNOSIS — Z9049 Acquired absence of other specified parts of digestive tract: Secondary | ICD-10-CM

## 2013-03-31 DIAGNOSIS — R109 Unspecified abdominal pain: Secondary | ICD-10-CM

## 2013-03-31 DIAGNOSIS — Z9889 Other specified postprocedural states: Secondary | ICD-10-CM

## 2013-03-31 MED ORDER — AMITRIPTYLINE HCL 25 MG PO TABS: 12.5000 mg | ORAL_TABLET | Freq: Every day | ORAL | Status: DC

## 2013-03-31 NOTE — Progress Notes (Signed)
Subjective:    Patient ID: [name], female    DOB: July 02, 1965, 48 y.o.   MRN: [id number]  HPI [name] is a 48 yo female with PMH of diverticulosis with diverticulitis status post left hemicolectomy December 2012, recurrent diverticulitis after resection requiring hospitalization complicated by C. difficile colitis, history of bilateral renal stones, chronic lower back pain, GERD, ADHD who is seen in followup for chronic left sided abdominal pain. She is here alone today. She came for upper endoscopy and colonoscopy after her initial consultation. Procedures were performed on 03/18/2013. Colonoscopy to the cecum revealed 2 sessile 5 mm descending colon polyps which were removed and found to be adenomatous. She had mild diverticulosis in the descending colon and a normal/healthy appearing sigmoid anastomosis. No findings to explain chronic left-sided abdominal pain. Upper endoscopy performed on the same day revealed a variable Z line which was biopsied, a 2 cm hiatal hernia, reactive gastropathy in the antrum, and sessile proximal gastric polyps. The duodenum was unremarkable. Biopsies from the stomach showed moderate chronic inactive gastritis without H. pylori or metaplasia/dysplasia. The gastric polyps were fundic gland in nature without dysplasia. In the GE junction biopsy was benign with chronic inflammation and reactive changes but no Barrett's esophagus.  After her procedure she had developed some mid abdominal pain which she described as "rolling". This has slowly improved. She has been using Bentyl for this pain which helped. She continues to have her left sided abdominal pain which is a daily event. She has used tramadol for this. Previously she was using one to 2 tablets daily for her back pain but now is using this nearly 4 times daily for back and left-sided abdominal pain. She is worried about using this medication long-term and about the addictive nature of the medicine.  Bowel movements were more normal after colonoscopy but she has reinstituted MiraLax recently or some mild constipation. She is not having to use MiraLax daily. Previously she tried Linzess which caused diarrhea and abdominal cramping.  Of note she has recently started Lexapro. It was prescribed some time ago for anxiety. She started with 10 mg and has recently in the last several weeks increase to 20 mg daily. She is feeling better with this medication and also noted that her sleep. She is using Ambien daily. She admits to a stressful life recently with work and teenagers..  She noted urinary frequency after the procedure but this has resolved. No foul odor or dysuria.  Review of Systems As per history of present illness, otherwise negative  Current Medications, Allergies, Past Medical History, Past Surgical History, Family History and Social History were reviewed in [employer] record.     Objective:   Physical Exam BP 132/74  Pulse 80  Ht 5\' 5"  (1.651 m)  Wt 210 lb (95.255 kg)  BMI 34.95 kg/m2  LMP 02/08/2013 Constitutional: Well-developed and well-nourished. No distress. HEENT: Normocephalic and atraumatic. Oropharynx is clear and moist. No oropharyngeal exudate. Conjunctivae are normal.  No scleral icterus. Neck: Neck supple. Trachea midline. Cardiovascular: Normal rate, regular rhythm and intact distal pulses.  Pulmonary/chest: Effort normal and breath sounds normal. No wheezing, rales or rhonchi. Abdominal: Soft, mild diffuse tenderness without rebound or guarding, nondistended. Bowel sounds active throughout. Extremities: no clubbing, cyanosis, or edema Neurological: Alert and oriented to person place and time. Skin: Skin is warm and dry. No rashes noted. Psychiatric: Normal mood and affect. Behavior is normal.     Assessment & Plan:  48 yo  female with PMH of diverticulosis with diverticulitis status post left hemicolectomy December 2012, recurrent  diverticulitis after resection requiring hospitalization complicated by C. difficile colitis, history of bilateral renal stones, chronic lower back pain, GERD, ADHD who is seen in followup for chronic left sided abdominal pain.  1.  Chronic, left-sided abdominal pain -- I'm not convinced that her left-sided abdominal pain is colonic in nature. The differential includes adhesive disease from prior surgery, possible ureteral/urologic pain from history of kidney stones and ureteral intervention, and less likely IBS/colonic spasm.  We have tried anti-spasmodic status doesn't particularly help with her left-sided pain. Her bowel movements have been more regular with MiraLax and this hasn't helped the pain either. She's also started Lexapro recently which can help in irritable bowel syndrome, so I remain hopeful that this may provide additional benefit going forward. She is planning to meet with Dr. Jeffie Pollock in urology again in the next several weeks. She also may followup with Dr. Marcello Moores to discuss the possibility of lysis of adhesion. I would like her to wean down tramadol possible. I am going to start her on amitriptyline 12.5 mg each bedtime to try to augment her chronic left-sided pain. We discussed that TCA levels can be elevated by Lexapro and so we are starting with a low dose. We discussed that this medication can make her sleepy and she will take it at bedtime and not using Ambien while she is starting this medication. She will continue with MiraLax to avoid constipation.  2.  Constipation -- she will continue with MiraLax on an as needed basis  3.  GERD/gastric polyps -- symptoms well controlled on omeprazole 40 mg twice daily. We will continue this dose for now, as she has some breakthrough heartburn when taking only once daily. No evidence of Barrett's esophagus. Gastric polyps were fundic gland and benign.  Follow-up in 12 weeks

## 2013-03-31 NOTE — Patient Instructions (Signed)
We have sent medications to your pharmacy for you to pick up at your convenience. Continue Miralax. Hold Ambien when starting your amitriptyline this can cause you to be sleepy. Please follow up with your Urologist. Follow up with Dr Hilarie Fredrickson in 12 weeks. CC:  Karma Greaser MD

## 2013-05-15 ENCOUNTER — Ambulatory Visit: Payer: 59 | Admitting: Neurology

## 2013-05-19 ENCOUNTER — Encounter: Payer: Self-pay | Admitting: Neurology

## 2013-05-19 ENCOUNTER — Ambulatory Visit (INDEPENDENT_AMBULATORY_CARE_PROVIDER_SITE_OTHER): Payer: 59 | Admitting: Neurology

## 2013-05-19 VITALS — BP 134/90 | HR 86 | Temp 98.4°F | Ht 65.35 in | Wt 213.1 lb

## 2013-05-19 DIAGNOSIS — G5620 Lesion of ulnar nerve, unspecified upper limb: Secondary | ICD-10-CM

## 2013-05-19 DIAGNOSIS — G609 Hereditary and idiopathic neuropathy, unspecified: Secondary | ICD-10-CM

## 2013-05-19 LAB — VITAMIN B12: VITAMIN B 12: 560 pg/mL (ref 211–911)

## 2013-05-19 NOTE — Progress Notes (Signed)
[facility] Clinic Note - Initial Visit   Date: 05/19/2013    [name] MRN: [id number] DOB: 07-Feb-1966   Dear Teresa Nine, NP:  Thank you for your kind referral of [name] for consultation of paresthesias. Although her history is well known to you, please allow [place] to reiterate it for the purpose of our medical record. The patient was accompanied to the clinic by self.    History of Present Illness: [name] is a 48 y.o. right-handed Caucasian female with history of GERD, diverticulitis s/p left hemicolectomy (12/2/012), ADHD, depression, irregular heat rare, and chronic left sided abdominal pain presenting for evaluation of feet pain.    Starting in 2011, she developed slowly progressive bilateral foot pain, described as "cold sensation, stabbing pain, itching".  She was also having problems with diverticulosis at the same time and was on a series of antibiotics including ciprofloxacin and flagyl.  She feels that the symptoms came on after taking flagyl.  Symptoms are worse at rest and when she goes to bed.  Denies any alleviating factors, besides neurontin (recently started).  Around October 2014, she started noticing dull sensation over the last two fingers which has been intermittent.  Denies sleeping with arms flexed, but she reports driving a lot and resting her arms on the arm rest frequently.    Since onset, symptoms have steadily worsened, [name] she talked to her PCP about it.  She was started neurontin 600mg  qhs in January which has resolved the stabbing pain and itching.  She continues to have cold sensation and numbness.    No back pain or bowel/bladder incontinence.  Out-side paper records, electronic medical record, and images have been reviewed where available and summarized as:  Labs 03/11/2013:  TSH 1.320, Na 137, K 4.5, Chl 98, Cr 0.69  MRI lumbar spine 04/22/2010: 1. Very shallow broad-based foraminal and  extraforaminal disc  protrusion at L3-4.  2. 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  3. Shallow broad-based right foraminal disc protrusion at L4-5  potentially irritating the right L4 nerve root.    Past Medical History  Diagnosis Date  . Diverticulosis   . Chronic back pain   . ADHD (attention deficit hyperactivity disorder)   . Palpitations   . Nephrolithiasis   . Right ureteral stone 09/14/2012  . PONV (postoperative nausea and vomiting)   . GERD (gastroesophageal reflux disease)   . Gastritis     Colonoscopy 03/2013  . Tubular adenoma     colonoscopy 03/2013  . ADHD (attention deficit hyperactivity disorder)   . Depression     Past Surgical History  Procedure Laterality Date  . Partial colectomy    . Cystoscopy with retrograde pyelogram, ureteroscopy and stent placement    . Cystoscopy with retrograde pyelogram, ureteroscopy and stent placement    . Cystoscopy with retrograde pyelogram, ureteroscopy and stent placement    . Cholecystectomy    . Anterior cruciate ligament repair    . Anterior cruciate ligament repair    . Wrist surgery    . Cystoscopy with ureteroscopy N/A 09/14/2012    Procedure: CYSTOSCOPY WITH URETEROSCOPY, with  stent placement;  Surgeon: Teresa So, MD;  Location: [facility];  Service: Urology;  Laterality: N/A;  . Colonoscopy  03/2013     Medications:  Current Outpatient Prescriptions on File Prior to Visit  Medication Sig Dispense Refill  . albuterol (PROVENTIL HFA;VENTOLIN HFA) 108 (90 BASE) MCG/ACT inhaler Inhale 2 puffs into the  lungs every 4 (four) hours as needed (for bronchitis).      [name] Kitchen amitriptyline ([name]) 25 MG tablet Take 0.5 tablets (12.5 mg total) by mouth at bedtime.  30 tablet  3  . amphetamine-dextroamphetamine (ADDERALL XR) 20 MG 24 hr capsule Take 20 mg by mouth every morning.       . diphenhydrAMINE (BENADRYL) 25 mg capsule Take 25 mg by mouth every 6 (six) hours as needed for itching.      . escitalopram (LEXAPRO) 20  MG tablet Take 20 mg by mouth daily.      . hydrocortisone valerate cream (WESTCORT) 0.2 % Apply 1 application topically 4 (four) times daily as needed (for eczema/psoriasis on face).      . methocarbamol (ROBAXIN) 500 MG tablet Take 1,000-1,500 mg by mouth at bedtime.       . metoprolol tartrate (LOPRESSOR) 25 MG tablet Take 25 mg by mouth 2 (two) times daily.      [name] Kitchen omeprazole (PRILOSEC) 40 MG capsule Take 40 mg by mouth 2 (two) times daily.      . ondansetron (ZOFRAN-ODT) 8 MG disintegrating tablet Take 8 mg by mouth 2 (two) times daily as needed for nausea.      . phenazopyridine (PYRIDIUM) 200 MG tablet Take 1 tablet (200 mg total) by mouth 3 (three) times daily as needed for pain.  30 tablet  0  . polyethylene glycol (MIRALAX / GLYCOLAX) packet Take 17 g by mouth every morning.      . Probiotic Product (PROBIOTIC PO) Take 1 capsule by mouth daily. Digestive Advantage      . traMADol (ULTRAM) 50 MG tablet Take 50-100 mg by mouth 3 (three) times daily as needed for pain.      . Vitamin D, Ergocalciferol, (DRISDOL) 50000 UNITS CAPS Take 50,000 Units by mouth every 7 (seven) days. On Saturdays      . zolpidem (AMBIEN) 10 MG tablet Take 10 mg by mouth at bedtime as needed for sleep.       No current facility-administered medications on file prior to visit.    Allergies:  Allergies  Allergen Reactions  . Ciprofloxacin Other (See Comments)    Joint inflammation  . Flagyl [Metronidazole] Other (See Comments)    Doesn't remember reaction but should not take  . Ivp Dye [Iodinated Diagnostic Agents] Hives  . Other Itching    Narcotic pain medicines cause itching but can be managed with benadryl  . Metoclopramide Palpitations    Family History: Family History  Problem Relation Age of Onset  . Osteoarthritis Mother   . Arthritis Mother   . Asthma Mother   . Osteoarthritis Maternal Grandmother   . Osteoarthritis Maternal Grandfather   . ADD / ADHD Son   . ADD / ADHD Daughter      Social History: History   Social History  . Marital Status: Married    Spouse Name: N/A    Number of Children: 2  . Years of Education: N/A   Occupational History  . RN    Social History Main Topics  . Smoking status: Never Smoker   . Smokeless tobacco: Never Used  . Alcohol Use: 0 - .5 oz/week    0-1 drink(s) per week     Comment: rarely  . Drug Use: No  . Sexual Activity: No   Other Topics Concern  . Not on file   Social History Narrative   Lives with husband and two children.   She is currently working as  a home health nurse.          Review of Systems:  CONSTITUTIONAL: No fevers, chills, night sweats, or weight loss.   EYES: No visual changes or eye pain ENT: No hearing changes.  No history of nose bleeds.   RESPIRATORY: No cough, wheezing and shortness of breath.   CARDIOVASCULAR: Negative for chest pain, and palpitations.   GI: + abdominal discomfort, no blood in stools or black stools.  No recent change in bowel habits.   GU:  No history of incontinence.   MUSCLOSKELETAL: No history of joint pain or swelling.  No myalgias.   SKIN: Negative for lesions, rash, and itching.   HEMATOLOGY/ONCOLOGY: Negative for prolonged bleeding, bruising easily, and swollen nodes.    ENDOCRINE: Negative for cold or heat intolerance, polydipsia or goiter.   PSYCH:  +depression or anxiety symptoms.   NEURO: As Above.   Vital Signs:  BP 134/90  Pulse 86  Temp(Src) 98.4 F (36.9 C)  Ht 5' 5.35" (1.66 m)  Wt 213 lb 1 oz (96.645 kg)  BMI 35.07 kg/m2  SpO2 98%  LMP 02/08/2013   Neurological Exam: MENTAL STATUS including orientation to time, place, person, recent and remote memory, attention span and concentration, language, and fund of knowledge is normal.  Speech is not dysarthric.  CRANIAL NERVES: II:  No visual field defects.  Unremarkable fundi.   III-IV-VI: Pupils equal round and reactive to light.  Normal conjugate, extra-ocular eye movements in all directions  of gaze.  No nystagmus.  No ptosis.   V:  Normal facial sensation.  VII:  Normal facial symmetry and movements.   VIII:  Normal hearing and vestibular function.   IX-X:  Normal palatal movement.   XI:  Normal shoulder shrug and head rotation.   XII:  Normal tongue strength and range of motion, no deviation or fasciculation.  MOTOR:  No atrophy, fasciculations or abnormal movements.  No pronator drift.  Tone is normal.    Right Upper Extremity:    Left Upper Extremity:    Deltoid  5/5   Deltoid  5/5   Biceps  5/5   Biceps  5/5   Triceps  5/5   Triceps  5/5   Wrist extensors  5/5   Wrist extensors  5/5   Wrist flexors  5/5   Wrist flexors  5/5   Finger extensors  5/5   Finger extensors  5/5   Finger flexors  5/5   Finger flexors  5/5   Dorsal interossei  5/5   Dorsal interossei  5/5   Abductor pollicis  5/5   Abductor pollicis  5/5   Tone (Ashworth scale)  0  Tone (Ashworth scale)  0   Right Lower Extremity:    Left Lower Extremity:    Hip flexors  5/5   Hip flexors  5/5   Hip extensors  5/5   Hip extensors  5/5   Knee flexors  5/5   Knee flexors  5/5   Knee extensors  5/5   Knee extensors  5/5   Dorsiflexors  5/5   Dorsiflexors  5/5   Plantarflexors  5/5   Plantarflexors  5/5   Toe extensors  5/5   Toe extensors  5/5   Toe flexors  5/5   Toe flexors  5/5   Tone (Ashworth scale)  0  Tone (Ashworth scale)  0   MSRs:  Right  Left brachioradialis 2+  brachioradialis 2+  biceps 2+  biceps 2+  triceps 2+  triceps 2+  patellar 2+  patellar 2+  ankle jerk 2+  ankle jerk 2+  Hoffman no  Hoffman no  plantar response down  plantar response down   SENSORY:  Normal and symmetric perception of light touch, pinprick, vibration, and proprioception.  Romberg's sign absent.   COORDINATION/GAIT: Normal finger-to- nose-finger and heel-to-shin.  Intact rapid alternating movements bilaterally.  Able to rise from a chair without  using arms.  Gait narrow based and stable. Tandem and stressed gait intact.    IMPRESSION: Mrs. Elefante is 48 year-old female presenting for evaluation of paresthesias of her feet and hands.  Her neurological examination is entirely normal and non-focal. By history, her symptoms are suggestive of peripheral neuropathy and especially since pain has improved after starting neurontin.  I discussed that EMG can be performed to better localize symptoms, but given the relatively mild symptoms, I do not feel very strongly about it, since it would not change management.  It is possible that flagyl is a contributing factor, as symptoms started around the same time, however, neuropathy is typically detected in patients on chronic therapy and less reported in those taking large doses for acute infections.  I will check labs looking for other potential etiologies of neuropathy.  In the meantime, management is symptomatic and she seems happy with the current regimen of neurontin.  She is also taking effexor and amitriptyline for abdominal pain, which are both used as neuralgesic medications also.    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  EMG can be done if symptoms do not improve, as patient deferred testing at this time.   PLAN/RECOMMENDATIONS:  1.  Check glucose tolerance test - 2hr, celiac panel, copper, SPEP/UPEP with IFE, vitamin B12 2.  Continue neurontin 600mg  at bedtime. If symptoms worsen, she can add a morning dose of neurontin 300mg . 3.  Recommend using elbow pads and avoiding repetitive trauma to elbows 4.  Return to clinic in 95-months, or sooner as needed   The duration of this appointment visit was 45 minutes of face-to-face time with the patient.  Greater than 50% of this time was spent in counseling, explanation of diagnosis, planning of further management, and coordination of care.   Thank you for  allowing me to participate in patient's care.  If I can answer any additional questions, I would be pleased to do [name].    Sincerely,    Shuan Statzer K. Posey Pronto, DO

## 2013-05-19 NOTE — Patient Instructions (Signed)
1.  Check blood work today 2.  Continue neurontin 600mg  at bedtime, but if symptoms worsen, I discussed that she can add a morning dose of neurontin 300mg . 3.  Recommend using elbow pads and avoiding repetitive trauma to elbows 4.  Return to clinic in 28-months or sooner

## 2013-05-20 LAB — GLIADIN ANTIBODIES, SERUM
GLIADIN IGA: 3.9 U/mL (ref <20)
Gliadin IgG: 5.4 U/mL (ref <20)

## 2013-05-20 LAB — TISSUE TRANSGLUTAMINASE, IGA: TISSUE TRANSGLUTAMINASE AB, IGA: 3.8 U/mL (ref <20)

## 2013-05-20 NOTE — Addendum Note (Signed)
Addended by: Melissa Noon C on: 05/20/2013 10:06 AM   Modules accepted: Orders

## 2013-05-21 LAB — COPPER, SERUM: COPPER: 110 ug/dL (ref 70–175)

## 2013-05-21 LAB — SPEP & IFE WITH QIG
ALPHA-2-GLOBULIN: 10.8 % (ref 7.1–11.8)
Albumin ELP: 61.9 % (ref 55.8–66.1)
Alpha-1-Globulin: 4.2 % (ref 2.9–4.9)
BETA GLOBULIN: 7.6 % — AB (ref 4.7–7.2)
Beta 2: 5.1 % (ref 3.2–6.5)
Gamma Globulin: 10.4 % — ABNORMAL LOW (ref 11.1–18.8)
IGA: 272 mg/dL (ref 69–380)
IgG (Immunoglobin G), Serum: 903 mg/dL (ref 690–1700)
IgM, Serum: 69 mg/dL (ref 52–322)
Total Protein, Serum Electrophoresis: 7.5 g/dL (ref 6.0–8.3)

## 2013-05-25 LAB — RETICULIN ANTIBODIES, IGA W TITER: RETICULIN AB, IGA: NEGATIVE

## 2013-06-24 ENCOUNTER — Encounter: Payer: Self-pay | Admitting: Neurology

## 2013-07-23 ENCOUNTER — Telehealth: Payer: Self-pay | Admitting: Internal Medicine

## 2013-07-24 ENCOUNTER — Encounter (HOSPITAL_COMMUNITY): Payer: Self-pay | Admitting: Emergency Medicine

## 2013-07-24 ENCOUNTER — Telehealth (INDEPENDENT_AMBULATORY_CARE_PROVIDER_SITE_OTHER): Payer: Self-pay

## 2013-07-24 ENCOUNTER — Emergency Department (HOSPITAL_COMMUNITY)
Admission: EM | Admit: 2013-07-24 | Discharge: 2013-07-24 | Disposition: A | Discharge status: Home / Self Care | Payer: 59 | Attending: Emergency Medicine | Admitting: Emergency Medicine

## 2013-07-24 DIAGNOSIS — Y93G9 Activity, other involving cooking and grilling: Secondary | ICD-10-CM | POA: Insufficient documentation

## 2013-07-24 DIAGNOSIS — Z79899 Other long term (current) drug therapy: Secondary | ICD-10-CM | POA: Insufficient documentation

## 2013-07-24 DIAGNOSIS — W261XXA Contact with sword or dagger, initial encounter: Secondary | ICD-10-CM

## 2013-07-24 DIAGNOSIS — E669 Obesity, unspecified: Secondary | ICD-10-CM | POA: Insufficient documentation

## 2013-07-24 DIAGNOSIS — Y92009 Unspecified place in unspecified non-institutional (private) residence as the place of occurrence of the external cause: Secondary | ICD-10-CM | POA: Insufficient documentation

## 2013-07-24 DIAGNOSIS — W260XXA Contact with knife, initial encounter: Secondary | ICD-10-CM | POA: Insufficient documentation

## 2013-07-24 DIAGNOSIS — Z87448 Personal history of other diseases of urinary system: Secondary | ICD-10-CM | POA: Insufficient documentation

## 2013-07-24 DIAGNOSIS — F909 Attention-deficit hyperactivity disorder, unspecified type: Secondary | ICD-10-CM | POA: Insufficient documentation

## 2013-07-24 DIAGNOSIS — S61409A Unspecified open wound of unspecified hand, initial encounter: Secondary | ICD-10-CM | POA: Insufficient documentation

## 2013-07-24 DIAGNOSIS — F3289 Other specified depressive episodes: Secondary | ICD-10-CM | POA: Insufficient documentation

## 2013-07-24 DIAGNOSIS — F329 Major depressive disorder, single episode, unspecified: Secondary | ICD-10-CM | POA: Insufficient documentation

## 2013-07-24 DIAGNOSIS — S61419A Laceration without foreign body of unspecified hand, initial encounter: Secondary | ICD-10-CM

## 2013-07-24 DIAGNOSIS — K219 Gastro-esophageal reflux disease without esophagitis: Secondary | ICD-10-CM | POA: Insufficient documentation

## 2013-07-24 DIAGNOSIS — G8929 Other chronic pain: Secondary | ICD-10-CM | POA: Insufficient documentation

## 2013-07-24 DIAGNOSIS — Z87442 Personal history of urinary calculi: Secondary | ICD-10-CM | POA: Insufficient documentation

## 2013-07-24 NOTE — ED Notes (Signed)
Pt alert, arrives from home, c/o left hand puncture wound, onset was today, <0.5 cm laceration noted, no bleeding noted

## 2013-07-24 NOTE — ED Provider Notes (Signed)
Medical screening examination/treatment/procedure(s) were performed by non-physician practitioner and as supervising physician I was immediately available for consultation/collaboration.   EKG Interpretation None       Jasper Riling. Alvino Chapel, MD 07/24/13 [phone]

## 2013-07-24 NOTE — Telephone Encounter (Signed)
Pt called stating she is still have left side abd pain. Pt states she has been worked up by Dr Hilarie Fredrickson and Dr Jeffie Pollock. Per Dr Vena Rua note pt is to be directed back to Dr Marcello Moores. Pt want to be seen sooner than any appt I can offer. I advised pt I will send this msg for review by Dr Marcello Moores and Holley Raring. Pt will be sure the notes from Dr Jeffie Pollock will be sent to be scanned also. Pt can be reached at [phone].

## 2013-07-24 NOTE — Discharge Instructions (Signed)
Please read and follow all provided instructions.  Your diagnoses today include:  1. Hand laceration     Tests performed today include:  Vital signs. See below for your results today.   Medications prescribed:   None  Take any prescribed medications only as directed.   Home care instructions:  Follow any educational materials and wound care instructions contained in this packet.   Keep affected area above the level of your heart when possible to minimize swelling. Wash area gently twice a day with warm soapy water. Do not apply alcohol or hydrogen peroxide. Cover the area if it draining or weeping.   Follow-up instructions: Suture Removal: Return to the Emergency Department or see your primary care care doctor in 7-10 days for a recheck of your wound and removal of your sutures or staples.    If you do not have a primary care doctor -- see below for referral information.   Return instructions:  Return to the Emergency Department if you have:  Fever  Worsening pain  Worsening swelling of the wound  Pus draining from the wound  Redness of the skin that moves away from the wound, especially if it streaks away from the affected area   Any other emergent concerns  Your vital signs today were: BP 147/88   Pulse 98   Temp(Src) 98 F (36.7 C) (Oral)   Resp 16   Wt 214 lb (97.07 kg)   SpO2 99%   LMP 02/08/2013 If your blood pressure (BP) was elevated above 135/85 this visit, please have this repeated by your doctor within one month. --------------

## 2013-07-24 NOTE — ED Provider Notes (Signed)
CSN: [id number]     Arrival date & time 07/24/13  1819 History  This chart was scribed for non-physician practitioner, Alecia Lemming, PA-C,working with Jasper Riling. Alvino Chapel, MD, by Marlowe Kays, ED Scribe.  This patient was seen in room [place] and the patient's care was started at 7:31 PM.  Chief Complaint  Patient presents with  . Laceration   The history is provided by the patient. No language interpreter was used.   HPI Comments:  [name] is a 48 y.o. obese female who presents to the Emergency Department complaining of a left hand puncture wound that occurred approximately one hour ago. She states she was trying to pry a brownie out of a pan with a clean steak knife at her house and stabbed herself in the palm and cut herself. 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 Cleaned with water PTA. She was concerned because she noticed blood shooting from wound.   Past Medical History  Diagnosis Date  . Diverticulosis   . Chronic back pain   . ADHD (attention deficit hyperactivity disorder)   . Palpitations   . Nephrolithiasis   . Right ureteral stone 09/14/2012  . PONV (postoperative nausea and vomiting)   . GERD (gastroesophageal reflux disease)   . Gastritis     Colonoscopy 03/2013  . Tubular adenoma     colonoscopy 03/2013  . ADHD (attention deficit hyperactivity disorder)   . Depression    Past Surgical History  Procedure Laterality Date  . Partial colectomy    . Cystoscopy with retrograde pyelogram, ureteroscopy and stent placement    . Cystoscopy with retrograde pyelogram, ureteroscopy and stent placement    . Cystoscopy with retrograde pyelogram, ureteroscopy and stent placement    . Cholecystectomy    . Anterior cruciate ligament repair    . Anterior cruciate ligament repair    . Wrist surgery    . Cystoscopy with ureteroscopy N/A 09/14/2012    Procedure: CYSTOSCOPY WITH  URETEROSCOPY, with  stent placement;  Surgeon: Malka So, MD;  Location: [facility];  Service: Urology;  Laterality: N/A;  . Colonoscopy  03/2013   Family History  Problem Relation Age of Onset  . Osteoarthritis Mother   . Arthritis Mother   . Asthma Mother   . Osteoarthritis Maternal Grandmother   . Osteoarthritis Maternal Grandfather   . ADD / ADHD Son   . ADD / ADHD Daughter    History  Substance Use Topics  . Smoking status: Never Smoker   . Smokeless tobacco: Never Used  . Alcohol Use: 0.0 - 0.5 oz/week    0-1 drink(s) per week     Comment: rarely   OB History   Grav Para Term Preterm Abortions TAB SAB Ect Mult Living   2 2 2             Review of Systems  Constitutional: Negative for activity change.  Musculoskeletal: Positive for arthralgias. Negative for back pain, joint swelling and neck pain.  Skin: Positive for wound (left hand).  Neurological: Negative for weakness and numbness.    Allergies  Ciprofloxacin; Flagyl; Ivp dye; Other; and Metoclopramide  Home Medications   Prior to Admission medications   Medication Sig Start Date End Date Taking? Authorizing Provider  albuterol (PROVENTIL HFA;VENTOLIN HFA) 108 (90 BASE) MCG/ACT inhaler Inhale 2 puffs into the lungs every 4 (four) hours as needed (for bronchitis).  Historical Provider, MD  amitriptyline ([name]) 25 MG tablet Take 0.5 tablets (12.5 mg total) by mouth at bedtime. 03/31/13   Jerene Bears, MD  amphetamine-dextroamphetamine (ADDERALL XR) 20 MG 24 hr capsule Take 20 mg by mouth every morning.     Historical Provider, MD  diphenhydrAMINE (BENADRYL) 25 mg capsule Take 25 mg by mouth every 6 (six) hours as needed for itching.    Historical Provider, MD  escitalopram (LEXAPRO) 20 MG tablet Take 20 mg by mouth daily.    Historical Provider, MD  hydrocortisone valerate cream (WESTCORT) 0.2 % Apply 1 application topically 4 (four) times daily as needed (for eczema/psoriasis on face).    Historical Provider, MD   methocarbamol (ROBAXIN) 500 MG tablet Take 1,000-1,500 mg by mouth at bedtime.     Historical Provider, MD  metoprolol tartrate (LOPRESSOR) 25 MG tablet Take 25 mg by mouth 2 (two) times daily.    Historical Provider, MD  omeprazole (PRILOSEC) 40 MG capsule Take 40 mg by mouth 2 (two) times daily.    Historical Provider, MD  ondansetron (ZOFRAN-ODT) 8 MG disintegrating tablet Take 8 mg by mouth 2 (two) times daily as needed for nausea.    Historical Provider, MD  phenazopyridine (PYRIDIUM) 200 MG tablet Take 1 tablet (200 mg total) by mouth 3 (three) times daily as needed for pain. 09/14/12   Malka So, MD  polyethylene glycol (MIRALAX / Floria Raveling) packet Take 17 g by mouth every morning.    Historical Provider, MD  Probiotic Product (PROBIOTIC PO) Take 1 capsule by mouth daily. Digestive Advantage    Historical Provider, MD  traMADol (ULTRAM) 50 MG tablet Take 50-100 mg by mouth 3 (three) times daily as needed for pain.    Historical Provider, MD  Vitamin D, Ergocalciferol, (DRISDOL) 50000 UNITS CAPS Take 50,000 Units by mouth every 7 (seven) days. On Saturdays    Historical Provider, MD  zolpidem (AMBIEN) 10 MG tablet Take 10 mg by mouth at bedtime as needed for sleep.    Historical Provider, MD   Triage Vitals: BP 147/88  Pulse 98  Temp(Src) 98 F (36.7 C) (Oral)  Resp 16  Wt 214 lb (97.07 kg)  SpO2 99%  LMP 02/08/2013 Physical Exam  Nursing note and vitals reviewed. Constitutional: She appears well-developed and well-nourished.  HENT:  Head: Normocephalic and atraumatic.  Eyes: EOM are normal. Pupils are equal, round, and reactive to light.  Neck: Normal range of motion. Neck supple.  Cardiovascular: Normal rate.  Exam reveals no decreased pulses.   Pulses:      Radial pulses are 2+ on the right side, and 2+ on the left side.  Pulmonary/Chest: Effort normal.  Musculoskeletal: Normal range of motion. She exhibits tenderness. She exhibits no edema.       Hands: Neurological: She  is alert. No sensory deficit.  Motor, sensation, and vascular distal to the injury is fully intact.   Skin: Skin is warm and dry.  Psychiatric: She has a normal mood and affect. Her behavior is normal.    ED Course  Procedures (including critical care time) DIAGNOSTIC STUDIES: Oxygen Saturation is 99% on RA, normal by my interpretation.   COORDINATION OF CARE: 7:34 PM- Will suture laceration. Pt verbalizes understanding and agrees to plan.  LACERATION REPAIR PROCEDURE NOTE The patient's identification was confirmed and consent was obtained. This procedure was performed by Alecia Lemming, PA-C at 7:44 PM. Site: L palm Sterile procedures observed Anesthetic used (type and amt): Lidocaine 2% with epi  Suture type/size: Ethilon 5-0 Length: 4mm # of Sutures: 2 Technique: simple interrupted Complexity: simple Antibx ointment applied Tetanus declined Site anesthetized, irrigated with NS, explored without evidence of foreign body, wound well approximated, site covered with dry, sterile dressing.  Patient tolerated procedure well without complications. Instructions for care discussed verbally and patient provided with additional written instructions for homecare and f/u.   Medications - No data to display  Labs Review Labs Reviewed - No data to display  Imaging Review No results found.   EKG Interpretation None      8:11 PM Patient counseled on wound care. Patient counseled on need to return or see PCP/urgent care for suture removal in 7-10 days. Patient was urged to return to the Emergency Department urgently with worsening pain, swelling, expanding erythema especially if it streaks away from the affected area, fever, or if they have any other concerns. Patient verbalized understanding.    She defers x-ray of finger.   MDM   Final diagnoses:  Hand laceration   Hand lac. Extends towards flexor tendon although I do not visualize disruption and patient and normal strength in  digit. Cannot rule out partial disruption. Will have patient f/u with ortho if problems healing or with weakness. No FB seen palpated. She has good ROM. Finger neurovascularly intact with normal cap refill.   I personally performed the services described in this documentation, which was scribed in my presence. The recorded information has been reviewed and is accurate.    Carlisle Cater, PA-C 07/24/13 2013

## 2013-07-24 NOTE — Telephone Encounter (Signed)
Patient reports that she has worsening abdominal pain.  She was seen by Dr. Jeffie Pollock yesterday and he ruled out a kidney stone with a CT scan (she is asked to contact them to send [place] the note and scan).  She is encouraged to contact Dr. Marcello Moores as the last office note indicates to discuss lysis of adhesions. Dr. Hilarie Fredrickson your next available is not until July 7.  She states the pain is on her left lower abdomen and radiates around to her back, the pain is a burning type pain.  Please advise additional orders/recommendations.

## 2013-07-27 NOTE — Telephone Encounter (Signed)
Patient notified She has an appt with Dr. Marcello Moores on 6/5 She will come in and see Tye Savoy RNP tomorrow at 2:00 She is advised about bentyl and Miralax

## 2013-07-27 NOTE — Telephone Encounter (Signed)
No new recommendations. Agree with complete exclusion of kidney stones as this has been problem for her in the past Could again try anti-spasmodic such as Bentyl 10-20 mg 3 times a day when necessary Ensure no constipation with daily MiraLax Could be seen by an APP for urgent visit if needed Could again discuss LOA with Dr. Marcello Moores

## 2013-07-28 ENCOUNTER — Encounter: Payer: Self-pay | Admitting: Nurse Practitioner

## 2013-07-28 ENCOUNTER — Telehealth (INDEPENDENT_AMBULATORY_CARE_PROVIDER_SITE_OTHER): Payer: Self-pay

## 2013-07-28 ENCOUNTER — Ambulatory Visit (INDEPENDENT_AMBULATORY_CARE_PROVIDER_SITE_OTHER): Payer: 59 | Admitting: Nurse Practitioner

## 2013-07-28 VITALS — BP 120/72 | HR 76 | Ht 65.35 in | Wt 218.4 lb

## 2013-07-28 DIAGNOSIS — R109 Unspecified abdominal pain: Secondary | ICD-10-CM

## 2013-07-28 DIAGNOSIS — F32A Depression, unspecified: Secondary | ICD-10-CM

## 2013-07-28 DIAGNOSIS — R5381 Other malaise: Secondary | ICD-10-CM

## 2013-07-28 DIAGNOSIS — R5383 Other fatigue: Secondary | ICD-10-CM

## 2013-07-28 DIAGNOSIS — F329 Major depressive disorder, single episode, unspecified: Secondary | ICD-10-CM

## 2013-07-28 DIAGNOSIS — F3289 Other specified depressive episodes: Secondary | ICD-10-CM

## 2013-07-28 NOTE — Progress Notes (Addendum)
      History of Present Illness:   Patient is a 48 year old female known to Dr. Hilarie Fredrickson. She has a history of diverticulosis with diverticulitis, status post left hemicolectomy. 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 Patient was evaluated here at the request of Dr. Barry Dienes December 2014  for left-sided abdominal pain and constipation. We prescribed Linzess and bowel anti-spasmotics. Colonoscopy was done and there were no acute findings. Surgical anastomosis appeared healthy. A couple of adenomatous polyps were removed. She also underwent upper endoscopy which showed mild gastropathy and a 2 cm hiatal hernia. Multiple sessile polyps were found in the stomach as well. Gastric pathology compatible with moderate chronic and active gastritis, no H. pylori. Gastric polyps were fundic gland polyps. Endoscopically there was question of Barrett's but biopsies revealed only chronic inflammation. Patient came back in late January with ongoing left-sided abdominal pain. Antispasmodics we prescribed were not helpful. Patient had started Lexapro which we thought might be helpful for possible irritable bowel syndrome. We added a low dose of [name].  Patient returns today with ongoing left sided abdominal pain. The SSRI and TCA did help make the daily left sided pain more manageable. Two days ago she had an escalation of the pain on the way to work. Patient thought she had another kidney stone but noncontrast CTscan (stone protocol was negative). Patient has also developed some recent nausea. She is having regular bowel movements. She has a follow up with surgeon early next month.   Patient is frustrated / depressed about this chronic abdominal pain. She is constantly tired and finds it difficult to work and get anything done at home. She relates that this left sided abdominal pain has been present for years, even prior to colon resection.   Current Medications, Allergies, Past  Medical History, Past Surgical History, Family History and Social History were reviewed in [employer] record.  Physical Exam: General: Pleasant, well developed , white female in no acute distress Head: Normocephalic and atraumatic Eyes:  sclerae anicteric, conjunctiva pink  Ears: Normal auditory acuity Lungs: Clear throughout to auscultation Heart: Regular rate and rhythm Abdomen: Soft, obese, mild LUQ and mild LLQ tenderness. No masses, normal bowel sounds Musculoskeletal: Symmetrical with no gross deformities  Extremities: No edema  Neurological: Alert oriented x 4, grossly nonfocal Psychological:  Alert and cooperative. Pleasant but emotional  Assessment and Recommendations:  Complicated 48 year old female with chronic (almost debilitating) left sided abdominal pain of unclear etiology. She has a follow up with surgery in a couple of weeks. Unless surgery feels there are any surgical options for her our recommendations would be to increase [name] to 25mg  daily, consider sending her to [facility] for a second opinion,  and /or referral to pain management.    Addendum: Reviewed and agree with management. Jerene Bears, MD

## 2013-07-28 NOTE — Patient Instructions (Signed)
Tye Savoy NP-C will talk to Dr. Zenovia Jarred and we will get back to you.

## 2013-07-28 NOTE — Telephone Encounter (Signed)
V/M to call for follow up ask for [facility]

## 2013-07-28 NOTE — Telephone Encounter (Signed)
Patient states she is still having Left upper and lower abdomen pain with nausea no vomiting. She had a Abdomen CT today which she states No acute diverticular or kidney stones. Advised patient DR. Thomas soonest appointment is 08-14-13 @ 4:05 p If her  Symptoms becomes  Worse she will need to go to the ED. Patient verbalized understanding

## 2013-07-29 ENCOUNTER — Encounter: Payer: Self-pay | Admitting: Nurse Practitioner

## 2013-07-29 DIAGNOSIS — R5383 Other fatigue: Secondary | ICD-10-CM

## 2013-07-29 DIAGNOSIS — F329 Major depressive disorder, single episode, unspecified: Secondary | ICD-10-CM | POA: Insufficient documentation

## 2013-07-29 DIAGNOSIS — R5381 Other malaise: Secondary | ICD-10-CM | POA: Insufficient documentation

## 2013-07-29 DIAGNOSIS — F32A Depression, unspecified: Secondary | ICD-10-CM | POA: Insufficient documentation

## 2013-07-31 ENCOUNTER — Telehealth: Payer: Self-pay | Admitting: Nurse Practitioner

## 2013-07-31 NOTE — Telephone Encounter (Signed)
Left a message for patient to call me. 

## 2013-07-31 NOTE — Telephone Encounter (Signed)
[name], We can: 1. Try increasing [name] to 25mg  daily and /or 2. Send to [name] for second opinion regarding cause of pain 3. Another option is just to refer to pain management.  IF she is interested in #2 or #3 would likel to wait until next appt with surgery to make sure they agree and don't have anything else to offer patient.   Thanks

## 2013-07-31 NOTE — Telephone Encounter (Signed)
Patient is asking what Tye Savoy, NP found out from Dr. Hilarie Fredrickson

## 2013-08-04 NOTE — Telephone Encounter (Signed)
Gave patient the options. She is taking [name]. She does not want to go to [facility]. She will call back after she sees the surgeon.

## 2013-08-12 ENCOUNTER — Telehealth: Payer: Self-pay | Admitting: Internal Medicine

## 2013-08-12 NOTE — Telephone Encounter (Signed)
I spoke with [name] in Medical records she will try and expedite the paperwork.  I have contacted the patient notified her that we will try and get paperwork done by Friday, but not sure it is possible until we speak with [name].  She and I discussed a work note to her employer and I will call her in the am with this information.

## 2013-08-13 NOTE — Telephone Encounter (Signed)
Patient notified that they were able to fill out her paperwork and we will have Dr. Hilarie Fredrickson fill out his part today and should be able to get by tomorrow

## 2013-08-13 NOTE — Telephone Encounter (Signed)
FMLA paperwork signed by Dr. Hilarie Fredrickson.  Patient notified that it has been sent to [place].

## 2013-08-14 ENCOUNTER — Ambulatory Visit (INDEPENDENT_AMBULATORY_CARE_PROVIDER_SITE_OTHER): Payer: 59 | Admitting: General Surgery

## 2013-08-14 ENCOUNTER — Encounter (INDEPENDENT_AMBULATORY_CARE_PROVIDER_SITE_OTHER): Payer: Self-pay | Admitting: General Surgery

## 2013-08-14 VITALS — BP 150/100 | HR 102 | Temp 91.6°F | Resp 20 | Ht 65.0 in | Wt 220.8 lb

## 2013-08-14 DIAGNOSIS — R1032 Left lower quadrant pain: Secondary | ICD-10-CM

## 2013-08-14 NOTE — Patient Instructions (Signed)
Follow up as needed

## 2013-08-14 NOTE — Progress Notes (Signed)
AUDRIA TAKESHITA is a 48 y.o. female who is here for a follow up visit regarding her abd pain.  She states that it is mainly LLQ and occasionally LUQ.  It radiates to her back.  It is not associated with bowel movements or eating.    Objective: Filed Vitals:   08/14/13 1622  BP: 150/100  Pulse: 102  Temp: 91.6 F (33.1 C)  Resp: 20    General appearance: alert and cooperative GI: normal findings: soft, non-tender   Assessment and Plan: LLQ pain of unknown etiology.  Her symptoms do not suggest a bowel origin.  CT's have shown no diverticulitis or obstruction. Generally bowel pain does not radiate to the back.  I have suggested that she sit down with an internal medical doctor and have all her results evaluated.  I suspect the cause of her symptoms is something more systemic or inflammatory in nature.  I do not see anything surgical to help her.  She appears to understand this.  I will see her back as needed.     Rosario Adie, MD [facility], [place]

## 2013-09-21 ENCOUNTER — Ambulatory Visit: Payer: 59 | Admitting: Family

## 2013-09-21 DIAGNOSIS — Z0289 Encounter for other administrative examinations: Secondary | ICD-10-CM

## 2013-10-07 ENCOUNTER — Telehealth: Payer: Self-pay | Admitting: Internal Medicine

## 2013-10-07 NOTE — Telephone Encounter (Signed)
Patient is scheduled for surgery on this Friday for possible lysis of adhesions.  She is frustrated and wants to discuss treatment for diverticular disease with Dr. Hilarie Fredrickson.  She has found some research that shows mesalamine and rifaxamin have been successful in treating diverticular disease.  She is scheduled for 12/16/13 to discuss with Dr. Hilarie Fredrickson

## 2013-10-25 ENCOUNTER — Emergency Department (HOSPITAL_COMMUNITY)
Admission: EM | Admit: 2013-10-25 | Discharge: 2013-10-26 | Disposition: A | Discharge status: Home / Self Care | Payer: 59 | Attending: Emergency Medicine | Admitting: Emergency Medicine

## 2013-10-25 ENCOUNTER — Encounter (HOSPITAL_COMMUNITY): Payer: Self-pay | Admitting: Emergency Medicine

## 2013-10-25 DIAGNOSIS — K219 Gastro-esophageal reflux disease without esophagitis: Secondary | ICD-10-CM | POA: Insufficient documentation

## 2013-10-25 DIAGNOSIS — R109 Unspecified abdominal pain: Secondary | ICD-10-CM | POA: Insufficient documentation

## 2013-10-25 DIAGNOSIS — Z8719 Personal history of other diseases of the digestive system: Secondary | ICD-10-CM | POA: Diagnosis not present

## 2013-10-25 DIAGNOSIS — N390 Urinary tract infection, site not specified: Secondary | ICD-10-CM | POA: Diagnosis not present

## 2013-10-25 DIAGNOSIS — Z3202 Encounter for pregnancy test, result negative: Secondary | ICD-10-CM | POA: Insufficient documentation

## 2013-10-25 DIAGNOSIS — Z79899 Other long term (current) drug therapy: Secondary | ICD-10-CM | POA: Diagnosis not present

## 2013-10-25 DIAGNOSIS — N200 Calculus of kidney: Secondary | ICD-10-CM

## 2013-10-25 DIAGNOSIS — G8929 Other chronic pain: Secondary | ICD-10-CM | POA: Insufficient documentation

## 2013-10-25 DIAGNOSIS — Z8659 Personal history of other mental and behavioral disorders: Secondary | ICD-10-CM | POA: Diagnosis not present

## 2013-10-25 NOTE — ED Notes (Signed)
Pt presents with c/o left sided abdominal pain that radiates around to her left flank area. Pt has a hx of kidney stones. Pt does report urgency and frequency.

## 2013-10-26 ENCOUNTER — Emergency Department (HOSPITAL_COMMUNITY): Payer: 59

## 2013-10-26 LAB — URINALYSIS, ROUTINE W REFLEX MICROSCOPIC
BILIRUBIN URINE: NEGATIVE
Glucose, UA: NEGATIVE mg/dL
KETONES UR: 15 mg/dL — AB
NITRITE: POSITIVE — AB
PROTEIN: 30 mg/dL — AB
Specific Gravity, Urine: 1.017 (ref 1.005–1.030)
UROBILINOGEN UA: 2 mg/dL — AB (ref 0.0–1.0)
pH: 5 (ref 5.0–8.0)

## 2013-10-26 LAB — POC URINE PREG, ED: Preg Test, Ur: NEGATIVE

## 2013-10-26 LAB — URINE MICROSCOPIC-ADD ON

## 2013-10-26 LAB — PREGNANCY, URINE: Preg Test, Ur: NEGATIVE

## 2013-10-26 MED ORDER — DEXTROSE 5 % IV SOLN
1.0000 g | Freq: Once | INTRAVENOUS | Status: AC
  Administered 2013-10-26: 1 g via INTRAVENOUS
  Filled 2013-10-26: qty 10

## 2013-10-26 MED ORDER — ONDANSETRON HCL 4 MG/2ML IJ SOLN
4.0000 mg | Freq: Once | INTRAMUSCULAR | Status: AC
  Administered 2013-10-26: 4 mg via INTRAVENOUS
  Filled 2013-10-26: qty 2

## 2013-10-26 MED ORDER — ONDANSETRON 4 MG PO TBDP: 4.0000 mg | ORAL_TABLET | Freq: Three times a day (TID) | ORAL | Status: DC | PRN

## 2013-10-26 MED ORDER — HYDROMORPHONE HCL PF 1 MG/ML IJ SOLN
1.0000 mg | Freq: Once | INTRAMUSCULAR | Status: AC
  Administered 2013-10-26: 1 mg via INTRAVENOUS
  Filled 2013-10-26: qty 1

## 2013-10-26 MED ORDER — KETOROLAC TROMETHAMINE 30 MG/ML IJ SOLN
30.0000 mg | Freq: Once | INTRAMUSCULAR | Status: AC
  Administered 2013-10-26: 30 mg via INTRAVENOUS
  Filled 2013-10-26: qty 1

## 2013-10-26 MED ORDER — DIPHENHYDRAMINE HCL 25 MG PO CAPS
25.0000 mg | ORAL_CAPSULE | Freq: Once | ORAL | Status: AC
  Administered 2013-10-26: 25 mg via ORAL
  Filled 2013-10-26: qty 1

## 2013-10-26 MED ORDER — OXYCODONE-ACETAMINOPHEN 5-325 MG PO TABS: 1.0000 | ORAL_TABLET | Freq: Four times a day (QID) | ORAL | Status: DC | PRN

## 2013-10-26 MED ORDER — SULFAMETHOXAZOLE-TRIMETHOPRIM 800-160 MG PO TABS: 1.0000 | ORAL_TABLET | Freq: Two times a day (BID) | ORAL | Status: DC

## 2013-10-26 NOTE — ED Provider Notes (Signed)
CSN: [id number]     Arrival date & time 10/25/13  2336 History   First MD Initiated Contact with Patient 10/26/13 0047     Chief Complaint  Patient presents with  . Flank Pain     (Consider location/radiation/quality/duration/timing/severity/associated sxs/prior Treatment) HPI Comments: This is a 48 year old female with a complicated medical history of chronic left lower quadrant pain, and kidney stones.  States she recently had abdominal surgery, but found she had her fallopian tube and ovary adhered to the bowel wall.  This was repaired, and she felt well ever since.  She reports last night at 2 AM, she developed left flank pain, radiating to the abdomen, consistent with her previous history of kidney stones.  She's tried all of her home medication without relief.  Denies any fever,  Nausea vomiting but does report urgency, and frequency.  Does not report hematuria  Patient is a 48 y.o. female presenting with flank pain. The history is provided by the patient.  Flank Pain This is a recurrent problem. The current episode started yesterday. The problem occurs constantly. The problem has been waxing and waning. Associated symptoms include abdominal pain and nausea. Pertinent negatives include no chills, fever, rash or vomiting. Nothing aggravates the symptoms. She has tried nothing for the symptoms. The treatment provided no relief.    Past Medical History  Diagnosis Date  . Diverticulosis   . Chronic back pain   . ADHD (attention deficit hyperactivity disorder)   . Palpitations   . Nephrolithiasis   . Right ureteral stone 09/14/2012  . PONV (postoperative nausea and vomiting)   . GERD (gastroesophageal reflux disease)   . Gastritis     Colonoscopy 03/2013  . Tubular adenoma     colonoscopy 03/2013  . ADHD (attention deficit hyperactivity disorder)   . Depression    Past Surgical History  Procedure Laterality Date  . Partial colectomy    . Cystoscopy with retrograde pyelogram,  ureteroscopy and stent placement    . Cystoscopy with retrograde pyelogram, ureteroscopy and stent placement    . Cystoscopy with retrograde pyelogram, ureteroscopy and stent placement    . Cholecystectomy    . Anterior cruciate ligament repair    . Anterior cruciate ligament repair    . Wrist surgery    . Cystoscopy with ureteroscopy N/A 09/14/2012    Procedure: CYSTOSCOPY WITH URETEROSCOPY, with  stent placement;  Surgeon: Malka So, MD;  Location: [facility];  Service: Urology;  Laterality: N/A;  . Colonoscopy  03/2013   Family History  Problem Relation Age of Onset  . Osteoarthritis Mother   . Arthritis Mother   . Asthma Mother   . Osteoarthritis Maternal Grandmother   . Osteoarthritis Maternal Grandfather   . ADD / ADHD Son   . ADD / ADHD Daughter   . Colon cancer Neg Hx    History  Substance Use Topics  . Smoking status: Never Smoker   . Smokeless tobacco: Never Used  . Alcohol Use: 0.0 - 0.5 oz/week    0-1 drink(s) per week     Comment: rarely   OB History   Grav Para Term Preterm Abortions TAB SAB Ect Mult Living   2 2 2             Review of Systems  Constitutional: Negative for fever and chills.  Gastrointestinal: Positive for nausea and abdominal pain. Negative for vomiting.  Genitourinary: Positive for flank pain. Negative for dysuria, hematuria and difficulty urinating.  Skin: Negative for rash.  All other systems reviewed and are negative.     Allergies  Ciprofloxacin; Flagyl; Ivp dye; Other; and Metoclopramide  Home Medications   Prior to Admission medications   Medication Sig Start Date End Date Taking? Authorizing Provider  acetaminophen (TYLENOL) 500 MG tablet Take 1,000 mg by mouth every 6 (six) hours as needed for moderate pain.   Yes Historical Provider, MD  albuterol (PROVENTIL HFA;VENTOLIN HFA) 108 (90 BASE) MCG/ACT inhaler Inhale 2 puffs into the lungs every 4 (four) hours as needed (for bronchitis).   Yes Historical Provider, MD    amphetamine-dextroamphetamine (ADDERALL XR) 20 MG 24 hr capsule Take 20 mg by mouth every morning.    Yes Historical Provider, MD  diphenhydrAMINE (BENADRYL) 25 mg capsule Take 25 mg by mouth every 6 (six) hours as needed for itching.   Yes Historical Provider, MD  hydrocortisone valerate cream (WESTCORT) 0.2 % Apply 1 application topically 4 (four) times daily as needed (for eczema/psoriasis on face).   Yes Historical Provider, MD  hyoscyamine (LEVSIN SL) 0.125 MG SL tablet Place 0.125 mg under the tongue every 4 (four) hours as needed for cramping.   Yes Historical Provider, MD  ibuprofen (ADVIL,MOTRIN) 800 MG tablet Take 800 mg by mouth every 8 (eight) hours as needed for moderate pain.    Yes Historical Provider, MD  KRILL OIL PO Take 1 tablet by mouth daily.   Yes Historical Provider, MD  metoprolol tartrate (LOPRESSOR) 25 MG tablet Take 25 mg by mouth 2 (two) times daily.   Yes Historical Provider, MD  Multiple Vitamin (MULTIVITAMIN WITH MINERALS) TABS tablet Take 1 tablet by mouth daily.   Yes Historical Provider, MD  naproxen sodium (ANAPROX) 220 MG tablet Take 220 mg by mouth 2 (two) times daily as needed (pain).   Yes Historical Provider, MD  omeprazole (PRILOSEC) 40 MG capsule Take 40 mg by mouth 2 (two) times daily.   Yes Historical Provider, MD  phenazopyridine (PYRIDIUM) 200 MG tablet Take 1 tablet (200 mg total) by mouth 3 (three) times daily as needed for pain. 09/14/12  Yes Malka So, MD  polyethylene glycol [facility] / GLYCOLAX) packet Take 17 g by mouth every morning.   Yes Historical Provider, MD  POTASSIUM PO Take 1 tablet by mouth daily. OTC, one capsule at bedtime to prevent kidney stones   Yes Historical Provider, MD  Probiotic Product (PROBIOTIC PO) Take 1 capsule by mouth daily. Digestive Advantage   Yes Historical Provider, MD  traMADol (ULTRAM) 50 MG tablet Take 50-100 mg by mouth 3 (three) times daily as needed for pain.   Yes Historical Provider, MD  vitamin B-6  (PYRIDOXINE) 25 MG tablet Take 25 mg by mouth daily.   Yes Historical Provider, MD  Vitamin D, Cholecalciferol, 1000 UNITS TABS Take 1,000 mg by mouth daily.    Yes Historical Provider, MD  zolpidem (AMBIEN) 10 MG tablet Take 10 mg by mouth at bedtime as needed for sleep.   Yes Historical Provider, MD  ondansetron (ZOFRAN ODT) 4 MG disintegrating tablet Take 1 tablet (4 mg total) by mouth every 8 (eight) hours as needed for nausea or vomiting. 10/26/13   Garald Balding, NP  oxyCODONE-acetaminophen (PERCOCET/ROXICET) 5-325 MG per tablet Take 1-2 tablets by mouth every 6 (six) hours as needed for severe pain. 10/26/13   Garald Balding, NP  sulfamethoxazole-trimethoprim (SEPTRA DS) 800-160 MG per tablet Take 1 tablet by mouth 2 (two) times daily. 10/26/13   Garald Balding, NP   There  were no vitals taken for this visit. Physical Exam  Nursing note and vitals reviewed. Constitutional: She is oriented to person, place, and time. She appears well-developed and well-nourished.  HENT:  Head: Normocephalic.  Eyes: Pupils are equal, round, and reactive to light.  Cardiovascular: Normal rate and regular rhythm.   Pulmonary/Chest: Effort normal.  Abdominal: Soft. She exhibits no distension. There is tenderness.    Neurological: She is alert and oriented to person, place, and time.  Skin: Skin is warm. No rash noted.    ED Course  Procedures (including critical care time) Labs Review Labs Reviewed  URINALYSIS, ROUTINE W REFLEX MICROSCOPIC - Abnormal; Notable for the following:    Color, Urine RED (*)    APPearance CLOUDY (*)    Hgb urine dipstick SMALL (*)    Ketones, ur 15 (*)    Protein, ur 30 (*)    Urobilinogen, UA 2.0 (*)    Nitrite POSITIVE (*)    Leukocytes, UA MODERATE (*)    All other components within normal limits  URINE MICROSCOPIC-ADD ON - Abnormal; Notable for the following:    Squamous Epithelial / LPF FEW (*)    Bacteria, UA FEW (*)    All other components within normal limits    PREGNANCY, URINE  POC URINE PREG, ED    Imaging Review Ct Renal Stone Study  10/26/2013   CLINICAL DATA:  Left flank pain, history of kidney stones.  EXAM: CT RENAL STONE PROTOCOL  TECHNIQUE: Multidetector CT imaging of the abdomen and pelvis was performed following the standard protocol without intravenous contrast  COMPARISON:  CT of the abdomen and pelvis Jul 23, 2013  FINDINGS: LUNG BASES: Included view of the lung bases are clear. The visualized heart and pericardium are unremarkable.  KIDNEYS/BLADDER: Kidneys are orthotopic, demonstrating normal size and morphology. Moderate left hydroureteronephrosis to the level of the distal ureter were 2 calculi are seen measuring up to 3 mm. No residual left nephrolithiasis. Two 2 mm right interpolar nephrolithiasis. Left perinephric an ureteral stranding. Urinary bladder is partially distended unremarkable.  SOLID ORGANS: The spleen, pancreas and adrenal glands are unremarkable for this non-contrast examination. The liver is diffusely hypodense most consistent with fatty infiltration. Status post cholecystectomy.  GASTROINTESTINAL TRACT: The stomach, small bowel are normal in course and caliber without inflammatory changes, the sensitivity may be decreased by lack of enteric contrast. Surgical anastomosis at the level of the sigmoid colon. Colonic diverticulosis. Normal appendix.  PERITONEUM/RETROPERITONEUM: No intraperitoneal free fluid nor free air. Aortoiliac vessels are normal in course and caliber. No lymphadenopathy by CT size criteria. Internal reproductive organs are unremarkable.  SOFT TISSUES/ OSSEOUS STRUCTURES: 2.5 cm fluid collection within the subcutaneous fat immediately above the umbilicus with surrounding inflammation (sagittal 63/127). No subcutaneous gas or radiopaque foreign bodies. The grafts abdominal wall scarring. Subcentimeter presumed bone island left sacrum.  IMPRESSION: Moderate left hydroureteronephrosis to the level of the distal  ureter were to urolithiasis are seen measuring up to 3 mm. No residual left nephrolithiasis. Left perinephric stranding could reflect calyceal rupture is superimposed urinary tract infection.  Two nonobstructing right 2 mm interpolar nephrolithiasis.  2.5 cm fluid collection within the periumbilical subcutaneous fat, which could be infectious or posttraumatic, recommend direct inspection.  Fatty liver, status post cholecystectomy.   Electronically Signed   By: Elon Alas   On: 10/26/2013 01:16     EKG Interpretation None      MDM   Final diagnoses:  Kidney stone  UTI (lower urinary  tract infection)   Will give IV Rocephin in ED and Rx Septra, Zofran and Percocet for home use  Instructed patient to follow up with her urologist       Garald Balding, NP 10/26/13 [phone]

## 2013-10-26 NOTE — ED Provider Notes (Signed)
Medical screening examination/treatment/procedure(s) were performed by non-physician practitioner and as supervising physician I was immediately available for consultation/collaboration.   EKG Interpretation None       Kalman Drape, MD 10/26/13 [phone]

## 2013-10-26 NOTE — Discharge Instructions (Signed)
Call your urologist to set an appointment

## 2013-11-06 ENCOUNTER — Other Ambulatory Visit: Payer: Self-pay | Admitting: Urology

## 2013-11-06 NOTE — Progress Notes (Deleted)
[id number]

## 2013-11-06 NOTE — Progress Notes (Signed)
Need orders in EPIC.  Surgery on 11/10/2013.  Thank You.

## 2013-11-09 ENCOUNTER — Encounter (HOSPITAL_COMMUNITY): Payer: Self-pay | Admitting: Pharmacy Technician

## 2013-11-09 ENCOUNTER — Encounter (HOSPITAL_COMMUNITY): Payer: Self-pay | Admitting: *Deleted

## 2013-11-09 ENCOUNTER — Other Ambulatory Visit (HOSPITAL_COMMUNITY): Payer: Self-pay | Admitting: *Deleted

## 2013-11-09 NOTE — H&P (Signed)
Active Problems Problems  1. Abdominal pain, LLQ (left lower quadrant) (789.04) 2. Calculus of ureter (592.1) 3. 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 Flank Pain Bilateral 5. Generalized abdominal pain (789.07)   left flank pain 6. Working diagnosis of Hydronephrosis, bilateral (591) 7. Incomplete bladder emptying (788.21) 8. Left flank pain (789.09) 9. Microscopic hematuria (599.72) 10. Nephrolithiasis (592.0) 11. Urinary tract infection (599.0)  History of Present Illness [name] returns today with a left ureteral stone. She had the onset of severe pain on Sunday and went to the ER and had a CT that showed a 23m left proximal stone.  She had an exploratory lap for lysis of adhesions about 2 weeks ago.  Her symptoms from the stone have abated but she is still sore. She has nausea today. She had irritative symptoms but that has resolved.  She had been on antibiotics for some time prior to the abdominal surgery. She has had a low grade fever and it is 100.4 today.  She has been on pyridium and has nit+ urine but only a small amount of microhematuria.  She is on bactrim at this time.   Past Medical History Problems  1. History of Abdominal pain, suprapubic (789.09) 2. History of Anxiety (300.00) 3. History of Atrial Premature Complex (427.61) 4. History of Cyst of right ovary (620.2) 5. History of Diverticulosis of colon (562.10) 6. History of Herniated Disc (L5 - S1) Central (722.10) 7. History of depression (V11.8) 8. History of esophageal reflux (V12.79) 9. History of hypercholesterolemia (V12.29) 10. History of hypercholesterolemia (V12.29) 11. History of kidney stones (V13.01) 12. History of Nephrolithiasis Of The Left Kidney (V13.01) 13. History of Nephrolithiasis Of The Left Kidney (V13.01) 14. History of Pyuria (791.9) 15. History of Urinary Tract Infection (V13.02)  Surgical History Problems  1. History of Carotid Artery Catheterization 2. History of  Cholecystectomy 3. History of Cystoscopy With Insertion Of Ureteral Stent Left 4. History of Cystoscopy With Insertion Of Ureteral Stent Left 5. History of Cystoscopy With Insertion Of Ureteral Stent Left 6. History of Cystoscopy With Insertion Of Ureteral Stent Right 7. History of Cystoscopy With Removal Of Object 8. History of Cystoscopy With Ureteroscopy Left 9. History of Cystoscopy With Ureteroscopy With Lithotripsy 10. History of Cystoscopy With Ureteroscopy With Removal Of Calculus 11. History of Cystoscopy With Ureteroscopy With Removal Of Calculus 12. History of Exploratory Laparotomy 13. History of Knee Surgery 14. History of Knee Surgery 15. History of Partial Colectomy 16. History of Wrist Surgery  Current Meds 1. Amphetamine-Dextroamphet ER 20 MG Oral Capsule Extended Release 24 Hour;  Therapy: [phone] Recorded 2. Bactrim TABS;  Therapy: (Recorded:18Aug2015) to Recorded 3. EPINEPHrine 0.3 MG/0.3ML DEVI;  Therapy: 01Oct2010 to Recorded 4. Fish Oil CAPS;  Therapy: (Recorded:15Sep2008) to Recorded 5. Hyoscyamine Sulfate 0.125 MG Sublingual Tablet Sublingual;  Therapy: [id number] Recorded 6. Melatonin TABS;  Therapy: (Recorded:14May2015) to Recorded 7. Metoprolol Tartrate 25 MG Oral Tablet;  Therapy: 22Jan2014 to Recorded 8. Motrin 800 MG TABS;  Therapy: (Recorded:15Sep2008) to Recorded 9. Ondansetron 8 MG Oral Tablet Dispersible; DISSOLVE 1 TABLET BY MOUTH EVERY 6  HOURS AS NEEDED FOR NAUSEA;  Therapy: 26Aug2010 to (Evaluate:20May2015)  Requested for: [id number] Last  Rx:14May2015 Ordered 10. Oxycodone-Acetaminophen 5-325 MG Oral Tablet; TAKE 1 TO 2 TABLETS EVERY 4 TO 6   HOURS AS NEEDED FOR PAIN;   Therapy: [id number] (Evaluate:16May2015); Last Rx:14May2015 Ordered 11. Phenazopyridine HCl - 200 MG Oral Tablet;   Therapy: (Recorded:15Sep2008) to Recorded 12. PriLOSEC 40 MG Oral Capsule Delayed  Release;   Therapy: (Recorded:15Sep2008) to Recorded 13.  Tamsulosin HCl - 0.4 MG Oral Capsule;   Therapy: (Recorded:06Sep2012) to Recorded 14. TraMADol HCl TABS;   Therapy: (Recorded:15Sep2008) to Recorded 15. Tylenol TABS;   Therapy: (Recorded:14May2015) to Recorded 16. Valtrex 1 GM Oral Tablet;   Therapy: 01Oct2010 to Recorded 17. Vitamin D (Ergocalciferol) 50000 UNIT Oral Capsule;   Therapy: 27Jun2014 to Recorded 18. Westcort CREA;   Therapy: (Recorded:15Sep2008) to Recorded 19. Zolpidem Tartrate 10 MG Oral Tablet;   Therapy: [id number] to Recorded  Allergies Medication  1. Cipro TABS 2. Neoprene Knee Support Miscellaneous 3. Flagyl Non-Medication  4. Contrast Dye  Family History Problems  1. Family history of Arthritis (V17.7) : Maternal Grandmother 2. Family history of Arthritis (V17.7) : Mother 3. Family history of Depression : Mother 4. Family history of Depression : Sister 53. Family history of Family Health Status Number Of Children   One son and one daughter 42. Family history of Lupus Exedens : Mother 7. Family history of Renal Failure : Maternal Grandmother  Social History Problems  1. Alcohol Use   Once a month at the most. 2. Caffeine Use   1-2 per day 3. Marital History - Currently Married 4. Never A Smoker 5. Occupation:   Therapist, sports 6. Denied: Tobacco Use  Review of Systems  Genitourinary: no hematuria.  Gastrointestinal: nausea and flank pain.    Vitals Vital Signs [Data Includes: Last 1 Day]  Recorded: 18Aug2015 09:04AM  Blood Pressure: 115 / 82 Temperature: 100.4 F Heart Rate: 83  Physical Exam Constitutional: Well nourished and well developed . No acute distress.  Abdomen: moderate left CVA tenderness.    Results/Data   The following images/tracing/specimen were independently visualized:  CT reviewed and she has a 82m obstructing left mid ureteral stone. Selected Results  UA With REFLEX [id number]:44AM WIrine Seal SPECIMEN TYPE: CLEAN CATCH   Test Name Result Flag Reference  COLOR AMBER  A YELLOW  BIOCHEMICALS MAY BE AFFECTED BY THE COLOR OF THE URINE.  APPEARANCE CLOUDY A CLEAR  SPECIFIC GRAVITY 1.015  1.005-1.030  pH 5.5  5.0-8.0  GLUCOSE NEG mg/dL  NEG  BILIRUBIN NEG  NEG  KETONE NEG mg/dL  NEG  BLOOD TRACE A NEG  PROTEIN NEG mg/dL  NEG  UROBILINOGEN 0.2 mg/dL  0.0-1.0  NITRITE POS A NEG  LEUKOCYTE ESTERASE NEG  NEG  SQUAMOUS EPITHELIAL/HPF MODERATE A RARE  WBC 0-2 WBC/hpf  <3  RBC 3-6 RBC/hpf A <3  BACTERIA RARE  RARE  CRYSTALS NONE SEEN  NONE SEEN  CASTS NONE SEEN  NONE SEEN  Other MUCUS NOTED      27 Oct 2013 8:44 AM  UA With REFLEX    COLOR AMBER     APPEARANCE CLOUDY     SPECIFIC GRAVITY 1.015     pH 5.5     GLUCOSE NEG     BILIRUBIN NEG     KETONE NEG     BLOOD TRACE     PROTEIN NEG     UROBILINOGEN 0.2     NITRITE POS     LEUKOCYTE ESTERASE NEG     SQUAMOUS EPITHELIAL/HPF MODERATE     WBC 0-2     CRYSTALS NONE SEEN     CASTS NONE SEEN     RBC 3-6     BACTERIA RARE     Other MUCUS NOTED  Assessment Assessed  1. Calculus of ureter (592.1)  She has a 321mleft mid ureteral stone but her  symptoms have abated.   Plan Calculus of ureter  1. Follow-up MD/NP/PA Office  Follow-up  Status: Hold For - Date of Service  Requested for:  26Aug2015 2. KUB; Status:Hold For - Date of Service; Requested for:26Aug2015;  3. RENAL U/S LEFT; Status:Hold For - Date of Service; Requested for:26Aug2015;   I discussed the options and at this time she will continue with MET.  She was given a strainer and has adequate medication.  She will return in 1-2 weeks with a KUB and renal US.   On f/u last week her stone had moved to the UVJ and she wanted to see how she did over the weekend and proceed with ureteroscopy if the stone hadn't passed.

## 2013-11-09 NOTE — Anesthesia Preprocedure Evaluation (Addendum)
Anesthesia Evaluation  Patient identified by MRN, date of birth, ID band Patient awake    Reviewed: Allergy & Precautions, H&P , NPO status , Patient's Chart, lab work & pertinent test results  Airway Mallampati: III TM Distance: <3 FB Neck ROM: Full    Dental no notable dental hx.    Pulmonary neg pulmonary ROS,  breath sounds clear to auscultation  Pulmonary exam normal       Cardiovascular negative cardio ROS  Rhythm:Regular Rate:Normal     Neuro/Psych negative neurological ROS  negative psych ROS   GI/Hepatic Neg liver ROS, GERD-  Medicated,  Endo/Other  negative endocrine ROS  Renal/GU negative Renal ROS  negative genitourinary   Musculoskeletal negative musculoskeletal ROS (+)   Abdominal   Peds negative pediatric ROS (+)  Hematology negative hematology ROS (+)   Anesthesia Other Findings   Reproductive/Obstetrics negative OB ROS                          Anesthesia Physical Anesthesia Plan  ASA: II  Anesthesia Plan: General   Post-op Pain Management:    Induction: Intravenous  Airway Management Planned: LMA  Additional Equipment:   Intra-op Plan:   Post-operative Plan:   Informed Consent: I have reviewed the patients History and Physical, chart, labs and discussed the procedure including the risks, benefits and alternatives for the proposed anesthesia with the patient or authorized representative who has indicated his/her understanding and acceptance.   Dental advisory given  Plan Discussed with: CRNA and Surgeon  Anesthesia Plan Comments:         Anesthesia Quick Evaluation

## 2013-11-10 ENCOUNTER — Ambulatory Visit (HOSPITAL_COMMUNITY): Payer: 59 | Admitting: Anesthesiology

## 2013-11-10 ENCOUNTER — Encounter (HOSPITAL_COMMUNITY)
Admission: RE | Disposition: A | Discharge status: Home / Self Care | Payer: Self-pay | Source: Ambulatory Visit | Attending: Urology

## 2013-11-10 ENCOUNTER — Ambulatory Visit (HOSPITAL_COMMUNITY): Payer: 59

## 2013-11-10 ENCOUNTER — Ambulatory Visit (HOSPITAL_COMMUNITY)
Admission: RE | Admit: 2013-11-10 | Discharge: 2013-11-10 | Disposition: A | Discharge status: Home / Self Care | Payer: 59 | Source: Ambulatory Visit | Attending: Urology | Admitting: Urology

## 2013-11-10 ENCOUNTER — Encounter (HOSPITAL_COMMUNITY): Payer: Self-pay

## 2013-11-10 ENCOUNTER — Encounter (HOSPITAL_COMMUNITY): Payer: 59 | Admitting: Anesthesiology

## 2013-11-10 DIAGNOSIS — N390 Urinary tract infection, site not specified: Secondary | ICD-10-CM | POA: Insufficient documentation

## 2013-11-10 DIAGNOSIS — N201 Calculus of ureter: Secondary | ICD-10-CM | POA: Insufficient documentation

## 2013-11-10 DIAGNOSIS — N1330 Unspecified hydronephrosis: Secondary | ICD-10-CM | POA: Diagnosis not present

## 2013-11-10 DIAGNOSIS — R1084 Generalized abdominal pain: Secondary | ICD-10-CM | POA: Diagnosis not present

## 2013-11-10 DIAGNOSIS — Z888 Allergy status to other drugs, medicaments and biological substances status: Secondary | ICD-10-CM | POA: Diagnosis not present

## 2013-11-10 DIAGNOSIS — Z79899 Other long term (current) drug therapy: Secondary | ICD-10-CM | POA: Insufficient documentation

## 2013-11-10 DIAGNOSIS — Z881 Allergy status to other antibiotic agents status: Secondary | ICD-10-CM | POA: Diagnosis not present

## 2013-11-10 DIAGNOSIS — Z91041 Radiographic dye allergy status: Secondary | ICD-10-CM | POA: Insufficient documentation

## 2013-11-10 DIAGNOSIS — R3129 Other microscopic hematuria: Secondary | ICD-10-CM | POA: Diagnosis not present

## 2013-11-10 DIAGNOSIS — K219 Gastro-esophageal reflux disease without esophagitis: Secondary | ICD-10-CM | POA: Insufficient documentation

## 2013-11-10 DIAGNOSIS — R1032 Left lower quadrant pain: Secondary | ICD-10-CM | POA: Diagnosis not present

## 2013-11-10 DIAGNOSIS — Q6101 Congenital single renal cyst: Secondary | ICD-10-CM | POA: Insufficient documentation

## 2013-11-10 DIAGNOSIS — E7800 Pure hypercholesterolemia, unspecified: Secondary | ICD-10-CM | POA: Diagnosis not present

## 2013-11-10 DIAGNOSIS — F411 Generalized anxiety disorder: Secondary | ICD-10-CM | POA: Diagnosis not present

## 2013-11-10 DIAGNOSIS — I491 Atrial premature depolarization: Secondary | ICD-10-CM | POA: Diagnosis not present

## 2013-11-10 DIAGNOSIS — R339 Retention of urine, unspecified: Secondary | ICD-10-CM | POA: Diagnosis not present

## 2013-11-10 HISTORY — PX: CYSTOSCOPY/RETROGRADE/URETEROSCOPY/STONE EXTRACTION WITH BASKET: SHX5317

## 2013-11-10 HISTORY — DX: Psoriasis, unspecified: L40.9

## 2013-11-10 HISTORY — DX: Sleep disorder, unspecified: G47.9

## 2013-11-10 HISTORY — DX: Urinary tract infection, site not specified: N39.0

## 2013-11-10 LAB — SURGICAL PCR SCREEN
MRSA, PCR: NEGATIVE
STAPHYLOCOCCUS AUREUS: NEGATIVE

## 2013-11-10 LAB — CBC
HEMATOCRIT: 38 % (ref 36.0–46.0)
Hemoglobin: 12.7 g/dL (ref 12.0–15.0)
MCH: 30.8 pg (ref 26.0–34.0)
MCHC: 33.4 g/dL (ref 30.0–36.0)
MCV: 92.2 fL (ref 78.0–100.0)
PLATELETS: 183 10*3/uL (ref 150–400)
RBC: 4.12 MIL/uL (ref 3.87–5.11)
RDW: 12.8 % (ref 11.5–15.5)
WBC: 7.4 10*3/uL (ref 4.0–10.5)

## 2013-11-10 LAB — BASIC METABOLIC PANEL
ANION GAP: 11 (ref 5–15)
BUN: 13 mg/dL (ref 6–23)
CHLORIDE: 99 meq/L (ref 96–112)
CO2: 27 mEq/L (ref 19–32)
CREATININE: 0.85 mg/dL (ref 0.50–1.10)
Calcium: 9.3 mg/dL (ref 8.4–10.5)
GFR calc non Af Amer: 80 mL/min — ABNORMAL LOW (ref >90)
Glucose, Bld: 94 mg/dL (ref 70–99)
Potassium: 3.9 mEq/L (ref 3.7–5.3)
SODIUM: 137 meq/L (ref 137–147)

## 2013-11-10 LAB — HCG, SERUM, QUALITATIVE: Preg, Serum: NEGATIVE

## 2013-11-10 SURGERY — CYSTOSCOPY, WITH CALCULUS REMOVAL USING BASKET
Anesthesia: General | Laterality: Left

## 2013-11-10 MED ORDER — FENTANYL CITRATE 0.05 MG/ML IJ SOLN: 25.0000 ug | INTRAMUSCULAR | Status: DC | PRN

## 2013-11-10 MED ORDER — SCOPOLAMINE 1 MG/3DAYS TD PT72
MEDICATED_PATCH | TRANSDERMAL | Status: AC
  Filled 2013-11-10: qty 1

## 2013-11-10 MED ORDER — BELLADONNA ALKALOIDS-OPIUM 16.2-60 MG RE SUPP
RECTAL | Status: AC
  Filled 2013-11-10: qty 1

## 2013-11-10 MED ORDER — ONDANSETRON HCL 4 MG/2ML IJ SOLN
INTRAMUSCULAR | Status: AC
Start: 2013-11-10 — End: 2013-11-10
  Filled 2013-11-10: qty 2

## 2013-11-10 MED ORDER — MIDAZOLAM HCL 5 MG/5ML IJ SOLN
INTRAMUSCULAR | Status: DC | PRN
  Administered 2013-11-10: 2 mg via INTRAVENOUS

## 2013-11-10 MED ORDER — ONDANSETRON HCL 4 MG/2ML IJ SOLN
INTRAMUSCULAR | Status: DC | PRN
  Administered 2013-11-10: 4 mg via INTRAVENOUS

## 2013-11-10 MED ORDER — PROPOFOL 10 MG/ML IV BOLUS
INTRAVENOUS | Status: AC
  Filled 2013-11-10: qty 20

## 2013-11-10 MED ORDER — KETOROLAC TROMETHAMINE 30 MG/ML IJ SOLN: 15.0000 mg | Freq: Once | INTRAMUSCULAR | Status: DC | PRN

## 2013-11-10 MED ORDER — CEFAZOLIN SODIUM-DEXTROSE 2-3 GM-% IV SOLR
INTRAVENOUS | Status: AC
  Filled 2013-11-10: qty 50

## 2013-11-10 MED ORDER — LIDOCAINE HCL (CARDIAC) 20 MG/ML IV SOLN
INTRAVENOUS | Status: AC
  Filled 2013-11-10: qty 5

## 2013-11-10 MED ORDER — FENTANYL CITRATE 0.05 MG/ML IJ SOLN
INTRAMUSCULAR | Status: DC | PRN
  Administered 2013-11-10 (×2): 50 ug via INTRAVENOUS

## 2013-11-10 MED ORDER — FENTANYL CITRATE 0.05 MG/ML IJ SOLN
INTRAMUSCULAR | Status: AC
  Filled 2013-11-10: qty 2

## 2013-11-10 MED ORDER — SCOPOLAMINE 1 MG/3DAYS TD PT72
MEDICATED_PATCH | TRANSDERMAL | Status: DC | PRN
  Administered 2013-11-10: 1 via TRANSDERMAL

## 2013-11-10 MED ORDER — SODIUM CHLORIDE 0.9 % IR SOLN
Status: DC | PRN
  Administered 2013-11-10: 1000 mL

## 2013-11-10 MED ORDER — PROPOFOL 10 MG/ML IV BOLUS
INTRAVENOUS | Status: DC | PRN
  Administered 2013-11-10: 20 mg via INTRAVENOUS
  Administered 2013-11-10: 180 mg via INTRAVENOUS

## 2013-11-10 MED ORDER — MUPIROCIN 2 % EX OINT
1.0000 "application " | TOPICAL_OINTMENT | Freq: Once | CUTANEOUS | Status: DC
  Filled 2013-11-10: qty 22

## 2013-11-10 MED ORDER — LACTATED RINGERS IV SOLN
INTRAVENOUS | Status: DC | PRN
  Administered 2013-11-10: 07:00:00 via INTRAVENOUS

## 2013-11-10 MED ORDER — MIDAZOLAM HCL 2 MG/2ML IJ SOLN
INTRAMUSCULAR | Status: AC
  Filled 2013-11-10: qty 2

## 2013-11-10 MED ORDER — LACTATED RINGERS IV SOLN
INTRAVENOUS | Status: DC
  Administered 2013-11-10: 09:00:00 via INTRAVENOUS

## 2013-11-10 MED ORDER — BELLADONNA ALKALOIDS-OPIUM 16.2-60 MG RE SUPP
RECTAL | Status: DC | PRN
  Administered 2013-11-10: 1 via RECTAL

## 2013-11-10 MED ORDER — PROMETHAZINE HCL 25 MG/ML IJ SOLN: 6.2500 mg | INTRAMUSCULAR | Status: DC | PRN

## 2013-11-10 MED ORDER — LIDOCAINE HCL 2 % EX GEL
CUTANEOUS | Status: AC
  Filled 2013-11-10: qty 10

## 2013-11-10 MED ORDER — DEXAMETHASONE SODIUM PHOSPHATE 10 MG/ML IJ SOLN
INTRAMUSCULAR | Status: AC
  Filled 2013-11-10: qty 1

## 2013-11-10 MED ORDER — DEXAMETHASONE SODIUM PHOSPHATE 10 MG/ML IJ SOLN
INTRAMUSCULAR | Status: DC | PRN
  Administered 2013-11-10: 10 mg via INTRAVENOUS

## 2013-11-10 MED ORDER — CEFAZOLIN SODIUM-DEXTROSE 2-3 GM-% IV SOLR
2.0000 g | Freq: Once | INTRAVENOUS | Status: AC
  Administered 2013-11-10: 2 g via INTRAVENOUS

## 2013-11-10 SURGICAL SUPPLY — 22 items
BAG URO CATCHER STRL LF (DRAPE) ×3 IMPLANT
BASKET LASER NITINOL 1.9FR (BASKET) IMPLANT
BASKET STONE NCOMPASS (UROLOGICAL SUPPLIES) IMPLANT
BSKT STON RTRVL 120 1.9FR (BASKET)
CATH URET 5FR 28IN OPEN ENDED (CATHETERS) ×2 IMPLANT
CLOTH BEACON ORANGE TIMEOUT ST (SAFETY) ×3 IMPLANT
DRAPE CAMERA CLOSED 9X96 (DRAPES) ×3 IMPLANT
EXTRACTOR STONE NITINOL NGAGE (UROLOGICAL SUPPLIES) IMPLANT
FIBER LASER FLEXIVA 200 (UROLOGICAL SUPPLIES) ×1 IMPLANT
FIBER LASER TRAC TIP (UROLOGICAL SUPPLIES) ×1 IMPLANT
GLOVE SURG SS PI 8.0 STRL IVOR (GLOVE) ×2 IMPLANT
GOWN STRL REUS W/TWL XL LVL3 (GOWN DISPOSABLE) ×3 IMPLANT
GUIDEWIRE STR DUAL SENSOR (WIRE) ×3 IMPLANT
IV NS 1000ML (IV SOLUTION) ×3
IV NS 1000ML BAXH (IV SOLUTION) ×1 IMPLANT
IV NS IRRIG 3000ML ARTHROMATIC (IV SOLUTION) ×3 IMPLANT
MANIFOLD NEPTUNE II (INSTRUMENTS) ×3 IMPLANT
PACK CYSTO (CUSTOM PROCEDURE TRAY) ×3 IMPLANT
SHEATH ACCESS URETERAL 38CM (SHEATH) ×1 IMPLANT
SHEATH URET ACCESS 10/12FR (MISCELLANEOUS) IMPLANT
TUBING CONNECTING 10 (TUBING) ×2 IMPLANT
TUBING CONNECTING 10' (TUBING) ×1

## 2013-11-10 NOTE — Anesthesia Postprocedure Evaluation (Signed)
  Anesthesia Post-op Note  Patient: [name]  Procedure(s) Performed: Procedure(s) (LRB): LEFT URETEROSCOPY, (Left)  Patient Location: PACU  Anesthesia Type: General  Level of Consciousness: awake and alert   Airway and Oxygen Therapy: Patient Spontanous Breathing  Post-op Pain: mild  Post-op Assessment: Post-op Vital signs reviewed, Patient's Cardiovascular Status Stable, Respiratory Function Stable, Patent Airway and No signs of Nausea or vomiting  Last Vitals:  Filed Vitals:   11/10/13 0830  BP: 117/66  Pulse: 71  Temp:   Resp: 17    Post-op Vital Signs: stable   Complications: No apparent anesthesia complications

## 2013-11-10 NOTE — Transfer of Care (Signed)
Immediate Anesthesia Transfer of Care Note  Patient: [name]  Procedure(s) Performed: Procedure(s) (LRB): LEFT URETEROSCOPY, (Left)  Patient Location: PACU  Anesthesia Type: General  Level of Consciousness: sedated, patient cooperative and responds to stimulation  Airway & Oxygen Therapy: Patient Spontanous Breathing and Patient connected to face mask oxgen  Post-op Assessment: Report given to PACU RN and Post -op Vital signs reviewed and stable  Post vital signs: Reviewed and stable  Complications: No apparent anesthesia complications

## 2013-11-10 NOTE — Op Note (Signed)
[name], [name] NO.:  [id number]  MEDICAL RECORD NO.:  [id number]  LOCATION:  [facility]                         FACILITY:  [facility]  PHYSICIAN:  Marshall Cork. Jeffie Pollock, M.D.    DATE OF BIRTH:  Feb 06, 1966  DATE OF PROCEDURE:  11/10/2013 DATE OF DISCHARGE:                              OPERATIVE REPORT   PROCEDURE:  Cystoscopy with left ureteroscopy.  PREOPERATIVE DIAGNOSIS:  Left distal ureteral stone.  POSTOPERATIVE DIAGNOSIS:  Interval passage of left distal ureteral stone.  SURGEON:  Marshall Cork. Jeffie Pollock, M.D.  ANESTHESIA:  General.  SPECIMENS:  None.  DRAINS:  None.  COMPLICATIONS:  None.  INDICATIONS:  [name] is a 48 year old white female, who has recurrent urolithiasis, who has recently been found to have a 4 mm left ureteral stone, it had progressed to the UVJ and she elected to be scheduled for ureteroscopy because of persistent symptoms.  On KUB this morning, stone was not readily apparent, but previously on KUB it had been very difficult to see.  I did look at her prior CT scan as well.  She had no renal stones on the right or left, but just the distal stone.  On exam in the holding area, she continued to have left lower quadrant pain and she continued to have irritative voiding symptoms, and we discussed the possibility that the stone would no longer be present, but because of her persistent pain, elected to go ahead with the procedure since stone sometimes are not visible on plain film.  She was taken to the operating room where she was given 2 g of Ancef. The general anesthetic was induced.  She was placed in lithotomy position and fitted with PAS hose.  Her perineum and genitalia were prepped with Betadine solution.  She was draped in the usual sterile fashion.  Cystoscopy performed using a 22-French scope and 12-degree lens. Examination revealed a normal urethra.  The bladder wall was smooth and pale without tumor, stones, or inflammation.  Ureteral  orifices were unremarkable.  There was no erythema, edema, or other abnormalities in the left ureteral orifice to suggest recent stone passage, although it was relatively wide mouthed.  At this point, the 6.5-French short ureteroscope was passed and it easily entered the left ureteral orifice.  I was able to advance the scope all the way to the kidney without difficulty and no stones were identified.  There was no significant erythema or ureteral irritation to suggest recent stone passage and upon removal of the scope, there was no mucosal injury from the instrumentation and was not felt stent was indicated.  At this point, the bladder was drained.  The patient was taken down from lithotomy position.  Her anesthetic was reversed.  She was moved to the recovery room in a stable condition.  There were no complications.     Marshall Cork. Jeffie Pollock, M.D.     JJW/MEDQ  D:  11/10/2013  T:  11/10/2013  Job:  [id number]

## 2013-11-10 NOTE — Discharge Instructions (Signed)
Cystoscopy, Care After   Refer to this sheet in the next few weeks. These instructions provide you with information on caring for yourself after your procedure. Your caregiver may also give you more specific instructions. Your treatment has been planned according to current medical practices, but problems sometimes occur. Call your caregiver if you have any problems or questions after your procedure.   HOME CARE INSTRUCTIONS   Things you can do to ease any discomfort after your procedure include:   Drinking enough water and fluids to keep your urine clear or pale yellow.   Taking a warm bath to relieve any burning feelings.  SEEK IMMEDIATE MEDICAL CARE IF:   You have an increase in blood in your urine.   You notice blood clots in your urine.   You have difficulty passing urine.   You have the chills.   You have abdominal pain.   You have a fever or persistent symptoms for more than 2-3 days.   You have a fever and your symptoms suddenly get worse.  MAKE SURE YOU:   Understand these instructions.   Will watch your condition.   Will get help right away if you are not doing well or get worse.  Document Released: 09/15/2004 Document Revised: 10/29/2012 Document Reviewed: 08/20/2011   ExitCare® Patient Information ©2015 [employer]. This information is not intended to replace advice given to you by your health care provider. Make sure you discuss any questions you have with your health care provider.

## 2013-11-10 NOTE — Interval H&P Note (Signed)
History and Physical Interval Note:  11/10/2013 7:28 AM  [name]  has presented today for surgery, with the diagnosis of LEFT UVJ STONE  The various methods of treatment have been discussed with the patient and family. After consideration of risks, benefits and other options for treatment, the patient has consented to  Procedure(s): LEFT URETEROSCOPY, STONE EXTRACTION  (Left) as a surgical intervention .  The patient's history has been reviewed, patient examined, no change in status, stable for surgery.  I have reviewed the patient's chart and labs.  Questions were answered to the patient's satisfaction.     Ceaser Ebeling J

## 2013-11-10 NOTE — Brief Op Note (Signed)
11/10/2013  8:03 AM  PATIENT:  [name]  48 y.o. female  PRE-OPERATIVE DIAGNOSIS:  LEFT UVJ STONE  POST-OPERATIVE DIAGNOSIS:  left uvj stone passed  PROCEDURE:  Procedure(s): LEFT URETEROSCOPY (Left)  SURGEON:  Surgeon(s) and Role:    * Malka So, MD - Primary  PHYSICIAN ASSISTANT:   ASSISTANTS: none   ANESTHESIA:   general  EBL:     BLOOD ADMINISTERED:none  DRAINS: none   LOCAL MEDICATIONS USED:  NONE  SPECIMEN:  No Specimen  DISPOSITION OF SPECIMEN:  N/A  COUNTS:  YES  TOURNIQUET:  * No tourniquets in log *  DICTATION: .Other Dictation: Dictation Number [id number]  PLAN OF CARE: Discharge to home after PACU  PATIENT DISPOSITION:  PACU - hemodynamically stable.   Delay start of Pharmacological VTE agent (>24hrs) due to surgical blood loss or risk of bleeding: not applicable

## 2013-11-10 NOTE — Progress Notes (Signed)
Pt c/o yeast infection due to prolonged antibiotic regimen prior to procedure.  States she forgot to ask Dr. Jeffie Pollock for a prescription, requesting RN call to ask for one.  [facility], spoke with Dr. Jeffie Pollock.  Prescription sent electronically to pt's pharmacy.  Coolidge Breeze, RN 11/10/2013

## 2013-11-11 ENCOUNTER — Encounter (HOSPITAL_COMMUNITY): Payer: Self-pay | Admitting: Urology

## 2013-11-24 ENCOUNTER — Ambulatory Visit: Payer: 59 | Admitting: Neurology

## 2013-11-25 ENCOUNTER — Telehealth: Payer: Self-pay | Admitting: Neurology

## 2013-11-25 NOTE — Telephone Encounter (Signed)
Pt no showed 11/24/13 appt w/ Dr. Posey Pronto. No show letter mailed to pt / Sherri S.

## 2013-12-14 ENCOUNTER — Other Ambulatory Visit: Payer: Self-pay | Admitting: Internal Medicine

## 2013-12-14 ENCOUNTER — Encounter: Payer: Self-pay | Admitting: *Deleted

## 2013-12-16 ENCOUNTER — Encounter: Payer: Self-pay | Admitting: Internal Medicine

## 2013-12-16 ENCOUNTER — Ambulatory Visit (INDEPENDENT_AMBULATORY_CARE_PROVIDER_SITE_OTHER): Payer: 59 | Admitting: Internal Medicine

## 2013-12-16 VITALS — BP 140/90 | HR 88 | Ht 65.0 in | Wt 219.0 lb

## 2013-12-16 DIAGNOSIS — K660 Peritoneal adhesions (postprocedural) (postinfection): Secondary | ICD-10-CM

## 2013-12-16 DIAGNOSIS — K219 Gastro-esophageal reflux disease without esophagitis: Secondary | ICD-10-CM

## 2013-12-16 DIAGNOSIS — R109 Unspecified abdominal pain: Secondary | ICD-10-CM

## 2013-12-16 DIAGNOSIS — K5730 Diverticulosis of large intestine without perforation or abscess without bleeding: Secondary | ICD-10-CM

## 2013-12-16 DIAGNOSIS — R110 Nausea: Secondary | ICD-10-CM

## 2013-12-16 MED ORDER — TRAMADOL HCL 50 MG PO TABS: 50.0000 mg | ORAL_TABLET | Freq: Three times a day (TID) | ORAL | Status: DC | PRN

## 2013-12-16 MED ORDER — ONDANSETRON HCL 8 MG PO TABS: 8.0000 mg | ORAL_TABLET | Freq: Two times a day (BID) | ORAL | Status: DC | PRN

## 2013-12-16 MED ORDER — PROMETHAZINE HCL 25 MG PO TABS: 25.0000 mg | ORAL_TABLET | Freq: Four times a day (QID) | ORAL | Status: DC | PRN

## 2013-12-16 MED ORDER — MESALAMINE 1.2 G PO TBEC: 2.4000 g | DELAYED_RELEASE_TABLET | Freq: Every day | ORAL | Status: DC

## 2013-12-16 NOTE — Progress Notes (Signed)
Subjective:    Patient ID: [name], female    DOB: 09/28/1965, 48 y.o.   MRN: [id number]  HPI [name] is a 48 yo female with PMH of diverticulosis with diverticulitis status post sigmoid resection in December 2012, recurrent diverticulitis after resection requiring hospitalization complicated by C. difficile, history of bilateral renal stones, chronic lower back pain, GERD, ADHD, abdominal adhesive disease, and chronic left-sided abdominal pain who is seen for followup. Since being seen here she had lysis of adhesions performed by Dr. Lovie Macadamia in [place], [place] on 10/09/2013. She reports prior to this she was continuing to have constipation and severe left-sided abdominal pain. Reportedly he found the abdominal wall adhesive disease involving bowel and ovary/fallopian tube. She reports after surgery her symptoms improved dramatically and she no longer has constipation. She is still having left middle abdominal pain but overall this is better. She is treating this with ibuprofen and tramadol. She is off her antidepressants. She is also off laxatives though she will use MiraLax maybe once per month if her stools become too firm. She reports she is eating well, no weight loss or hepatobiliary complaint. No dysphagia or odynophagia. She is taking omeprazole twice daily for GERD. She is to requiring Zofran and occasionally Phenergan for nausea. No vomiting.  She reports she has done quite a bit of research regarding her daily dose improved left middle abdominal pain. She wonders if she could have inflammation associated with diverticulosis or even low-grade diverticulitis. She brings in studies performed in GI journals suggesting benefit from mesalamine and rifaximin. She does report low-grade fevers but no night sweats or chills. She has been able to work.   records reviewed her prior procedures including upper endoscopy and colonoscopy performed on 03/18/2013. Colonoscopy revealed 2 sessile  polyps found to be adenomatous. Mild diverticulosis in the descending colon and a healthy appearing sigmoid anastomosis. Upper endoscopy revealed a variable Z line, 2 cm hiatal hernia and reactive gastropathy. Biopsy showed moderate chronic active gastritis without H. pylori. There is no evidence of Barrett's.  Review of Systems As per history of present illness, otherwise negative  Current Medications, Allergies, Past Medical History, Past Surgical History, Family History and Social History were reviewed in [employer] record.     Objective:   Physical Exam BP 140/90  Pulse 88  Ht 5\' 5"  (1.651 m)  Wt 219 lb (99.338 kg)  BMI 36.44 kg/m2  SpO2 98% Constitutional: Well-developed and well-nourished. No distress. HEENT: Normocephalic and atraumatic. Oropharynx is clear and moist. No oropharyngeal exudate. Conjunctivae are normal.  No scleral icterus. Neck: Neck supple. Trachea midline. Cardiovascular: Normal rate, regular rhythm and intact distal pulses. No M/R/G Pulmonary/chest: Effort normal and breath sounds normal. No wheezing, rales or rhonchi. Abdominal: Soft, mild left upper/middle quadrant abdominal pain without rebound or guarding, nondistended. Bowel sounds active throughout. There are no masses palpable. Multiple well-healed abdominal scars Extremities: no clubbing, cyanosis, or edema Lymphadenopathy: No cervical adenopathy noted. Neurological: Alert and oriented to person place and time. Psychiatric: Normal mood and affect. Behavior is normal.  CBC    Component Value Date/Time   WBC 7.4 11/10/2013 0625   RBC 4.12 11/10/2013 0625   HGB 12.7 11/10/2013 0625   HCT 38.0 11/10/2013 0625   PLT 183 11/10/2013 0625   MCV 92.2 11/10/2013 0625   MCH 30.8 11/10/2013 0625   MCHC 33.4 11/10/2013 0625   RDW 12.8 11/10/2013 0625   LYMPHSABS 2.1 09/25/2012 1726   MONOABS 0.5 09/25/2012  1726   EOSABS 0.1 09/25/2012 1726   BASOSABS 0.0 09/25/2012 1726    CMP     Component Value  Date/Time   NA 137 11/10/2013 0625   K 3.9 11/10/2013 0625   CL 99 11/10/2013 0625   CO2 27 11/10/2013 0625   GLUCOSE 94 11/10/2013 0625   BUN 13 11/10/2013 0625   CREATININE 0.85 11/10/2013 0625   CALCIUM 9.3 11/10/2013 0625   PROT 7.6 09/25/2012 1726   ALBUMIN 3.9 09/25/2012 1726   AST 15 09/25/2012 1726   ALT 26 09/25/2012 1726   ALKPHOS 81 09/25/2012 1726   BILITOT 0.2* 09/25/2012 1726   GFRNONAA 80* 11/10/2013 0625   GFRAA >90 11/10/2013 0625   CT scan of the abdomen and pelvis reviewed, performed at [facility] on 07/23/2013 -- small nonobstructing kidney stones, sigmoid resection, descending colon diverticulosis without diverticulitis, normal appendix    Assessment & Plan:  48 yo female with PMH of diverticulosis with diverticulitis status post sigmoid resection in December 2012, recurrent diverticulitis after resection requiring hospitalization complicated by C. difficile, history of bilateral renal stones, chronic lower back pain, GERD, ADHD, abdominal adhesive disease, and chronic left-sided abdominal pain who is seen for followup.  1. Chronic left-sided abdominal pain/adhesive disease s/p LOA  -- her left-sided abdominal pain improved with lysis of adhesions indicating that at least in part adhesions were contributing to her symptoms. Her constipation improved as a result of lysis of adhesions. She is still having pain in the left mid and upper abdomen which is near the segment of her diverticulosis. Suspicion for smoldering diverticulitis is overall low. There is no evidence of segmental colitis associated with diverticulosis at time of colonoscopy, though I cannot exclude that for sure now. I reviewed the medical literature with her and agree that a trial of mesalamine is low risk and reasonable. I will begin Lialda 2.4 g daily. I would like her to take this for one month before determining effectiveness. She is asked to let me know after 1 month how she is feeling. If there's been no benefit we can  try rifaximin 550 mg 3 times daily for 14 days. For now she can continue tramadol, though overall I would like her to limit the use of this medication and I do not want her to use it long-term. She can continue Zofran as needed Phenergan for nausea. I will see her back in 3 months, sooner if necessary  2. Constipation -- no longer an issue after lysis of adhesions  3. GERD -- symptoms well controlled on omeprazole 40 mg twice daily. Continue this dose for now. No evidence for Barrett's esophagus, and no new alarm symptoms.

## 2013-12-16 NOTE — Patient Instructions (Addendum)
We have sent the following prescriptions to your mail in pharmacy: Lialda 2.4 grams daily Zofran Tramadol Phenergan  If you have not heard from your mail in pharmacy within 1 week or if you have not received your medication in the mail, please contact us at [phone] so we may find out why.   Please call our office in 1 month with an update on how [name] is working for you. Ask for [name] at [phone].  Please follow up with Dr [name] on 3 months.

## 2013-12-25 ENCOUNTER — Other Ambulatory Visit: Payer: Self-pay

## 2014-01-11 ENCOUNTER — Encounter: Payer: Self-pay | Admitting: Internal Medicine

## 2014-02-08 ENCOUNTER — Encounter: Payer: Self-pay | Admitting: Internal Medicine

## 2014-02-09 ENCOUNTER — Other Ambulatory Visit: Payer: Self-pay

## 2014-02-09 MED ORDER — RIFAXIMIN 550 MG PO TABS: 550.0000 mg | ORAL_TABLET | Freq: Three times a day (TID) | ORAL | Status: DC

## 2014-02-09 NOTE — Telephone Encounter (Signed)
Rifaximin 550 mg three times daily x 10 days

## 2014-02-10 ENCOUNTER — Encounter: Payer: Self-pay | Admitting: *Deleted

## 2014-02-10 NOTE — Telephone Encounter (Signed)
Dr Hilarie Fredrickson- Rx for xifaxan has been sent to [facility] so they can get prior auth from insurance company.  Patient states that she just finished her xifaxan samples yesterday. She also indicates that the mesalamine she has been taking has helped her "chronic abdominal pain." However, for the last 2-3 days, she has been having sharp pain in the mid abdomen. She had a low grade fever last night but believes that may be due to a cold. Any suggestions for her in the interm while xifaxan is being approved?

## 2014-02-11 NOTE — Telephone Encounter (Signed)
Disregard previous 2 notes... [facility] was able to get prior auth for patient's medication and it will cost nothing to her. They will ship today.

## 2014-02-11 NOTE — Telephone Encounter (Signed)
Dr Archer Asa see note below..... Patient calls back today stating that you told her to look into getting rifaxamin from [place]. She says she looked at [facility] and found it for$179.00 per 100 tablets. She would need an rx for that. Please advise.Marland KitchenMarland Kitchen

## 2014-04-09 ENCOUNTER — Encounter: Payer: Self-pay | Admitting: *Deleted

## 2014-04-21 ENCOUNTER — Encounter: Payer: Self-pay | Admitting: Internal Medicine

## 2014-04-21 ENCOUNTER — Ambulatory Visit (INDEPENDENT_AMBULATORY_CARE_PROVIDER_SITE_OTHER): Payer: PRIVATE HEALTH INSURANCE | Admitting: Internal Medicine

## 2014-04-21 VITALS — BP 104/80 | HR 112 | Ht 64.5 in | Wt 215.2 lb

## 2014-04-21 DIAGNOSIS — R945 Abnormal results of liver function studies: Secondary | ICD-10-CM

## 2014-04-21 DIAGNOSIS — R7989 Other specified abnormal findings of blood chemistry: Secondary | ICD-10-CM

## 2014-04-21 DIAGNOSIS — K219 Gastro-esophageal reflux disease without esophagitis: Secondary | ICD-10-CM

## 2014-04-21 DIAGNOSIS — K760 Fatty (change of) liver, not elsewhere classified: Secondary | ICD-10-CM

## 2014-04-21 DIAGNOSIS — K5010 Crohn's disease of large intestine without complications: Secondary | ICD-10-CM

## 2014-04-21 DIAGNOSIS — K589 Irritable bowel syndrome without diarrhea: Secondary | ICD-10-CM

## 2014-04-21 MED ORDER — RIFAXIMIN 550 MG PO TABS: 550.0000 mg | ORAL_TABLET | Freq: Three times a day (TID) | ORAL | Status: DC

## 2014-04-21 MED ORDER — VITAMIN E 400 UNITS PO TABS: 2.0000 | ORAL_TABLET | Freq: Every day | ORAL | Status: DC

## 2014-04-21 MED ORDER — MESALAMINE 1.2 G PO TBEC: 2.4000 g | DELAYED_RELEASE_TABLET | Freq: Every day | ORAL | Status: DC

## 2014-04-21 MED ORDER — SACCHAROMYCES BOULARDII 250 MG PO CAPS: 250.0000 mg | ORAL_CAPSULE | Freq: Two times a day (BID) | ORAL | Status: DC

## 2014-04-21 NOTE — Progress Notes (Signed)
Subjective:    Patient ID: [name], female    DOB: January 12, 1966, 49 y.o.   MRN: [id number]  HPI [name] is a 49 year old female with past medical history of diverticulosis status post sigmoid resection for diverticulitis, history of C. 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 She was last seen on 12/16/2013. After this visit she was started on Lialda 2 tablets daily for segment of colitis. She reports that this helped significantly with her left-sided abdominal pain. She also took a course of rifaximin for segmental colitis. She reports this helped tremendously.  Unfortunately she is getting over a viral gastroenteritis which affected multiple people at her place of employment. This was associated with nausea, diarrhea, fever and chills. The symptoms have resolved but now she has had some subsequent constipation in the days after her symptoms improved. She is taking several doses of MiraLAX to try to help with constipation. No blood in her stool or melena. Left-sided abdominal pain has returned somewhat after being off Lialda for a few weeks because the prescription expired. She would like to restart this medication and also try rifaximin again   She is using omeprazole 40 mg twice daily. She's tried taking this once daily but has considerable nocturnal heartburn and reflux symptoms. Symptoms completely resolve when on medication twice a day.  She has changed jobs and is now working [employer] and this is been an incredible stress relief for for her.  Again records reviewed including upper endoscopy and colonoscopy performed on 03/18/2013. 2 small adenomas removed from the colon with mild diverticulosis in the descending colon and healthy-appearing anastomosis. Upper endoscopy revealed moderate chronic active gastritis without H. pylori with no  Barrett's. There was a 2 cm hiatal hernia and reactive gastropathy   Review of Systems As per history of present illness, otherwise negative  Current Medications, Allergies, Past Medical History, Past Surgical History, Family History and Social History were reviewed in [employer] record.     Objective:   Physical Exam BP 104/80 mmHg  Pulse 112  Ht 5' 4.5" (1.638 m)  Wt 215 lb 4 oz (97.637 kg)  BMI 36.39 kg/m2 Constitutional: Well-developed and well-nourished. No distress. HEENT: Normocephalic and atraumatic. Oropharynx is clear and moist. No oropharyngeal exudate. Conjunctivae are normal. No scleral icterus. Neck: Neck supple. Trachea midline. Cardiovascular: Normal rate, regular rhythm and intact distal pulses. No M/R/G Pulmonary/chest: Effort normal and breath sounds normal. No wheezing, rales or rhonchi. Abdominal: Soft, mild left upper/middle quadrant abdominal pain without rebound or guarding, nondistended. Bowel sounds active throughout.  Multiple well-healed abdominal scars Extremities: no clubbing, cyanosis, or edema Neurological: Alert and oriented to person place and time. Psychiatric: Normal mood and affect. Behavior is normal.  CBC    Component Value Date/Time   WBC 7.4 11/10/2013 0625   RBC 4.12 11/10/2013 0625   HGB 12.7 11/10/2013 0625   HCT 38.0 11/10/2013 0625   PLT 183 11/10/2013 0625   MCV 92.2 11/10/2013 0625   MCH 30.8 11/10/2013 0625   MCHC 33.4 11/10/2013 0625   RDW 12.8 11/10/2013 0625   LYMPHSABS 2.1 09/25/2012 1726   MONOABS 0.5 09/25/2012 1726   EOSABS 0.1 09/25/2012 1726   BASOSABS 0.0 09/25/2012 1726    CMP     Component Value Date/Time   NA 137 11/10/2013 0625   K 3.9 11/10/2013 0625   CL 99 11/10/2013 0625  CO2 27 11/10/2013 0625   GLUCOSE 94 11/10/2013 0625   BUN 13 11/10/2013 0625   CREATININE 0.85 11/10/2013 0625   CALCIUM 9.3 11/10/2013 0625   PROT 7.6 09/25/2012 1726   ALBUMIN 3.9 09/25/2012 1726    AST 15 09/25/2012 1726   ALT 26 09/25/2012 1726   ALKPHOS 81 09/25/2012 1726   BILITOT 0.2* 09/25/2012 1726   GFRNONAA 80* 11/10/2013 0625   GFRAA >90 11/10/2013 0625    Recent labs reviewed from primary care dated 04/05/2014 A1c 5.5 ALT mildly elevated at 56, AST normal at 26, alkaline phosphatase 87, total bili 0.6, glucose 133, remainder of CMP within normal limits TSH normal CBC dated 02/10/14 within normal limits      Assessment & Plan:  49 year old female with past medical history of diverticulosis status post sigmoid resection for diverticulitis, history of C. 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  1. Chronic left-sided abd pain/SCAD -- her chronic left-sided abdominal pain responded dramatically to Lialda and rifaximin arguing for an element of segmental colitis. Given the effectiveness of this medication we will continue Lialda 2.4 g daily. She is getting over a viral gastroenteritis and I have recommended Florastor 250 mg twice daily for a month. We will also refill rifaximin which she may need should the pain worsen in the left abdomen.  2. GERD -- continue omeprazole 40 mg twice daily. Symptoms return on once daily therapy. EGD reviewed in no evidence of Barrett's or H. pylori. GERD diet. Weight loss will also likely improve GERD symptoms and may allow [place] to decrease the dose of daily PPI. I advised that she have her magnesium checked once yearly with primary care while on PPI  3. Elevated liver enzymes -- mild elevation in ALT is most likely secondary to fatty liver disease. She was told at the time of her cholecystectomy that she had "fatty liver". We discussed that the best treatment for this is diet, exercise and weight reduction. She is well aware of this. She is trying to lose weight by exercise and eating more healthy. She has been  successful in losing 6 pounds. I encouraged her to continue this. I recommend following liver enzymes and starting vitamin E 800 international units daily. If liver enzymes remain persistently elevated or become more so I recommend abdominal ultrasound.  4. Viral gastroenteritis -- resolving, multiple people affected where she works argues for viral infection. 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 Florastor 250 mg twice a day and ongoing therapy as discussed in #1  Follow-up in 6 months, sooner if necessary

## 2014-04-21 NOTE — Patient Instructions (Signed)
We have sent the following medications to your pharmacy for you to pick up at your convenience: Florastor 1 capsule twice daily x 1 month Lialda 2 capsules daily Vitamin E 800 iu daily  We have sent your information to [facility] for Xifaxan prescription.  Please continue omeprazole 40 mg twice daily.  Please follow up with Dr Hilarie Fredrickson in 6 months.  CC:Dr Karle Starch

## 2014-06-28 ENCOUNTER — Encounter: Payer: Self-pay | Admitting: Internal Medicine

## 2014-06-28 MED ORDER — RIFAXIMIN 550 MG PO TABS: 550.0000 mg | ORAL_TABLET | Freq: Three times a day (TID) | ORAL | Status: DC

## 2014-06-28 NOTE — Telephone Encounter (Signed)
Okay to repeat rifaximin 550 mg TID x 10 days

## 2014-07-01 ENCOUNTER — Telehealth: Payer: Self-pay | Admitting: Internal Medicine

## 2014-07-01 NOTE — Telephone Encounter (Signed)
[name], I think you were working on this.Marland KitchenMarland KitchenMarland Kitchen

## 2014-07-01 NOTE — Telephone Encounter (Signed)
Script faxed to [facility]. Pt aware.

## 2014-07-04 NOTE — Discharge Summary (Signed)
PATIENT NAME:  [name], [name] MR#:  [id number] DATE OF BIRTH:  16-Aug-1965  DATE OF ADMISSION:  03/12/2011 DATE OF DISCHARGE:  03/18/2011  ADMITTING DIAGNOSIS: Recurrent diverticulitis.   DISCHARGE DIAGNOSIS: Recurrent diverticulitis.    HISTORY OF PRESENT ILLNESS: This is a 49 year old female who had had multiple episodes of left lower quadrant pain with tenderness and CT findings of extensive diverticulosis. She presented for elective surgery on 03/12/2011.   HOSPITAL COURSE: The patient underwent laparoscopic sigmoid colectomy. She did well with the surgery and was admitted to the floor after recovery. She was started on a morphine PCA, but had some rash with this and, therefore, was transitioned to Dilaudid. She did have significant pain and was needing to take IV pain medication for at least three days after surgery. She was then able to transition to oral pain medication only. On postoperative day #2, she developed a fever to 101.3. She was started on Zosyn. She did not have any further fever. She did have a liquid bowel movement on postoperative day #2 and continued to have bowel function throughout her hospital stay. She started on clear liquids on postoperative day three and was able to advance to a soft diet by the day of discharge. On the day of discharge, she was examined by the covering physician. Incision was intact with staples. Abdomen soft with minimal tenderness. Discharge plan was discussed with the patient.   DISPOSITION: Discharged home in good condition with self-care.   MEDICATIONS:  1. Cymbalta 30 mg daily.  2. Zofran 4 mg every eight hours as needed.  3. Multivitamin daily.  4. Ultram ER 300 mg daily as needed.  5. Lunesta 2 mg at bedtime as needed. 6. Diphenhydramine 50 mg to 100 mg daily as needed for itching. 7. Clonazepam 1 mg 3 times a day as needed. 8. Metoprolol 25 mg twice daily.  9. Percocet 5/325, one to two tabs as needed.  10. Omeprazole 40 mg b.i.d.   11. Tramadol 50 mg twice daily as needed.  12. Methocarbamol 500 mg 2 tabs at bedtime.  13. Dilaudid 2 mg every six hours as needed.   INSTRUCTIONS:  1. No dressings needed.  2. May shower.  3. Regular diet.  4. No exertional activity or heavy lifting. 5. Follow-up next week for suture removal or sooner if you develop any fever, increasing abdominal pain, nausea, vomiting, or other concerns.   ____________________________ Celene Squibb. [place], [place] amc:ap D: 03/19/2011 11:29:29 ET T: 03/20/2011 13:21:37 ET JOB#: [id number]  cc: Celene Squibb. Theda Sers, [place], <Dictator> Mar Walmer M Geovany Trudo PA ELECTRONICALLY SIGNED 03/20/2011 15:03

## 2014-07-04 NOTE — H&P (Signed)
PATIENT NAME:  [name], [name] MR#:  [id number] DATE OF BIRTH:  11/20/1965  DATE OF ADMISSION:  07/31/2011  PRIMARY CARE PHYSICIAN: Dr. Candace [name]    HISTORY OF PRESENT ILLNESS: The patient is a 49 year old female with a past medical history of diverticulitis status post partial colectomy secondary to complications, chronic pain status post injury to the back, and anxiety who presented to Dr. Myrna Blazer office for increasing left lower quadrant pain. She had a CT scan of the abdomen and pelvis on 07/26/2011 without contrast that showed some pericolonic inflammatory changes involving the mild descending colon most consistent with diverticulitis. She was started on Augmentin and Flagyl as she has an allergy to ciprofloxacin. She continues with low-grade fever and chills as well as sweats, some nausea and left lower quadrant pain. The patient is admitted directly from Dr. Myrna Blazer office for further management and evaluation.   REVIEW OF SYSTEMS: CONSTITUTIONAL: Positive for chills, fatigue, and weakness. EYES: No blurry or double vision. ENT: No ear pain, hearing loss, seasonal allergies. RESPIRATORY: No cough, wheezing, hemoptysis, COPD. CARDIOVASCULAR: No chest pain, orthopnea, edema, arrhythmia, palpitations, syncope. GI: Positive nausea. No vomiting or diarrhea. Positive left lower quadrant abdominal pain. Positive history of gastroesophageal reflux disease. GU: Positive dysuria, frequency, and urgency. ENDOCRINE: No polyuria or polydipsia. HEME/LYMPH: No easy bruising. SKIN: No rash or lesions. MUSCULOSKELETAL: No gout or swelling. NEUROLOGIC: No history of CVA, TIA, or ataxia. PSYCH: Positive anxiety. No ADD, OCD, or bipolar.   PAST MEDICAL HISTORY:  1. Diverticulitis requiring partial colectomy last year.  2. Gastroesophageal reflux disease.  3. Chronic pain status post injury to back.  4. Heart palpitations.  5. Anxiety.  6. Cold sores.  7. Attention deficit/hyperactivity disorder.   PAST SURGICAL HISTORY:   1. ACL repair right and left knees. 2. Right wrist repair.  3. Cholecystectomy.  4. Kidney stone retrieval.    MEDICATIONS:  1. Omeprazole 40 mg 2 capsules by mouth daily.  2. Tramadol 50 mg 2 tablets b.i.d. p.r.n.  3. Methocarbamol 500 mg t.i.d. p.r.n.  4. Metoprolol 25 mg b.i.d. p.r.n.  5. Valtrex as needed.  6. Mega Red 1 capsule by mouth t.i.d.  7. Biotin 1 tablet daily.  8. Vitamin D3 1 tablet daily.  9. Co-Q 10 1 tablet daily.  10. Theanine 1 capsule by mouth daily.  11. Magnesium b.i.d.  12. Multivitamin daily p.r.n.  13. Augmentin. 14. Flagyl.  ALLERGIES: Cipro causes joint swelling. IVP dye causes swelling and hives.   FAMILY HISTORY: No family history of colon cancer or polyps. Mother has history of lupus, OA, and osteoporosis. Father history of ligament disorder.   SOCIAL HISTORY: No tobacco, alcohol, or drug use. 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   PHYSICAL EXAMINATION:   VITAL SIGNS: Temperature 98.7, pulse 76, respirations 16, blood pressure 145/91, 98% on room air.   GENERAL: The patient is alert and oriented not in acute distress.   HEENT: Head is atraumatic. Pupils are round. Sclerae anicteric. Mucous membranes are moist.   NECK: Supple without JVD, carotid bruit, or enlarged thyroid.   CARDIOVASCULAR: Regular rate and rhythm. No murmurs, rubs, or gallops. PMI not displaced.   LUNGS: Anteriorly clear to auscultation without crackles, rales, rhonchi, or wheezing.   ABDOMEN: Left lower quadrant tenderness. No rebound or guarding. Hypoactive bowel sounds.   EXTREMITIES: No clubbing, cyanosis, or edema.   NEUROLOGIC: Cranial nerves II through XII grossly intact. No focal deficits.   SKIN: Without rash or lesions.  LABORATORY, DIAGNOSTIC, AND RADIOLOGICAL DATA: Laboratories from Dr. Myrna Blazer office on April 16th white blood cells 8.5, hemoglobin 12.8, hematocrit 37, platelets 258, alkaline phosphatase 80, total bilirubin 0.5, BUN 14, calcium 9.5, CO2  27.8, chloride 104, creatinine 0.8, glucose 92, potassium 4.8, total protein 7.3, AST 17, ALT 24, sodium 138. CRP 83.6 and ESR 72, both of which are high.   ASSESSMENT AND PLAN: This is a 49 year old female who is being treated as an outpatient for diverticulitis seen on noncontrast scan on May 16th with Augmentin and Flagyl due to an allergy to quinolones who continues to have persistent left lower quadrant pain and some nausea with an extensive history of diverticulitis and complications in the past.  1. Diverticulitis. We will go ahead and repeat the CT with a contrasted study and premedicate prior to this. Also go ahead and get Dr. Tamala Julian who did her surgery previously on board. Will also consult GI and have started Invanz.  2. Chronic pain. Can use IV medications for now.  3. Dysuria. Will check a urinalysis.   CODE STATUS: The patient is FULL CODE status.   TIME SPENT: 50 minutes.  ____________________________ Donell Beers. Benjie Karvonen, MD spm:drc D: 07/31/2011 15:43:48 ET T: 07/31/2011 16:13:25 ET JOB#: [id number] cc: Teresa Bogie P. Benjie Karvonen, MD, <Dictator>, Teresa Dawn. Candace Cruise, MD Donell Beers Kenitha Glendinning MD ELECTRONICALLY SIGNED 07/31/2011 16:54

## 2014-07-04 NOTE — Consult Note (Signed)
Chief Complaint:   Subjective/Chief Complaint Overall abd pain better. Able to get some sleep this AM. Diarrhea but no bleeding. No fever in hospital.   VITAL SIGNS/ANCILLARY NOTES: **Vital Signs.:   22-May-13 09:23   Vital Signs Type Routine   Temperature Temperature (F) 98.6   Celsius 37   Temperature Source oral   Pulse Pulse 68   Pulse source per Dinamap   Respirations Respirations 18   Systolic BP Systolic BP 010   Diastolic BP (mmHg) Diastolic BP (mmHg) 64   Mean BP 76   BP Source Dinamap   Pulse Ox % Pulse Ox % 95   Pulse Ox Activity Level  At rest   Oxygen Delivery Room Air/ 21 %   Brief Assessment:   Cardiac Regular    Respiratory clear BS    Gastrointestinal tenderness in LUQ/LLQ area   Routine Hem:  22-May-13 02:52    WBC (CBC) 10.4   RBC (CBC) 4.02   Hemoglobin (CBC) 12.2   Hematocrit (CBC) 36.0   Platelet Count (CBC) 268   MCV 89   MCH 30.4   MCHC 34.0   RDW 13.6   Neutrophil % 91.6   Lymphocyte % 7.4   Monocyte % 0.8   Eosinophil % 0.0   Basophil % 0.2   Neutrophil # 9.5   Lymphocyte # 0.8   Monocyte # 0.1   Eosinophil # 0.0   Basophil # 0.0  Routine Chem:  22-May-13 02:52    Glucose, Serum 151   BUN 15   Creatinine (comp) 0.85   Sodium, Serum 136   Potassium, Serum 4.5   Chloride, Serum 102   CO2, Serum 25   Calcium (Total), Serum 8.7   Anion Gap 9   Osmolality (calc) 276   eGFR (African American) >60   eGFR (Non-African American) >60   Assessment/Plan:  Assessment/Plan:   Assessment Diverticulitis. Slowly improving.    Plan Continue IV Abx. Agree with starting clears. Advance diet as tolerated. Thanks.   Electronic Signatures: Verdie Shire (MD)  (Signed [phone] 12:54)  Authored: Chief Complaint, VITAL SIGNS/ANCILLARY NOTES, Brief Assessment, Lab Results, Assessment/Plan   Last Updated: 22-May-13 12:54 by Verdie Shire (MD)

## 2014-07-04 NOTE — Consult Note (Signed)
PATIENT NAME:  [name], LADY MR#:  [id number] DATE OF BIRTH:  05-07-1965  DATE OF CONSULTATION:  07/31/2011  REFERRING PHYSICIAN:   CONSULTING PHYSICIAN:  Loreli Dollar, MD  HISTORY OF PRESENT ILLNESS: This 49 year old female was admitted emergently with a chief complaint of left abdominal pain. She had had acute onset of left abdominal pain some eight days ago and had some low-grade fever. She has had some nausea, three episodes of vomiting since then. She was evaluated by Ebony Cargo, NP and Dr. Verdie Shire at the [facility]. Had CT scan on 05/16 which I have reviewed the images which demonstrate some swelling in the region of the descending colon. I see a number of diverticula in this area. She was treated for diverticulitis with a course of Augmentin and Flagyl. She felt a little better the next day after starting the antibiotics but then plateaued and has had no further improvement. She initially started taking a clear liquid diet but has taken some bland foods, still having some mild nausea, some low-grade fever. Not having any chills. She has had some diarrhea. Not passing any blood.   PAST MEDICAL HISTORY:  1. History of chronic back pain which has been better in recent months.  2. History of palpitations and still occasionally gets palpitations.  3. History of gastroesophageal reflux. 4. Anxiety.  5. History of kidney stones.  6. Still has her female organs. Other past history reviewed.   PAST SURGICAL HISTORY:  1. Did have laparoscopic cholecystectomy 1992.  2. Laparoscopic low anterior resection of the distal sigmoid colon in December 2012.    MEDICATIONS:  1. Omeprazole. 2. Tramadol. 3. Methocarbamol.  4. Metoprolol. 5. Valtrex.  6. Multivitamin.  7. Recent Augmentin and Flagyl.   ALLERGIES: Cipro and intravenous pyelogram dye.   FAMILY HISTORY: Negative for colon cancer.   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 Does not drink any alcohol. Recently has  been having some fiber in her diet.   REVIEW OF SYSTEMS: She does report her back pain is improved over what it has been in the past. Has had no recent acute illness such as cough, cold, or sore throat. Has had some dysuria and increase in urinary frequency and urgency.   PHYSICAL EXAMINATION:  GENERAL: She is awake, alert and oriented, resting in hospital bed.   VITAL SIGNS: Temperature 98.7, pulse 76, respirations 16, blood pressure 145/91, pulse 98.   HEENT: Palpebral conjunctivae slightly pale. Pharynx clear.   LUNGS: Lungs sounds are clear.   HEART: Regular rhythm, S1 and S2.   ABDOMEN: Obese. She has some localized left mid abdominal tenderness in the region of the anterior axillary line. No palpable mass. There is no suprapubic tenderness. Her incisions are healing satisfactorily.   LABORATORY, DIAGNOSTIC AND RADIOLOGICAL DATA: Noted she had lab work on 07/26/2011 where her white blood count was 8500 and hemoglobin 12.8. Comprehensive metabolic panel was normal. CRP was elevated at 83.6 and sedimentation rate elevated at 72.   I reviewed her CT images which were done 05/16.   I also reviewed CT images which were done on 12/22/2010 which was before surgery. At that time she did have extensive distal sigmoid colon diverticulosis and that segment of colon has been resected. I have reviewed the CT scan done 07/26/2011 which demonstrates a focal area consistent with diverticulitis and diverticula in the descending colon which is significantly upstream from the resected segment.   IMPRESSION: Diverticulitis of the sigmoid colon.  RECOMMENDATIONS: Agree with intravenous Invanz. Initially keep n.p.o. Understand there is a plan for repeat CT scan which may be helpful. hopefully rule out abscess.   I don't think she necessarily needs any surgery at present but hopefully this will resolve with antibiotics.   ____________________________ Lenna Sciara. Rochel Brome, MD jws:cms D: 07/31/2011 18:10:24  ET T: 08/01/2011 09:12:10 ET JOB#: [id number]  cc: Loreli Dollar, MD, <Dictator> Loreli Dollar MD ELECTRONICALLY SIGNED 08/06/2011 11:13

## 2014-07-04 NOTE — Consult Note (Signed)
Chief Complaint:   Subjective/Chief Complaint Continues to improve. No pain meds since last night. CT shows improvement of diverticulitis.   VITAL SIGNS/ANCILLARY NOTES: **Vital Signs.:   23-May-13 06:18   Vital Signs Type Routine   Temperature Temperature (F) 98.6   Celsius 37   Temperature Source oral   Pulse Pulse 64   Respirations Respirations 20   Systolic BP Systolic BP 440   Diastolic BP (mmHg) Diastolic BP (mmHg) 60   Mean BP 78   BP Source Dinamap   Pulse Ox % Pulse Ox % 94   Pulse Ox Activity Level  At rest   Oxygen Delivery Room Air/ 21 %   Brief Assessment:   Cardiac Regular    Respiratory clear BS    Gastrointestinal mild LLQ tenderness   Routine Hem:  22-May-13 02:52    WBC (CBC) 10.4   RBC (CBC) 4.02   Hemoglobin (CBC) 12.2   Hematocrit (CBC) 36.0   Platelet Count (CBC) 268   MCV 89   MCH 30.4   MCHC 34.0   RDW 13.6   Neutrophil % 91.6   Lymphocyte % 7.4   Monocyte % 0.8   Eosinophil % 0.0   Basophil % 0.2   Neutrophil # 9.5   Lymphocyte # 0.8   Monocyte # 0.1   Eosinophil # 0.0   Basophil # 0.0  Routine Chem:  22-May-13 02:52    Glucose, Serum 151   BUN 15   Creatinine (comp) 0.85   Sodium, Serum 136   Potassium, Serum 4.5   Chloride, Serum 102   CO2, Serum 25   Calcium (Total), Serum 8.7   Anion Gap 9   Osmolality (calc) 276   eGFR (African American) >60   eGFR (Non-African American) >60   Radiology Results: CT:    22-May-13 07:47, CT Abdomen and Pelvis With Contrast   CT Abdomen and Pelvis With Contrast    REASON FOR EXAM:    (1) diverticulitis; (2) diverticulitis  COMMENTS:       PROCEDURE: CT  - CT ABDOMEN / PELVIS  W  - Aug 01 2011  7:47AM     RESULT:     Comparison is made to a prior study dated 07/26/2011.    Technique: Helical 3 mm sections were obtained from the lung bases   through the pubic symphysis status post intravenous administration of 85   mL of Isovue-370 and oral contrast.    Findings: The lung base  is unremarkable.    Previously described inflammatory changes involving the descending colon   described on the 07/26/2011 study have decreased. There is no evidence of   free fluid or loculated fluid collections. There has been decrease in the   bowel wall thickening as well as the inflammatory changes in the   pericolonic fat. Mildstranding is identified. There is evidence of   diverticulosis.    The liver, spleen, adrenals, pancreas and kidneys are unremarkable. There   is no CT evidence of bowel obstruction, appendicitis. There is no   evidence of free fluid or loculated fluid collections.    IMPRESSION:     CT improvement in the patient's sequela of diverticulitis involving the   descending colon. There is no evidence of pneumoperitoneum or associated     loculated fluid collections to suggest abscess.    Thank you for the opportunity to contribute to the care of your patient.           Verified By: Lottie Mussel.  Burt Knack, M.D., MD   Assessment/Plan:  Assessment/Plan:   Assessment Diverticulitis.Improving.    Plan Give another dose of IV invanz later today. Perhaps can be discharged afterwards on oral Abx if patient remains stable. Make sure patient f/u with Ebony Cargo next week in office. Will sign off. Thanks.   Electronic Signatures: Verdie Shire (MD)  (Signed [phone] 12:59)  Authored: Chief Complaint, VITAL SIGNS/ANCILLARY NOTES, Brief Assessment, Lab Results, Radiology Results, Assessment/Plan   Last Updated: 23-May-13 12:59 by Verdie Shire (MD)

## 2014-07-04 NOTE — Discharge Summary (Signed)
PATIENT NAME:  [name], BOCANEGRA MR#:  [id number] DATE OF BIRTH:  [birth year]  DATE OF ADMISSION:  07/31/2011 DATE OF DISCHARGE:  08/02/2011  PRESENTING COMPLAINT: Abdominal pain and nausea.   DISCHARGE DIAGNOSES:  1. Acute descending colon diverticulitis.  2. Chronic back pain.   CONDITION ON DISCHARGE: Fair.   MEDICATIONS:  1. Patient advised to complete her Augmentin and Flagyl that was prescribed as outpatient by Dr. Candace Cruise as directed.  2. Omeprazole 40 mg p.o. b.i.d.  3. Zofran 8 mg every eight hours as needed.  4. Clonazepam 1 mg as needed.  5. Methocarbamol 500 mg 3 times a day.  6. Metoprolol 25 mg b.i.d.  7. Multivitamin p.o. daily.  8. Adderall XR 25 mg p.o. daily.  9. B complex vitamin p.o. daily.  10. Biotin 5 mg p.o. daily.  11. Co Q-10 100 p.o. daily.  12. Ibuprofen 800 mg 3 times a day with meals.  13. Magnesium 1 capsule orally b.i.d.  14. Melatonin 5 mg p.o. at bedtime.  15. Vitamin D3 400 international units daily.   DIET: Soft diet.   FOLLOW-UP: Follow-up with Dr. Candace Cruise on Friday June 14th at 10:45 a.m.   LABORATORY, DIAGNOSTIC, AND RADIOLOGICAL DATA: CT of the abdomen and pelvis done on 05/22 shows improvement of the patient's sequela of diverticulitis involving the descending colon. CBC within normal limits. Basic metabolic panel within normal limits except blood glucose of 151. Urinalysis negative for urinary tract infection. Blood cultures no growth. Stool cultures no growth. C. difficile negative.   CONSULTATION: Dr. Candace Cruise.   BRIEF SUMMARY OF HOSPITAL COURSE: [name] is a 48 year old Caucasian female who has history of diverticulitis admitted from Dr. Myrna Blazer office with left lower quadrant pain, recent diagnosis of diverticulitis by CT which was done on 07/26/2011 and was on Flagyl and Augmentin continued to have nausea. She was admitted with:  1. Acute diverticulitis. The patient has had sigmoid colon resection last year. CT of the abdomen on admission 05/22  showed improved inflammation from previous CT of 07/26/2011. She was on IV Invanz and Flagyl. GI consultation with Dr. Candace Cruise was obtained. The patient was started on p.o. liquid diet which she tolerated. White count remained stable. She was changed to p.o. Augmentin and Flagyl which she will finish up her course that was prescribed earlier.  2. Chronic pain. She is back on her Robaxin and ibuprofen.  3. Anxiety on Klonopin.   Hospital stay otherwise remained stable.   CODE STATUS: The patient remained a FULL CODE.   TIME SPENT: 40 minutes.   ____________________________ Hart Rochester Posey Pronto, MD sap:rbg D: 08/02/2011 16:08:16 ET T: 08/03/2011 12:06:51 ET JOB#: [id number]  cc: Charlyn Vialpando A. Posey Pronto, MD, <Dictator> Lupita Dawn. Candace Cruise, MD Ilda Basset MD ELECTRONICALLY SIGNED 08/10/2011 22:46

## 2014-07-04 NOTE — Consult Note (Signed)
Brief Consult Note: Diagnosis: Admitted for Acute diverticuliltis.  Patient was seen in our office today for follow-up acute diverticulitis diagnosed 07/26/2011.  Failure with outpatient medical management.  Continued abdominal pain to left lower quadrant with fevers and chills.  Office note for today's date with complete details placed on patients chart at the hospital.  See note for complete details and documentation.   Consult note dictated.   Discussed with Attending MD.   Comments: Patient's presentation was discussed with Dr. Verdie Shire.  Feel hospitalization is warranted for patient to receive IV antibiotic therapy and continued management.  Will continue to follow during hospitalization.  Depending on patient's progression patient may need to have surgical evaluation during hospitalization. Patient will be seen by Dr. Verdie Shire tomorrow.  Electronic Signatures: Payton Emerald (NP)  (Signed 21-May-13 14:56)  Authored: Brief Consult Note   Last Updated: 21-May-13 14:56 by Payton Emerald (NP)

## 2014-07-04 NOTE — Op Note (Signed)
PATIENT NAME:  ELLYN, [name] MR#:  [id number] DATE OF BIRTH:  09/19/1965  DATE OF PROCEDURE:  03/12/2011  PREOPERATIVE DIAGNOSIS: History of diverticulitis.   POSTOPERATIVE DIAGNOSIS:  History of diverticulitis.  PROCEDURE: Laparoscopic sigmoid colectomy.   SURGEON:  Rochel Brome, MD   ANESTHESIA: General.   INDICATIONS: This 49 year old female has had episodes of left lower quadrant pain with tenderness and CT findings of extensive diverticulosis. She has had some extensive evaluation. It was determined that the likely cause of her continued pain is associated with bouts of diverticulitis and surgery was recommended for definitive treatment.   DESCRIPTION OF PROCEDURE: The patient was placed on the operating table in the supine position under general anesthesia. The circulating nurse inserted a Foley urinary catheter with Betadine preparation of the perineum draining clear yellow urine. The legs were placed on bumblebee stirrups so that she was in the lithotomy position. The perineum and anal areas were prepared with Betadine solution. The abdomen was prepared with ChloraPrep and draped in a sterile manner.   A short incision was made just below the umbilicus, oriented longitudinally, and carried down through subcutaneous tissues to encounter the deep fascia which was grasped with a laryngeal hook and elevated. A Veress needle was inserted, aspirated, and irrigated with a saline solution. Next, the peritoneal cavity was inflated with carbon dioxide. The Veress needle was removed. The 10-mm cannula was inserted. The 10-mm, 25-degree laparoscope was inserted to view the peritoneal cavity. Initially all that was seen was omentum. The patient was placed in Trendelenburg position. Another incision was made in the right lower quadrant lateral to the inferior epigastric vessels to insert a 12-mm cannula. Another was inserted in the left lower quadrant lateral to the inferior epigastric vessels to  insert an 11-mm cannula.   The patient was placed further in Trendelenburg position. There were multiple adhesions between the omentum and the pelvic sidewall and the left tube and ovary. These adhesions were taken down with blunt and sharp dissection and also use of the Harmonic scalpel. There were adhesions also between the colon and the omentum. These were further taken down with additional dissection and the omentum was retracted up into the upper abdomen. Next multiple loops of small bowel, which were adherent to the colon and pelvic sidewall were taken down with additional blunt and sharp dissection and the small bowel was mobilized up out of the pelvis. We put the patient in just a little more Trendelenburg position to keep the intestines from sliding back down into the pelvis. Next, the colon was examined. There were a number of diverticula appreciated. The sigmoid colon was mobilized with incision of the peritoneal reflection. This was carried up onto the descending colon, mobilizing from the descending colon down to the distal sigmoid colon. Next, a distal margin for anastomosis was selected at the rectosigmoid junction and dissection was carried out with the microdissector and also with the Harmonic scalpel and the grasper. It was necessary to put one more incision in the suprapubic area with an 11-mm cannula as traction was applied to the colon and as the dissection was carried out to create a window in the mesentery. Subsequently, the bowel was divided with the blue cartridge Endo GIA 45-mm stapler. This was done with three loads of the stapling instrument. Next, the mesenteric dissection was carried out with the Harmonic scalpel and also with white loads of the Endo GIA stapler. The proximal margin of resection was selected and what appeared to be the  mid to upper mid aspect of the sigmoid colon was selected, and a window was created in the mesentery at this point. Then the white cartridge Endo GIA  stapler was again activated from this opening in the mesentery posteriorly and then the mesenteric dissection was further completed with the Harmonic scalpel. It is noted that during the course of the mesenteric dissection there was some moderate blood loss. Several bleeding points were controlled with the Harmonic scalpel. Hemostasis was subsequently intact.   Next, a lower abdominal midline incision was made for extraction of the colon. This was carried down through the midline fascia. Several small bleeding points were cauterized and the peritoneal cavity was opened. The bowel, which was held with a Babcock clamp, was brought up into the wound and delivered up out of the abdomen until the proximal margin of the mesenteric dissection was identified. The bowel was divided with electrocautery and submitted for routine pathology, noting that the distal end contained a staple line. Next, a 3-0 Prolene pursestring suture was placed. Also sizers were used and it was determined to use the 29-mm EEA stapler. The anvil was disengaged and placed into the bowel wall and the pursestring tied down. There was some difficulty pulling down the pursestring, and with tying the knot the suture broke. Therefore, another 3-0 Prolene was selected and the pursestring was sewn again, this time successfully tying the pursestring down around the anvil. Next, the distal bowel and  anvil were brought back into the peritoneal cavity. The peritoneum was closed with running 0 chromic. The fascia was closed with running 0 Maxon suture. The laparoscope was reinserted. The small, medium, and large sizers were introduced through the rectum and passed up to the distal staple line. Next, the 29-mm EEA stapler was inserted into the rectum and passed up to the staple line. The pin was advanced out just posterior to the staple line and was attached to the anvil. The EEA was then engaged with laparoscopic vision, and after reaching a satisfactory point  in the firing range, the EEA was activated. It was then disengaged and removed. There was a defect in the distal ring.  The proximal ring was intact.   Next, with the laparoscope the anastomosis was examined and there was an opening in the anterior aspect of the anastomosis. This was closed with interrupted 0 Polysorb sutures using the Endo stitch instrument. Following this the anastomosis looked good. There were approximately 6 stitches used for this repair. Next, the flexible colonoscope was inserted through the rectum and passed up and visualized the anastomosis. There was no leakage of gas. The scope was advanced up beyond the anastomosis and gradually pulled back. A picture was taken. The rectum appeared normal and the scope was removed. Gloves were changed.   The laparoscopic instruments were further used to examine the anastomosis and it looked good. A small amount of blood was aspirated from the pelvis. Hemostasis appeared to be intact. Next, the laparoscopic instruments were removed. Carbon dioxide was allowed to escape from the peritoneal cavity. All skin incisions were closed with interrupted 4-0 nylon vertical mattress sutures. Dressings were applied with paper tape. The patient tolerated surgery satisfactorily. Urine output was good. Foley catheter was removed. The patient is now being prepared for transfer to the recovery room.   ____________________________ Lenna Sciara. Rochel Brome, MD [id number]:bjt D: 03/12/2011 15:31:24 ET T: 03/12/2011 16:26:13 ET JOB#: [id number]  cc: Loreli Dollar, MD, <Dictator> Loreli Dollar MD ELECTRONICALLY SIGNED 04/11/2011 18:16

## 2014-07-06 ENCOUNTER — Telehealth: Payer: Self-pay | Admitting: Internal Medicine

## 2014-07-06 NOTE — Telephone Encounter (Signed)
Pt states she was off of xifaxan for a couple of days and her pain got much worse on the left side of her abdomen. Pt is currently at [facility] walk-in clinic and they are going to do a CT scan to see if perhaps she has diverticulitis. Pt will call us with an update.

## 2014-07-09 ENCOUNTER — Telehealth: Payer: Self-pay | Admitting: Internal Medicine

## 2014-07-09 NOTE — Telephone Encounter (Signed)
Received records from [facility], sent to Dr. Hilarie Fredrickson. 07/09/14/ss

## 2014-07-12 ENCOUNTER — Telehealth: Payer: Self-pay | Admitting: Internal Medicine

## 2014-07-12 NOTE — Telephone Encounter (Signed)
Rifaximin will likely be difficult to get on a continuous basis.  When she uses the med, is she better? If so, you could try to rx for 550 mg BID OV with me

## 2014-07-12 NOTE — Telephone Encounter (Signed)
Left message for pt to call back.  Pt called last week and stated that she went 2 days without her Xifaxan and she started having a lot of pain on the left side of her abdomen. Pt went to [facility] and had a ct scan to r/o diverticulitis. CT did not show anything. Pt states they told her to take Ibuprofen and tylenol which she states she has already been doing. Pt states she just doesn't know what to do now. She states she is still having abdominal pain and she is tired. States she has lost 5-6lbs. Pt states she doesn't know what else to do. Please advise.

## 2014-07-13 NOTE — Telephone Encounter (Signed)
Pt states when she first started taking the xifaxan she did feel better. Pt states that now she really cannot see that it is helping very much. Pt scheduled to see Dr. Hilarie Fredrickson 07/15/14@8 :30am.

## 2014-07-15 ENCOUNTER — Ambulatory Visit (INDEPENDENT_AMBULATORY_CARE_PROVIDER_SITE_OTHER): Payer: PRIVATE HEALTH INSURANCE | Admitting: Internal Medicine

## 2014-07-15 ENCOUNTER — Encounter: Payer: Self-pay | Admitting: Internal Medicine

## 2014-07-15 VITALS — BP 130/84 | HR 70 | Ht 65.0 in | Wt 209.0 lb

## 2014-07-15 DIAGNOSIS — K5010 Crohn's disease of large intestine without complications: Secondary | ICD-10-CM

## 2014-07-15 DIAGNOSIS — R109 Unspecified abdominal pain: Secondary | ICD-10-CM | POA: Diagnosis not present

## 2014-07-15 DIAGNOSIS — R7989 Other specified abnormal findings of blood chemistry: Secondary | ICD-10-CM

## 2014-07-15 DIAGNOSIS — K589 Irritable bowel syndrome without diarrhea: Secondary | ICD-10-CM

## 2014-07-15 DIAGNOSIS — R110 Nausea: Secondary | ICD-10-CM | POA: Diagnosis not present

## 2014-07-15 DIAGNOSIS — K219 Gastro-esophageal reflux disease without esophagitis: Secondary | ICD-10-CM | POA: Diagnosis not present

## 2014-07-15 DIAGNOSIS — R945 Abnormal results of liver function studies: Secondary | ICD-10-CM

## 2014-07-15 MED ORDER — VSL#3 PO CAPS
2.0000 | ORAL_CAPSULE | Freq: Every day | ORAL | Status: DC
Start: 2014-07-15 — End: 2020-03-11

## 2014-07-15 MED ORDER — MESALAMINE 1.2 G PO TBEC: 2.4000 g | DELAYED_RELEASE_TABLET | Freq: Two times a day (BID) | ORAL | Status: DC

## 2014-07-15 MED ORDER — AMITRIPTYLINE HCL 25 MG PO TABS
25.0000 mg | ORAL_TABLET | Freq: Every day | ORAL | Status: DC
Start: 2014-07-15 — End: 2020-12-02

## 2014-07-15 MED ORDER — DICYCLOMINE HCL 10 MG PO CAPS: 10.0000 mg | ORAL_CAPSULE | Freq: Four times a day (QID) | ORAL | Status: DC | PRN

## 2014-07-15 NOTE — Progress Notes (Signed)
Subjective:    Patient ID: [name], female    DOB: Mar 29, 1965, 49 y.o.   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  She is here alone today and was last seen in Feb 2016.  She's been maintained on Lialda 2.4 g daily and has been treated several times with 14 days of rifaximin. Her symptoms are primarily left-sided abdominal pain, bloating and fatigue. The mesalamine and rifaximin periodically had been helping tremendously.  The last several weeks symptoms returned specifically left-sided abdominal pain, bloating and nausea. Appetite has been decreased. She's noted increasing fatigue. Rifaximin was again prescribed but she has been using 550 mg 3 times daily for the last 9-10 days. This time it's been no help. She has continued mesalamine 2.4 g daily. She is using MiraLAX once to twice daily to avoid constipation and this is helping from a constipation perspective. She's having 1-2 soft but formed stools daily. No rectal bleeding or melena. Zofran is helping some for nausea but not completely. She's noticed more reflux and heartburn symptoms despite being on omeprazole 40 mg twice daily. She's noticed increased abdominal bloating. She was using ibuprofen but stopped on advice from her primary doctor that this may be causing GI pain or even ulcers. She is frustrated with this pain.  She is sleeping at night but using Ambien to do so. She is also using hydrocodone which was recently prescribed for this pain. She is using this at night only it causes itching and so she needs to take Benadryl with it. She's been on gabapentin for some time which she notes she is weaning off.  She was recently seen in the ED to evaluate her GI  complaints.  Recent labs were "normal" and CT scan abd/pelvis = no acute findings. Nonobstructing right renal colliculi identified. Postoperative changes from partial resection of the sigmoid colon. No complications.  Again records reviewed including upper endoscopy and colonoscopy performed on 03/18/2013. 2 small adenomas removed from the colon with mild diverticulosis in the descending colon and healthy-appearing anastomosis. Upper endoscopy revealed moderate chronic active gastritis without H. pylori with no Barrett's. There was a 2 cm hiatal hernia and reactive gastropathy  Review of Systems As per history of present illness, otherwise negative  Current Medications, Allergies, Past Medical History, Past Surgical History, Family History and Social History were reviewed in [employer] record.     Objective:   Physical Exam BP 130/84 mmHg  Pulse 70  Ht $R'5\' 5"'Oo$  (1.651 m)  Wt 209 lb (94.802 kg)  BMI 34.78 kg/m2 Constitutional: Well-developed and well-nourished. No distress. HEENT: Normocephalic and atraumatic.  Conjunctivae are normal.  No scleral icterus. Neck: Neck supple. Trachea midline. Cardiovascular: Normal rate, regular rhythm and intact distal pulses. No M/R/G Pulmonary/chest: Effort normal and breath sounds normal. No wheezing, rales or rhonchi. Abdominal: Soft, right left-sided abdominal tenderness without rebound or guarding, nondistended. Bowel sounds active throughout. There are no masses palpable. No hepatosplenomegaly. Extremities: no clubbing, cyanosis, or edema Neurological: Alert and oriented to person place and time. Skin: Skin is warm and dry. No rashes noted. Psychiatric: Normal mood and affect. Behavior is normal.  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   1. Chronic left-sided abd pain/IBS/SCAD -- exacerbation of left-sided abdominal pain in the setting of IBS and probable signal colitis. Recent CT scan reviewed and normal from GI perspective. Labs also normal. Long discussion today regarding treatment. I agree with cessation of ibuprofen as this can cause GI upset. I have recommended increasing Lialda 4.8 g daily and adding VSL #3 as a probiotic. 2 capsules daily. Labs today CRP and ESR. Continue Bentyl 10 mg 4 times daily as needed. Will begin amitriptyline 25 mg daily at bedtime to help with abdominal pain which is chronic and also IBS. I have advised her to discontinue hydrocodone and tramadol completely. We'll also discontinue rifaximin at this time.  2 GERD -- exacerbation of symptoms. Continue omeprazole 40 mg twice daily. Ranitidine OTC per box instructions for breakthrough symptoms as needed  3. Elevated liver enzymes -- recently normalized as of 07/13/2014. Follow periodically with primary care  40 minutes spent with patient and with correlation of care, greater than 50% face-to-face counseling of the above issues

## 2014-07-15 NOTE — Patient Instructions (Addendum)
We have requested your recent labwork from Dr Vista Lawman office.  Please continue to refrain from Ibuprofen use.  We have sent the following medications to your pharmacy for you to pick up at your convenience: Lialda 2 tablets twice daily (increased from 2 tablets once daily) Bentyl 10 mg four times daily as needed Amitriptyline 25 mg every night (plesae discontinue Norco AND tramadol while taking this medication)  We have given you orders to have the following labwork at your workplace (per your request): Sed Rate, high sensitivity CRP  Please discontinue Florastor and instead, try VSL #3 --- 2 capsules by mouth daily (this can be purchased at [facility] for $48.00/60 capsules)  Please follow up with Dr Hilarie Fredrickson in 3 months.

## 2014-07-30 ENCOUNTER — Telehealth: Payer: Self-pay | Admitting: *Deleted

## 2014-07-30 NOTE — Telephone Encounter (Signed)
-----  Message from Larina Bras, [place] sent at 07/23/2014  3:58 PM EDT ----- Did pt get esr and crp done at work? See 07/15/14 office note

## 2014-07-30 NOTE — Telephone Encounter (Signed)
Left message for patient to call back or send labwork to our fax.

## 2014-08-02 ENCOUNTER — Encounter: Payer: Self-pay | Admitting: Internal Medicine

## 2014-08-03 NOTE — Telephone Encounter (Signed)
Labs received and placed on Dr Vena Rua desk for review.

## 2014-10-21 ENCOUNTER — Other Ambulatory Visit: Payer: Self-pay | Admitting: Internal Medicine

## 2014-10-25 ENCOUNTER — Other Ambulatory Visit: Payer: Self-pay | Admitting: Internal Medicine

## 2014-12-27 ENCOUNTER — Encounter: Payer: Self-pay | Admitting: Internal Medicine

## 2015-01-04 NOTE — Telephone Encounter (Signed)
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

## 2015-01-07 ENCOUNTER — Telehealth: Payer: Self-pay | Admitting: Internal Medicine

## 2015-01-07 NOTE — Telephone Encounter (Signed)
Pt aware of appt. Wanted to let Dr. Hilarie Fredrickson know that she is still having issues with abdominal pain and diarrhea. Pt cannot afford the xifaxan it was going to be over $3000. Pt states she is still taking the lialda and antispasmodics. Dr. Hilarie Fredrickson aware and pt will keep OV with Amy next week.

## 2015-01-07 NOTE — Telephone Encounter (Signed)
Left message for pt to call back. Scheduled pt to see Nicoletta Ba PA 01/13/15@3pm .

## 2015-01-13 ENCOUNTER — Telehealth: Payer: Self-pay | Admitting: Physician Assistant

## 2015-01-13 ENCOUNTER — Ambulatory Visit: Payer: PRIVATE HEALTH INSURANCE | Admitting: Physician Assistant

## 2015-01-13 NOTE — Telephone Encounter (Signed)
Do not charge cancellation fee. 

## 2015-02-10 ENCOUNTER — Encounter: Payer: Self-pay | Admitting: *Deleted

## 2015-02-11 ENCOUNTER — Ambulatory Visit: Payer: PRIVATE HEALTH INSURANCE | Admitting: Internal Medicine

## 2015-02-23 ENCOUNTER — Ambulatory Visit: Payer: PRIVATE HEALTH INSURANCE | Admitting: Internal Medicine

## 2015-03-03 DIAGNOSIS — M5136 Other intervertebral disc degeneration, lumbar region: Secondary | ICD-10-CM | POA: Diagnosis present

## 2015-03-03 DIAGNOSIS — F901 Attention-deficit hyperactivity disorder, predominantly hyperactive type: Secondary | ICD-10-CM | POA: Diagnosis present

## 2015-03-03 DIAGNOSIS — T783XXA Angioneurotic edema, initial encounter: Secondary | ICD-10-CM | POA: Insufficient documentation

## 2015-03-03 DIAGNOSIS — K5790 Diverticulosis of intestine, part unspecified, without perforation or abscess without bleeding: Secondary | ICD-10-CM | POA: Insufficient documentation

## 2015-03-03 DIAGNOSIS — Z79899 Other long term (current) drug therapy: Secondary | ICD-10-CM | POA: Insufficient documentation

## 2015-07-11 ENCOUNTER — Other Ambulatory Visit: Payer: Self-pay | Admitting: Internal Medicine

## 2015-09-07 DIAGNOSIS — Z87442 Personal history of urinary calculi: Secondary | ICD-10-CM | POA: Insufficient documentation

## 2015-11-27 IMAGING — CT CT RENAL STONE PROTOCOL
1 series · 14 of 32 positions shown, 18 images · non-contrast
Comparison: CT of the abdomen and pelvis July 23, 2013

CLINICAL DATA: Left flank pain, history of kidney stones.

EXAM:
CT RENAL STONE PROTOCOL
TECHNIQUE: Multidetector CT imaging of the abdomen and pelvis was performed
following the standard protocol without intravenous contrast

[Series 6: lung · axial · 0.83mm/px · z∈[+1258,+1398]mm · 14 of 33 slices shown, 18 images]
[im 3/33  soft-tissue]
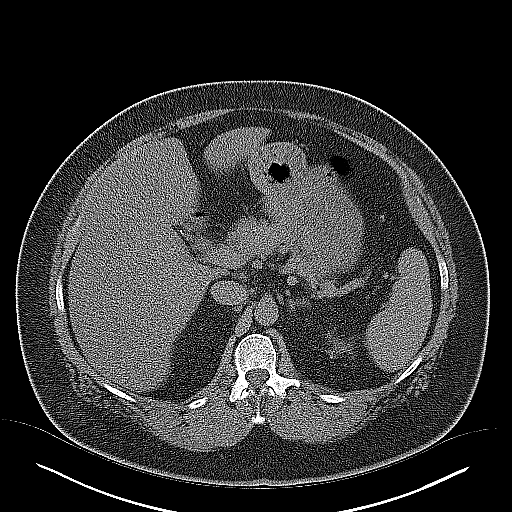
[im 3/33  bone]
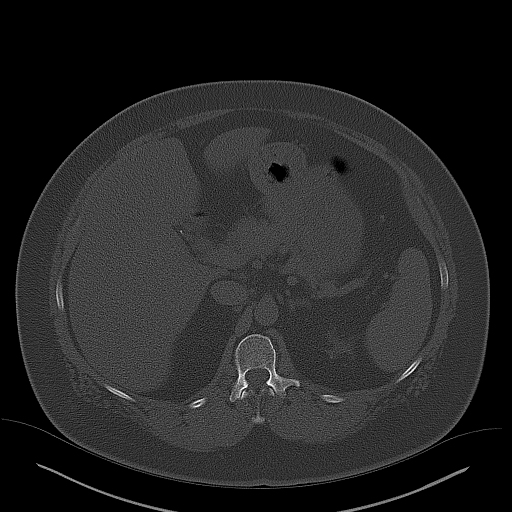
[im 5/33  soft-tissue]
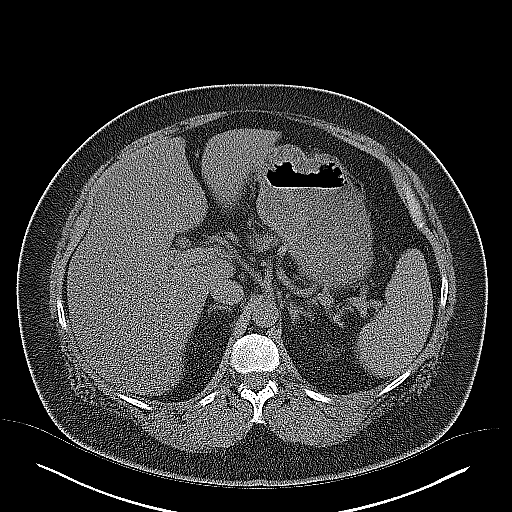
[im 8/33  soft-tissue]
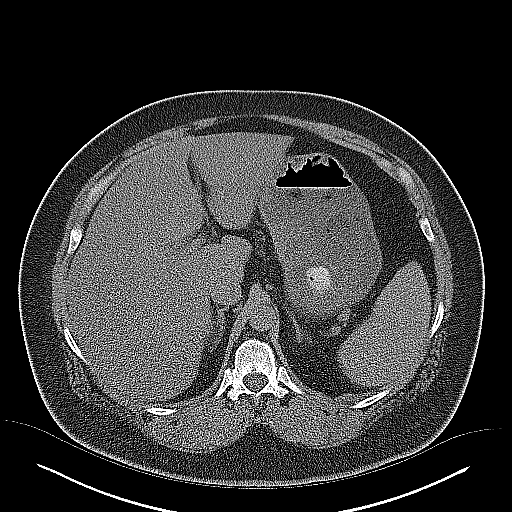
[im 10/33  soft-tissue]
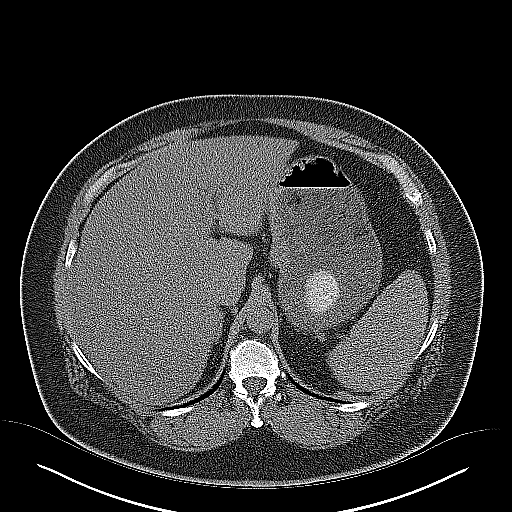
[im 13/33  soft-tissue]
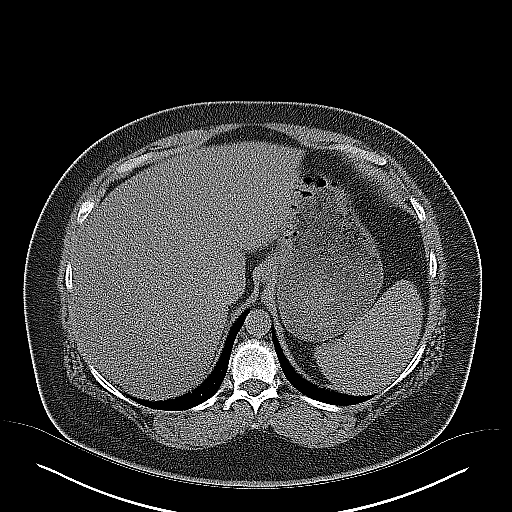
[im 15/33  soft-tissue]
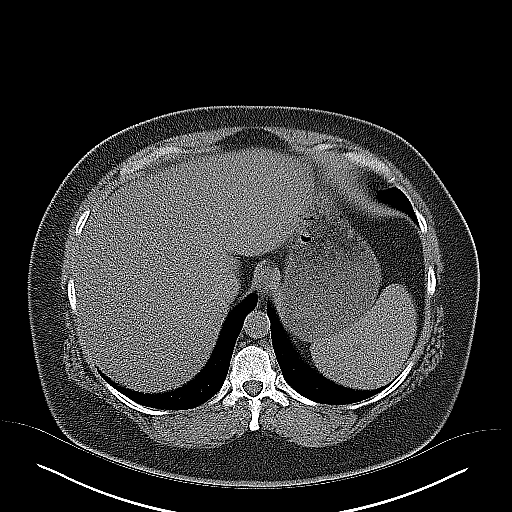
[im 18/33  soft-tissue]
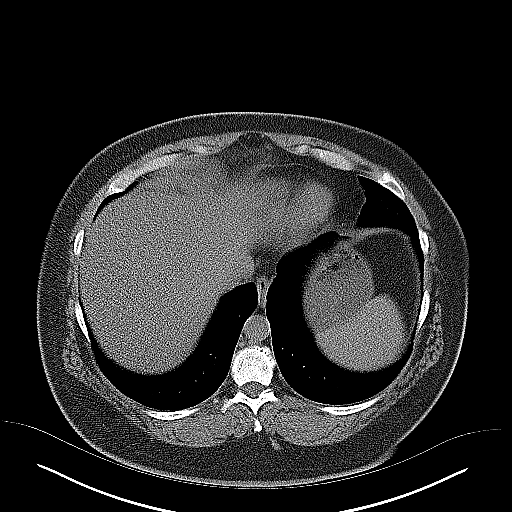
[im 20/33  soft-tissue]
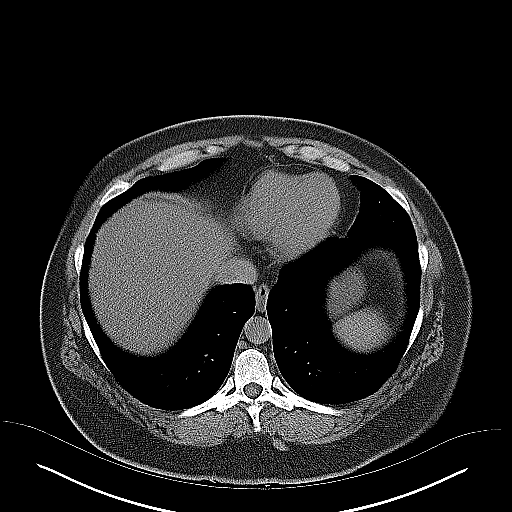
[im 23/33  soft-tissue]
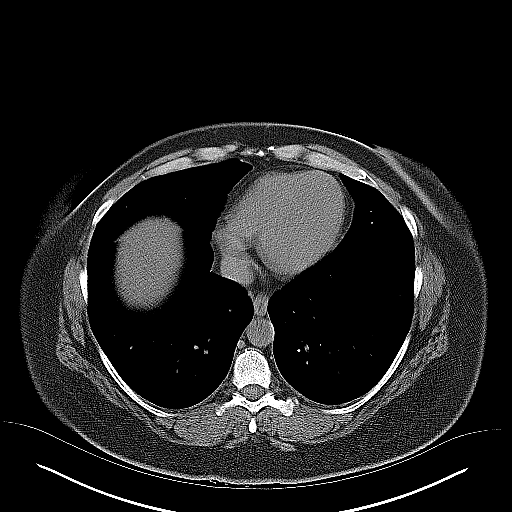
[im 23/33  bone]
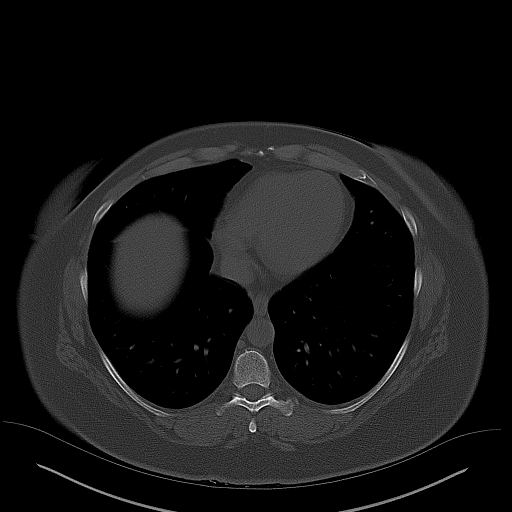
[im 25/33  soft-tissue]
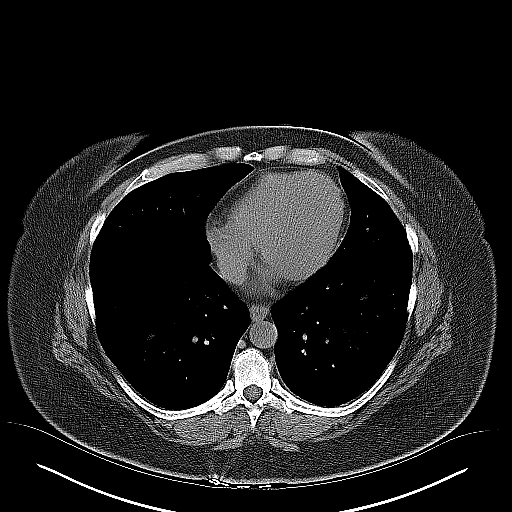
[im 28/33  soft-tissue]
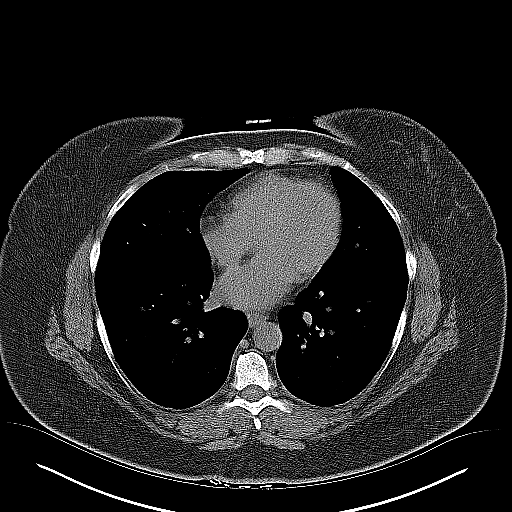
[im 28/33  lung]
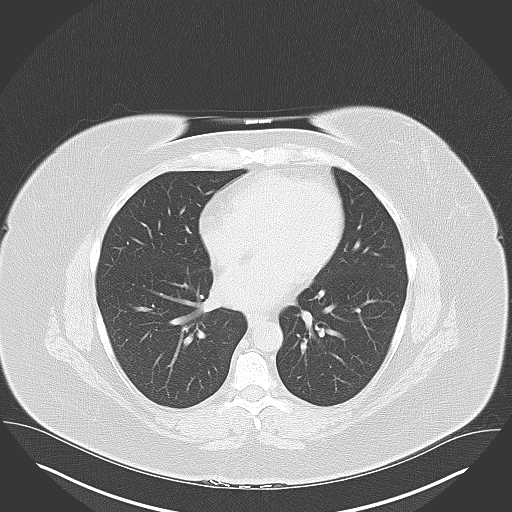
[im 29/33  lung]
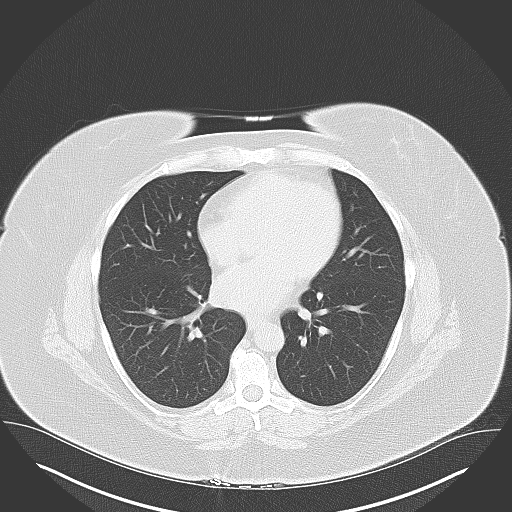
[im 30/33  soft-tissue]
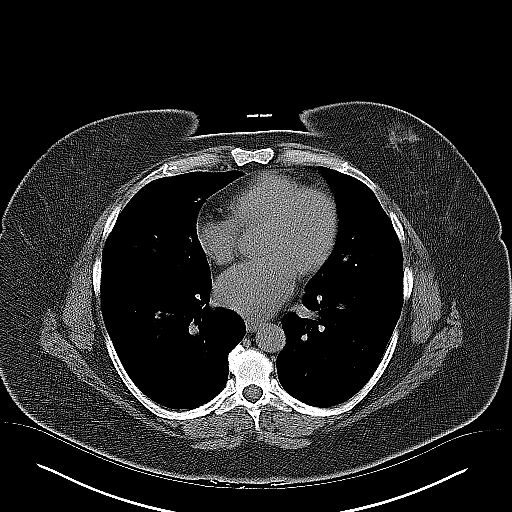
[im 30/33  lung]
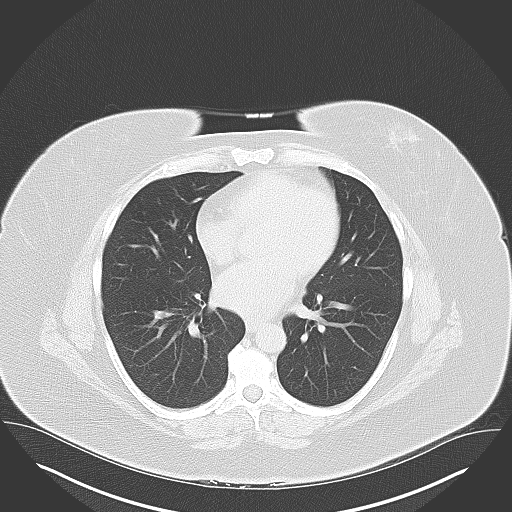
[im 31/33  lung]
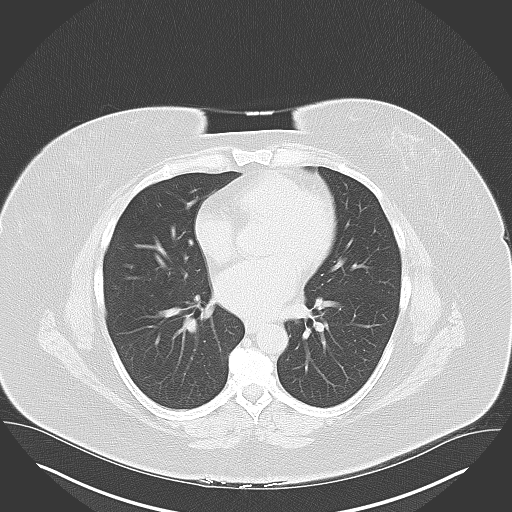

[14 of 32 positions shown; findings below may reference images not displayed]

FINDINGS: LUNG BASES: Included view of the lung bases are clear. The
visualized heart and pericardium are unremarkable.

KIDNEYS/BLADDER: Kidneys are orthotopic, demonstrating normal size
and morphology. Moderate left hydroureteronephrosis to the level of
the distal ureter were 2 calculi are seen measuring up to 3 mm. No
residual left nephrolithiasis. Two 2 mm right interpolar
nephrolithiasis. Left perinephric an ureteral stranding. Urinary
bladder is partially distended unremarkable.

SOLID ORGANS: The spleen, pancreas and adrenal glands are
unremarkable for this non-contrast examination. The liver is
diffusely hypodense most consistent with fatty infiltration. Status
post cholecystectomy.

GASTROINTESTINAL TRACT: The stomach, small bowel are normal in
course and caliber without inflammatory changes, the sensitivity may
be decreased by lack of enteric contrast. Surgical anastomosis at
the level of the sigmoid colon. Colonic diverticulosis. Normal
appendix.

PERITONEUM/RETROPERITONEUM: No intraperitoneal free fluid nor free
air. Aortoiliac vessels are normal in course and caliber. No
lymphadenopathy by CT size criteria. Internal reproductive organs
are unremarkable.

SOFT TISSUES/ OSSEOUS STRUCTURES: 2.5 cm fluid collection within the
subcutaneous fat immediately above the umbilicus with surrounding
inflammation (sagittal 63/127). No subcutaneous gas or radiopaque
foreign bodies. The grafts abdominal wall scarring. Subcentimeter
presumed bone island left sacrum.
IMPRESSION: Moderate left hydroureteronephrosis to the level of the distal
ureter were to urolithiasis are seen measuring up to 3 mm. No
residual left nephrolithiasis. Left perinephric stranding could
reflect calyceal rupture is superimposed urinary tract infection.

Two nonobstructing right 2 mm interpolar nephrolithiasis.

2.5 cm fluid collection within the periumbilical subcutaneous fat,
which could be infectious or posttraumatic, recommend direct
inspection.

Fatty liver, status post cholecystectomy.

  By: Ebadat Tiger

## 2016-06-22 DIAGNOSIS — R413 Other amnesia: Secondary | ICD-10-CM | POA: Insufficient documentation

## 2017-02-07 DIAGNOSIS — F419 Anxiety disorder, unspecified: Secondary | ICD-10-CM | POA: Diagnosis present

## 2017-02-25 DIAGNOSIS — H16223 Keratoconjunctivitis sicca, not specified as Sjogren's, bilateral: Secondary | ICD-10-CM | POA: Insufficient documentation

## 2020-02-17 ENCOUNTER — Ambulatory Visit
Admission: RE | Admit: 2020-02-17 | Discharge: 2020-02-17 | Disposition: A | Discharge status: Home / Self Care | Payer: Non-veteran care | Source: Ambulatory Visit | Attending: Emergency Medicine | Admitting: Emergency Medicine

## 2020-02-17 ENCOUNTER — Other Ambulatory Visit: Payer: Self-pay

## 2020-02-17 VITALS — BP 148/94 | HR 71 | Temp 99.2°F | Resp 20

## 2020-02-17 DIAGNOSIS — M25562 Pain in left knee: Secondary | ICD-10-CM | POA: Diagnosis not present

## 2020-02-17 MED ORDER — PREDNISONE 10 MG (21) PO TBPK
ORAL_TABLET | Freq: Every day | ORAL | 0 refills | Status: DC
Start: 2020-02-17 — End: 2020-03-11

## 2020-02-17 NOTE — ED Triage Notes (Signed)
Patient states she has had pain x 3 days and had acl surgery several years ago in that knee. Pt states she has had a previous similar episode several months ago that resolved with rest. Pt is aox4 and ambulatory.

## 2020-02-17 NOTE — ED Provider Notes (Signed)
[facility]    CSN: [id number] Arrival date & time: 02/17/20  1451      History   Chief Complaint Chief Complaint  Patient presents with  . Knee Pain    x 3 days    HPI [name] is a 54 y.o. female  Presenting for left knee pain.  Has history thereof: S/p ACL surgery several years ago.  Has not been seen by her orthopedic provider "in a while ".  States has been worse the last 3 days as she has been using it more.  Denies injury, crepitus.  Pain is worse with weightbearing.  Past Medical History:  Diagnosis Date  . ADHD (attention deficit hyperactivity disorder)   . ADHD (attention deficit hyperactivity disorder)   . C. difficile diarrhea   . Chronic back pain   . Depression   . Difficulty sleeping   . Diverticulosis   . GERD (gastroesophageal reflux disease)   . Hiatal hernia   . Nephrolithiasis   . Palpitations   . PONV (postoperative nausea and vomiting)   . Psoriasis   . Right ureteral stone 09/14/2012  . Tubular adenoma    colonoscopy 03/2013  . UTI (lower urinary tract infection)     Patient Active Problem List   Diagnosis Date Noted  . Other malaise and fatigue 07/29/2013  . Depression 07/29/2013  . GERD (gastroesophageal reflux disease) 03/31/2013  . History of left hemicolectomy 02/10/2013  . History of Clostridium difficile colitis 02/10/2013  . Left sided abdominal pain 02/10/2013  . Renal stones 02/10/2013  . History of diverticulitis of colon 02/10/2013  . Unspecified constipation 01/07/2013  . Right ureteral stone 09/14/2012    Past Surgical History:  Procedure Laterality Date  . ANTERIOR CRUCIATE LIGAMENT REPAIR    . ANTERIOR CRUCIATE LIGAMENT REPAIR    . CHOLECYSTECTOMY    . COLONOSCOPY  03/2013  . CYSTOSCOPY WITH RETROGRADE PYELOGRAM, URETEROSCOPY AND STENT PLACEMENT    . CYSTOSCOPY WITH RETROGRADE PYELOGRAM, URETEROSCOPY AND STENT PLACEMENT    . CYSTOSCOPY WITH RETROGRADE PYELOGRAM, URETEROSCOPY AND STENT PLACEMENT     . CYSTOSCOPY WITH URETEROSCOPY N/A 09/14/2012   Procedure: CYSTOSCOPY WITH URETEROSCOPY, with  stent placement;  Surgeon: Malka So, MD;  Location: [facility];  Service: Urology;  Laterality: N/A;  . CYSTOSCOPY/RETROGRADE/URETEROSCOPY/STONE EXTRACTION WITH BASKET Left 11/10/2013   Procedure: LEFT URETEROSCOPY, CYSTOSCOPY;  Surgeon: Malka So, MD;  Location: [facility];  Service: Urology;  Laterality: Left;  . PARTIAL COLECTOMY    . WRIST SURGERY      OB History    Gravida  2   Para  2   Term  2   Preterm      AB      Living        SAB      TAB      Ectopic      Multiple      Live Births               Home Medications    Prior to Admission medications   Medication Sig Start Date End Date Taking? Authorizing Provider  acetaminophen (TYLENOL) 500 MG tablet Take 1,000 mg by mouth every 6 (six) hours as needed for moderate pain.    [provider]  albuterol (PROVENTIL HFA;VENTOLIN HFA) 108 (90 BASE) MCG/ACT inhaler Inhale 2 puffs into the lungs every 4 (four) hours as needed (for bronchitis).    [provider]  amitriptyline ([name]) 25 MG tablet  Take 1 tablet (25 mg total) by mouth at bedtime. 07/15/14   Pyrtle, Lajuan Lines, MD  amphetamine-dextroamphetamine (ADDERALL XR) 20 MG 24 hr capsule Take 20 mg by mouth every morning.     [provider]  Coenzyme Q10 (CO Q 10 PO) Take 1 tablet by mouth at bedtime.    [provider]  dicyclomine (BENTYL) 10 MG capsule Take 1 capsule (10 mg total) by mouth 4 (four) times daily as needed for spasms. 07/15/14   Pyrtle, Lajuan Lines, MD  diphenhydrAMINE (BENADRYL) 25 mg capsule Take 25 mg by mouth every 6 (six) hours as needed for itching.    [provider]  hydrocortisone valerate cream (WESTCORT) 0.2 % Apply 1 application topically 4 (four) times daily as needed (for eczema/psoriasis on face).    [provider]  mesalamine (LIALDA) 1.2 G EC tablet Take 2 tablets (2.4 g total) by mouth 2 (two)  times daily. 07/15/14   Pyrtle, Lajuan Lines, MD  metoprolol tartrate (LOPRESSOR) 25 MG tablet Take 25 mg by mouth 2 (two) times daily.    [provider]  Multiple Vitamin (MULTIVITAMIN WITH MINERALS) TABS tablet Take 1 tablet by mouth daily.    [provider]  omeprazole (PRILOSEC) 40 MG capsule Take 40 mg by mouth 2 (two) times daily.    [provider]  ondansetron (ZOFRAN) 8 MG tablet Take 1 tablet (8 mg total) by mouth 2 (two) times daily as needed for nausea or vomiting. 12/16/13   Pyrtle, Lajuan Lines, MD  polyethylene glycol (MIRALAX / GLYCOLAX) packet Take 17 g by mouth as needed.     [provider]  predniSONE (STERAPRED UNI-PAK 21 TAB) 10 MG (21) TBPK tablet Take by mouth daily. Take steroid taper as written 02/17/20   Hall-Potvin, [place], PA-C  Probiotic Product (VSL#3) CAPS Take 2 capsules by mouth daily. 07/15/14   Pyrtle, Lajuan Lines, MD  promethazine (PHENERGAN) 25 MG tablet Take 1 tablet (25 mg total) by mouth every 6 (six) hours as needed for nausea or vomiting. 12/16/13   Pyrtle, Lajuan Lines, MD  rifaximin (XIFAXAN) 550 MG TABS tablet Take 1 tablet (550 mg total) by mouth 3 (three) times daily. 06/28/14   Pyrtle, Lajuan Lines, MD  traMADol (ULTRAM) 50 MG tablet Take 1 tablet (50 mg total) by mouth 3 (three) times daily as needed. 12/16/13   Pyrtle, Lajuan Lines, MD  Vitamin E 400 UNITS TABS Take 2 tablets (800 Units total) by mouth daily. 04/21/14   Pyrtle, Lajuan Lines, MD  zolpidem (AMBIEN) 10 MG tablet Take 10 mg by mouth at bedtime as needed for sleep.    [provider]    Family History Family History  Problem Relation Age of Onset  . Osteoarthritis Mother   . Arthritis Mother   . Asthma Mother   . Osteoarthritis Maternal Grandmother   . Osteoarthritis Maternal Grandfather   . ADD / ADHD Son   . ADD / ADHD Daughter   . Colon cancer Neg Hx     Social History Social History   Tobacco Use  . Smoking status: Never Smoker  . Smokeless tobacco: Never Used  Vaping Use  .  Vaping Use: Never used  Substance Use Topics  . Alcohol use: Yes    Alcohol/week: 0.0 - 1.0 standard drinks    Comment: rarely  . Drug use: No     Allergies   Sulfa antibiotics, Ciprofloxacin, Flagyl [metronidazole], Ivp dye [iodinated diagnostic agents], Other, and Metoclopramide  Review of Systems Review of Systems  Constitutional: Negative for fatigue and fever.  HENT: Negative for ear pain, sinus pain, sore throat and voice change.   Eyes: Negative for pain, redness and visual disturbance.  Respiratory: Negative for cough and shortness of breath.   Cardiovascular: Negative for chest pain and palpitations.  Gastrointestinal: Negative for abdominal pain, diarrhea and vomiting.  Musculoskeletal: Positive for gait problem. Negative for arthralgias and myalgias.  Skin: Negative for rash and wound.  Neurological: Negative for syncope and headaches.     Physical Exam Triage Vital Signs ED Triage Vitals  Enc Vitals Group     BP 02/17/20 1525 (!) 148/94     Pulse Rate 02/17/20 1525 71     Resp 02/17/20 1525 20     Temp 02/17/20 1525 99.2 F (37.3 C)     Temp Source 02/17/20 1525 Oral     SpO2 02/17/20 1525 96 %     Weight --      Height --      Head Circumference --      Peak Flow --      Pain Score 02/17/20 1535 2     Pain Loc --      Pain Edu? --      Excl. in [place]? --    No data found.  Updated Vital Signs BP (!) 148/94 (BP Location: Right Arm)   Pulse 71   Temp 99.2 F (37.3 C) (Oral)   Resp 20   SpO2 96%   Visual Acuity Right Eye Distance:   Left Eye Distance:   Bilateral Distance:    Right Eye Near:   Left Eye Near:    Bilateral Near:     Physical Exam Constitutional:      General: She is not in acute distress. HENT:     Head: Normocephalic and atraumatic.  Eyes:     General: No scleral icterus.    Pupils: Pupils are equal, round, and reactive to light.  Cardiovascular:     Rate and Rhythm: Normal rate.  Pulmonary:     Effort: Pulmonary  effort is normal.  Musculoskeletal:        General: Tenderness present. No swelling. Normal range of motion.     Comments: Left knee, medial.  Patella stable to valgus and varus stress.  Negative anterior drawer test.  NVI  Skin:    General: Skin is warm.     Capillary Refill: Capillary refill takes less than 2 seconds.     Coloration: Skin is not jaundiced or pale.     Findings: No bruising.  Neurological:     Mental Status: She is alert and oriented to person, place, and time.      UC Treatments / Results  Labs (all labs ordered are listed, but only abnormal results are displayed) Labs Reviewed - No data to display  EKG   Radiology No results found.  Procedures Procedures (including critical care time)  Medications Ordered in UC Medications - No data to display  Initial Impression / Assessment and Plan / UC Course  I have reviewed the triage vital signs and the nursing notes.  Pertinent labs & imaging results that were available during my care of the patient were reviewed by me and considered in my medical decision making (see chart for details).     Patient without injury as well as known history of knee pain: Radiography deferred at this time.  Applied Ace wrap in office which he tolerated well.  Offered crutches, though none available on site.  Patient will follow up with Ortho this week.  Return precautions discussed, pt verbalized understanding and is agreeable to plan. Final Clinical Impressions(s) / UC Diagnoses   Final diagnoses:  Acute pain of left knee     Discharge Instructions     Prednisone with breakfast: 6-5-4-3-2-1    ED Prescriptions    Medication Sig Dispense Auth. Provider   predniSONE (STERAPRED UNI-PAK 21 TAB) 10 MG (21) TBPK tablet Take by mouth daily. Take steroid taper as written 21 tablet Hall-Potvin, [place], PA-C     I have reviewed the PDMP during this encounter.   Hall-Potvin, [place], [place] 02/17/20 1732

## 2020-02-17 NOTE — Discharge Instructions (Addendum)
Prednisone with breakfast: 6-5-4-3-2-1

## 2020-03-06 ENCOUNTER — Emergency Department (HOSPITAL_COMMUNITY): Payer: No Typology Code available for payment source

## 2020-03-06 ENCOUNTER — Other Ambulatory Visit: Payer: Self-pay

## 2020-03-06 ENCOUNTER — Inpatient Hospital Stay (HOSPITAL_COMMUNITY)
Admission: EM | Admit: 2020-03-06 | Discharge: 2020-03-11 | DRG: 872 | Disposition: A | Discharge status: Home / Self Care | Payer: No Typology Code available for payment source | Attending: Family Medicine | Admitting: Family Medicine

## 2020-03-06 ENCOUNTER — Encounter (HOSPITAL_COMMUNITY): Payer: Self-pay | Admitting: Emergency Medicine

## 2020-03-06 DIAGNOSIS — Z79899 Other long term (current) drug therapy: Secondary | ICD-10-CM

## 2020-03-06 DIAGNOSIS — A4151 Sepsis due to Escherichia coli [E. coli]: Secondary | ICD-10-CM | POA: Diagnosis not present

## 2020-03-06 DIAGNOSIS — F410 Panic disorder [episodic paroxysmal anxiety] without agoraphobia: Secondary | ICD-10-CM | POA: Diagnosis present

## 2020-03-06 DIAGNOSIS — N12 Tubulo-interstitial nephritis, not specified as acute or chronic: Secondary | ICD-10-CM | POA: Diagnosis present

## 2020-03-06 DIAGNOSIS — E119 Type 2 diabetes mellitus without complications: Secondary | ICD-10-CM

## 2020-03-06 DIAGNOSIS — G44209 Tension-type headache, unspecified, not intractable: Secondary | ICD-10-CM | POA: Diagnosis present

## 2020-03-06 DIAGNOSIS — E1165 Type 2 diabetes mellitus with hyperglycemia: Secondary | ICD-10-CM | POA: Diagnosis present

## 2020-03-06 DIAGNOSIS — Z888 Allergy status to other drugs, medicaments and biological substances status: Secondary | ICD-10-CM

## 2020-03-06 DIAGNOSIS — I10 Essential (primary) hypertension: Secondary | ICD-10-CM | POA: Diagnosis present

## 2020-03-06 DIAGNOSIS — Z23 Encounter for immunization: Secondary | ICD-10-CM

## 2020-03-06 DIAGNOSIS — G8929 Other chronic pain: Secondary | ICD-10-CM | POA: Diagnosis present

## 2020-03-06 DIAGNOSIS — Z9049 Acquired absence of other specified parts of digestive tract: Secondary | ICD-10-CM

## 2020-03-06 DIAGNOSIS — Z882 Allergy status to sulfonamides status: Secondary | ICD-10-CM

## 2020-03-06 DIAGNOSIS — N390 Urinary tract infection, site not specified: Secondary | ICD-10-CM | POA: Diagnosis present

## 2020-03-06 DIAGNOSIS — E669 Obesity, unspecified: Secondary | ICD-10-CM | POA: Diagnosis present

## 2020-03-06 DIAGNOSIS — Z6834 Body mass index (BMI) 34.0-34.9, adult: Secondary | ICD-10-CM

## 2020-03-06 DIAGNOSIS — N179 Acute kidney failure, unspecified: Secondary | ICD-10-CM | POA: Diagnosis present

## 2020-03-06 DIAGNOSIS — Z825 Family history of asthma and other chronic lower respiratory diseases: Secondary | ICD-10-CM

## 2020-03-06 DIAGNOSIS — Z7984 Long term (current) use of oral hypoglycemic drugs: Secondary | ICD-10-CM

## 2020-03-06 DIAGNOSIS — Z20822 Contact with and (suspected) exposure to covid-19: Secondary | ICD-10-CM | POA: Diagnosis present

## 2020-03-06 DIAGNOSIS — F909 Attention-deficit hyperactivity disorder, unspecified type: Secondary | ICD-10-CM | POA: Diagnosis present

## 2020-03-06 DIAGNOSIS — Z87442 Personal history of urinary calculi: Secondary | ICD-10-CM

## 2020-03-06 DIAGNOSIS — M545 Low back pain, unspecified: Secondary | ICD-10-CM | POA: Diagnosis present

## 2020-03-06 DIAGNOSIS — Z881 Allergy status to other antibiotic agents status: Secondary | ICD-10-CM

## 2020-03-06 DIAGNOSIS — Z8261 Family history of arthritis: Secondary | ICD-10-CM

## 2020-03-06 DIAGNOSIS — G4733 Obstructive sleep apnea (adult) (pediatric): Secondary | ICD-10-CM | POA: Diagnosis present

## 2020-03-06 DIAGNOSIS — R109 Unspecified abdominal pain: Secondary | ICD-10-CM | POA: Diagnosis present

## 2020-03-06 DIAGNOSIS — L409 Psoriasis, unspecified: Secondary | ICD-10-CM | POA: Diagnosis present

## 2020-03-06 DIAGNOSIS — A419 Sepsis, unspecified organism: Secondary | ICD-10-CM | POA: Diagnosis present

## 2020-03-06 DIAGNOSIS — K589 Irritable bowel syndrome without diarrhea: Secondary | ICD-10-CM | POA: Diagnosis present

## 2020-03-06 DIAGNOSIS — K5792 Diverticulitis of intestine, part unspecified, without perforation or abscess without bleeding: Secondary | ICD-10-CM | POA: Diagnosis present

## 2020-03-06 DIAGNOSIS — Z91041 Radiographic dye allergy status: Secondary | ICD-10-CM

## 2020-03-06 DIAGNOSIS — R000 Tachycardia, unspecified: Secondary | ICD-10-CM | POA: Diagnosis present

## 2020-03-06 DIAGNOSIS — Z885 Allergy status to narcotic agent status: Secondary | ICD-10-CM

## 2020-03-06 DIAGNOSIS — Z791 Long term (current) use of non-steroidal anti-inflammatories (NSAID): Secondary | ICD-10-CM

## 2020-03-06 DIAGNOSIS — K219 Gastro-esophageal reflux disease without esophagitis: Secondary | ICD-10-CM | POA: Diagnosis present

## 2020-03-06 DIAGNOSIS — F32A Depression, unspecified: Secondary | ICD-10-CM | POA: Diagnosis present

## 2020-03-06 LAB — LACTIC ACID, PLASMA: Lactic Acid, Venous: 1.6 mmol/L (ref 0.5–1.9)

## 2020-03-06 LAB — COMPREHENSIVE METABOLIC PANEL
ALT: 38 U/L (ref 0–44)
AST: 25 U/L (ref 15–41)
Albumin: 3.9 g/dL (ref 3.5–5.0)
Alkaline Phosphatase: 97 U/L (ref 38–126)
Anion gap: 15 (ref 5–15)
BUN: 24 mg/dL — ABNORMAL HIGH (ref 6–20)
CO2: 22 mmol/L (ref 22–32)
Calcium: 9.7 mg/dL (ref 8.9–10.3)
Chloride: 94 mmol/L — ABNORMAL LOW (ref 98–111)
Creatinine, Ser: 1.22 mg/dL — ABNORMAL HIGH (ref 0.44–1.00)
GFR, Estimated: 53 mL/min — ABNORMAL LOW (ref >60)
Glucose, Bld: 160 mg/dL — ABNORMAL HIGH (ref 70–99)
Potassium: 4.5 mmol/L (ref 3.5–5.1)
Sodium: 131 mmol/L — ABNORMAL LOW (ref 135–145)
Total Bilirubin: 0.7 mg/dL (ref 0.3–1.2)
Total Protein: 8.1 g/dL (ref 6.5–8.1)

## 2020-03-06 LAB — CBC WITH DIFFERENTIAL/PLATELET
Abs Immature Granulocytes: 0.07 10*3/uL (ref 0.00–0.07)
Basophils Absolute: 0.1 10*3/uL (ref 0.0–0.1)
Basophils Relative: 1 %
Eosinophils Absolute: 0 10*3/uL (ref 0.0–0.5)
Eosinophils Relative: 0 %
HCT: 46.3 % — ABNORMAL HIGH (ref 36.0–46.0)
Hemoglobin: 15.7 g/dL — ABNORMAL HIGH (ref 12.0–15.0)
Immature Granulocytes: 1 %
Lymphocytes Relative: 8 %
Lymphs Abs: 1 10*3/uL (ref 0.7–4.0)
MCH: 30.2 pg (ref 26.0–34.0)
MCHC: 33.9 g/dL (ref 30.0–36.0)
MCV: 89 fL (ref 80.0–100.0)
Monocytes Absolute: 1.4 10*3/uL — ABNORMAL HIGH (ref 0.1–1.0)
Monocytes Relative: 10 %
Neutro Abs: 11.2 10*3/uL — ABNORMAL HIGH (ref 1.7–7.7)
Neutrophils Relative %: 80 %
Platelets: 185 10*3/uL (ref 150–400)
RBC: 5.2 MIL/uL — ABNORMAL HIGH (ref 3.87–5.11)
RDW: 13 % (ref 11.5–15.5)
WBC: 13.8 10*3/uL — ABNORMAL HIGH (ref 4.0–10.5)
nRBC: 0 % (ref 0.0–0.2)

## 2020-03-06 LAB — PROTIME-INR
INR: 1.1 (ref 0.8–1.2)
Prothrombin Time: 14.2 seconds (ref 11.4–15.2)

## 2020-03-06 MED ORDER — ACETAMINOPHEN 500 MG PO TABS
1000.0000 mg | ORAL_TABLET | Freq: Once | ORAL | Status: AC
  Administered 2020-03-06: 1000 mg via ORAL
  Filled 2020-03-06: qty 2

## 2020-03-06 MED ORDER — HYDROMORPHONE HCL 1 MG/ML IJ SOLN
0.5000 mg | Freq: Once | INTRAMUSCULAR | Status: AC
  Administered 2020-03-06: 0.5 mg via INTRAVENOUS
  Filled 2020-03-06: qty 1

## 2020-03-06 MED ORDER — LACTATED RINGERS IV BOLUS
1000.0000 mL | Freq: Once | INTRAVENOUS | Status: AC
  Administered 2020-03-06: 1000 mL via INTRAVENOUS

## 2020-03-06 MED ORDER — DIPHENHYDRAMINE HCL 50 MG/ML IJ SOLN
25.0000 mg | Freq: Once | INTRAMUSCULAR | Status: AC
  Administered 2020-03-06: 23:00:00 25 mg via INTRAVENOUS
  Filled 2020-03-06: qty 1

## 2020-03-06 MED ORDER — PROMETHAZINE HCL 25 MG/ML IJ SOLN
12.5000 mg | Freq: Once | INTRAMUSCULAR | Status: AC
  Administered 2020-03-06: 12.5 mg via INTRAVENOUS
  Filled 2020-03-06: qty 1

## 2020-03-06 NOTE — ED Triage Notes (Addendum)
Patient reports fever and body aches x1 week. Also c/o left flank pain since yesterday. Patient adds she has been alternating Tylenol and ibuprofen and is not due for her next dose until 2300.

## 2020-03-06 NOTE — ED Provider Notes (Signed)
[facility] DEPT Provider Note   CSN: [id number] Arrival date & time: 03/06/20  2211     History Chief Complaint  Patient presents with   Fever   Flank Pain    [name] is a 54 y.o. female.  HPI Patient is a 54 year old female with a medical history as noted below.  She presents today due to fevers.  Patient states she started experiencing her symptoms about 6 days ago.  Started with fevers, chills, body aches, nausea.  No vomiting.  Earlier today she began experiencing left-sided abdominal pain and left lower abdominal pain.  No diarrhea or hematochezia.  History of kidney stones.  Denies any dysuria, hematuria, frequency, difficulty urinating.  Patient is a Equities trader.  No chest pain, shortness of breath, URI sx. vaccinated for COVID-19 with the [employer] vaccine.  Not boosted.    Past Medical History:  Diagnosis Date   ADHD (attention deficit hyperactivity disorder)    ADHD (attention deficit hyperactivity disorder)    C. difficile diarrhea    Chronic back pain    Depression    Difficulty sleeping    Diverticulosis    GERD (gastroesophageal reflux disease)    Hiatal hernia    Nephrolithiasis    Palpitations    PONV (postoperative nausea and vomiting)    Psoriasis    Right ureteral stone 09/14/2012   Tubular adenoma    colonoscopy 03/2013   UTI (lower urinary tract infection)     Patient Active Problem List   Diagnosis Date Noted   Other malaise and fatigue 07/29/2013   Depression 07/29/2013   GERD (gastroesophageal reflux disease) 03/31/2013   History of left hemicolectomy 02/10/2013   History of Clostridium difficile colitis 02/10/2013   Left sided abdominal pain 02/10/2013   Renal stones 02/10/2013   History of diverticulitis of colon 02/10/2013   Unspecified constipation 01/07/2013   Right ureteral stone 09/14/2012    Past Surgical History:  Procedure Laterality Date    ANTERIOR CRUCIATE LIGAMENT REPAIR     ANTERIOR CRUCIATE LIGAMENT REPAIR     CHOLECYSTECTOMY     COLONOSCOPY  03/2013   CYSTOSCOPY WITH RETROGRADE PYELOGRAM, URETEROSCOPY AND STENT PLACEMENT     CYSTOSCOPY WITH RETROGRADE PYELOGRAM, URETEROSCOPY AND STENT PLACEMENT     CYSTOSCOPY WITH RETROGRADE PYELOGRAM, URETEROSCOPY AND STENT PLACEMENT     CYSTOSCOPY WITH URETEROSCOPY N/A 09/14/2012   Procedure: CYSTOSCOPY WITH URETEROSCOPY, with  stent placement;  Surgeon: Malka So, MD;  Location: [facility];  Service: Urology;  Laterality: N/A;   CYSTOSCOPY/RETROGRADE/URETEROSCOPY/STONE EXTRACTION WITH BASKET Left 11/10/2013   Procedure: LEFT URETEROSCOPY, CYSTOSCOPY;  Surgeon: Malka So, MD;  Location: [facility];  Service: Urology;  Laterality: Left;   PARTIAL COLECTOMY     WRIST SURGERY       OB History    Gravida  2   Para  2   Term  2   Preterm      AB      Living        SAB      IAB      Ectopic      Multiple      Live Births              Family History  Problem Relation Age of Onset   Osteoarthritis Mother    Arthritis Mother    Asthma Mother    Osteoarthritis Maternal Grandmother    Osteoarthritis Maternal Grandfather    ADD /  ADHD Son    ADD / ADHD Daughter    Colon cancer Neg Hx     Social History   Tobacco Use   Smoking status: Never Smoker   Smokeless tobacco: Never Used  Vaping Use   Vaping Use: Never used  Substance Use Topics   Alcohol use: Yes    Alcohol/week: 0.0 - 1.0 standard drinks    Comment: rarely   Drug use: No    Home Medications Prior to Admission medications   Medication Sig Start Date End Date Taking? Authorizing Provider  acetaminophen (TYLENOL) 500 MG tablet Take 1,000 mg by mouth every 6 (six) hours as needed for moderate pain.    [provider]  albuterol (PROVENTIL HFA;VENTOLIN HFA) 108 (90 BASE) MCG/ACT inhaler Inhale 2 puffs into the lungs every 4 (four) hours as needed (for bronchitis).     [provider]  amitriptyline ([name]) 25 MG tablet Take 1 tablet (25 mg total) by mouth at bedtime. 07/15/14   Pyrtle, Lajuan Lines, MD  amphetamine-dextroamphetamine (ADDERALL XR) 20 MG 24 hr capsule Take 20 mg by mouth every morning.     [provider]  Coenzyme Q10 (CO Q 10 PO) Take 1 tablet by mouth at bedtime.    [provider]  dicyclomine (BENTYL) 10 MG capsule Take 1 capsule (10 mg total) by mouth 4 (four) times daily as needed for spasms. 07/15/14   Pyrtle, Lajuan Lines, MD  diphenhydrAMINE (BENADRYL) 25 mg capsule Take 25 mg by mouth every 6 (six) hours as needed for itching.    [provider]  hydrocortisone valerate cream (WESTCORT) 0.2 % Apply 1 application topically 4 (four) times daily as needed (for eczema/psoriasis on face).    [provider]  mesalamine (LIALDA) 1.2 G EC tablet Take 2 tablets (2.4 g total) by mouth 2 (two) times daily. 07/15/14   Pyrtle, Lajuan Lines, MD  metoprolol tartrate (LOPRESSOR) 25 MG tablet Take 25 mg by mouth 2 (two) times daily.    [provider]  Multiple Vitamin (MULTIVITAMIN WITH MINERALS) TABS tablet Take 1 tablet by mouth daily.    [provider]  omeprazole (PRILOSEC) 40 MG capsule Take 40 mg by mouth 2 (two) times daily.    [provider]  ondansetron (ZOFRAN) 8 MG tablet Take 1 tablet (8 mg total) by mouth 2 (two) times daily as needed for nausea or vomiting. 12/16/13   Pyrtle, Lajuan Lines, MD  polyethylene glycol (MIRALAX / GLYCOLAX) packet Take 17 g by mouth as needed.     [provider]  predniSONE (STERAPRED UNI-PAK 21 TAB) 10 MG (21) TBPK tablet Take by mouth daily. Take steroid taper as written 02/17/20   Hall-Potvin, [place], PA-C  Probiotic Product (VSL#3) CAPS Take 2 capsules by mouth daily. 07/15/14   Pyrtle, Lajuan Lines, MD  promethazine (PHENERGAN) 25 MG tablet Take 1 tablet (25 mg total) by mouth every 6 (six) hours as needed for nausea or vomiting. 12/16/13   Pyrtle, Lajuan Lines, MD   rifaximin (XIFAXAN) 550 MG TABS tablet Take 1 tablet (550 mg total) by mouth 3 (three) times daily. 06/28/14   Pyrtle, Lajuan Lines, MD  traMADol (ULTRAM) 50 MG tablet Take 1 tablet (50 mg total) by mouth 3 (three) times daily as needed. 12/16/13   Pyrtle, Lajuan Lines, MD  Vitamin E 400 UNITS TABS Take 2 tablets (800 Units total) by mouth daily. 04/21/14   Pyrtle, Lajuan Lines, MD  zolpidem (AMBIEN) 10 MG tablet Take 10 mg  by mouth at bedtime as needed for sleep.    [provider]    Allergies    Sulfa antibiotics, Ciprofloxacin, Flagyl [metronidazole], Ivp dye [iodinated diagnostic agents], Other, and Metoclopramide  Review of Systems   Review of Systems  All other systems reviewed and are negative. Ten systems reviewed and are negative for acute change, except as noted in the HPI.   Physical Exam Updated Vital Signs BP (!) 123/91    Pulse (!) 103    Temp (!) 103.2 F (39.6 C) (Oral)    Resp 20    SpO2 97%   Physical Exam Vitals and nursing note reviewed.  Constitutional:      General: She is not in acute distress.    Appearance: Normal appearance. She is not ill-appearing, toxic-appearing or diaphoretic.  HENT:     Head: Normocephalic and atraumatic.     Right Ear: External ear normal.     Left Ear: External ear normal.     Nose: Nose normal.     Mouth/Throat:     Mouth: Mucous membranes are moist.     Pharynx: Oropharynx is clear. No oropharyngeal exudate or posterior oropharyngeal erythema.  Eyes:     General: No scleral icterus.       Right eye: No discharge.        Left eye: No discharge.     Extraocular Movements: Extraocular movements intact.     Conjunctiva/sclera: Conjunctivae normal.  Cardiovascular:     Rate and Rhythm: Regular rhythm. Tachycardia present.     Pulses: Normal pulses.     Heart sounds: Normal heart sounds. No murmur heard. No friction rub. No gallop.   Pulmonary:     Effort: Pulmonary effort is normal. No respiratory distress.     Breath sounds: Normal  breath sounds. No stridor. No wheezing, rhonchi or rales.  Abdominal:     General: Abdomen is flat.     Palpations: Abdomen is soft.     Tenderness: There is abdominal tenderness.     Comments: Moderate TTP noted to the central left lateral abdomen as well as the left lower quadrant.  No guarding.  Abdomen is soft.  No flank pain noted bilaterally with gentle fist strike.  Musculoskeletal:        General: Normal range of motion.     Cervical back: Normal range of motion and neck supple. No tenderness.  Skin:    General: Skin is warm and dry.  Neurological:     General: No focal deficit present.     Mental Status: She is alert and oriented to person, place, and time.  Psychiatric:        Mood and Affect: Mood normal.        Behavior: Behavior normal.    ED Results / Procedures / Treatments   Labs (all labs ordered are listed, but only abnormal results are displayed) Labs Reviewed  COMPREHENSIVE METABOLIC PANEL - Abnormal; Notable for the following components:      Result Value   Sodium 131 (*)    Chloride 94 (*)    Glucose, Bld 160 (*)    BUN 24 (*)    Creatinine, Ser 1.22 (*)    GFR, Estimated 53 (*)    All other components within normal limits  CBC WITH DIFFERENTIAL/PLATELET - Abnormal; Notable for the following components:   WBC 13.8 (*)    RBC 5.20 (*)    Hemoglobin 15.7 (*)    HCT 46.3 (*)  Neutro Abs 11.2 (*)    Monocytes Absolute 1.4 (*)    All other components within normal limits  URINALYSIS, ROUTINE W REFLEX MICROSCOPIC - Abnormal; Notable for the following components:   Ketones, ur 20 (*)    Protein, ur 100 (*)    Leukocytes,Ua LARGE (*)    WBC, UA >50 (*)    Bacteria, UA MANY (*)    All other components within normal limits  RESP PANEL BY RT-PCR (FLU A&B, COVID) ARPGX2  CULTURE, BLOOD (ROUTINE X 2)  CULTURE, BLOOD (ROUTINE X 2)  LACTIC ACID, PLASMA  PROTIME-INR  HCG, QUANTITATIVE, PREGNANCY   EKG None  Radiology CT ABDOMEN PELVIS WO  CONTRAST  Result Date: 03/07/2020 CLINICAL DATA:  Left flank pain. EXAM: CT ABDOMEN AND PELVIS WITHOUT CONTRAST TECHNIQUE: Multidetector CT imaging of the abdomen and pelvis was performed following the standard protocol without IV contrast. COMPARISON:  August 18, 2017 FINDINGS: Lower chest: No acute abnormality. Hepatobiliary: No focal liver abnormality is seen. Status post cholecystectomy. No biliary dilatation. Pancreas: Unremarkable. No pancreatic ductal dilatation or surrounding inflammatory changes. Spleen: Normal in size without focal abnormality. Adrenals/Urinary Tract: Adrenal glands are unremarkable. Kidneys are normal in size, without focal lesions. A 3 mm nonobstructing renal stone is seen within the mid to lower right kidney. Mild right-sided perinephric inflammatory fat stranding is seen. Bladder is unremarkable. Stomach/Bowel: Stomach is within normal limits. Appendix appears normal. Surgically anastomosed bowel is seen within the region of the mid sigmoid colon. No evidence of bowel wall dilatation. Noninflamed diverticula are seen within the descending and sigmoid colon. Vascular/Lymphatic: No significant vascular findings are present. No enlarged abdominal or pelvic lymph nodes. Reproductive: Uterus and bilateral adnexa are unremarkable. Other: No abdominal wall hernia or abnormality. No abdominopelvic ascites. Musculoskeletal: No acute or significant osseous findings. IMPRESSION: 1. 3 mm nonobstructing renal stone within the mid to lower right kidney. 2. Mild right-sided perinephric inflammatory fat stranding. Correlation with urinalysis is recommended to exclude the presence of an underlying infectious process. 3. Colonic diverticulosis. 4. Evidence of prior cholecystectomy. Electronically Signed   By: Virgina Norfolk M.D.   On: 03/07/2020 00:18   DG Chest 2 View  Result Date: 03/06/2020 CLINICAL DATA:  Suspected sepsis. EXAM: CHEST - 2 VIEW COMPARISON:  04/13/2016 FINDINGS: The heart size  and mediastinal contours are within normal limits. Both lungs are clear. The visualized skeletal structures are unremarkable. IMPRESSION: No active cardiopulmonary disease. Electronically Signed   By: Constance Holster M.D.   On: 03/06/2020 23:17    Procedures .Critical Care Performed by: Rayna Sexton, PA-C Authorized by: Rayna Sexton, PA-C   Critical care provider statement:    Critical care time (minutes):  30   Critical care was necessary to treat or prevent imminent or life-threatening deterioration of the following conditions:  Sepsis   Critical care was time spent personally by me on the following activities:  Discussions with consultants, evaluation of patient's response to treatment, examination of patient, ordering and performing treatments and interventions, ordering and review of laboratory studies, ordering and review of radiographic studies, pulse oximetry, re-evaluation of patient's condition, obtaining history from patient or surrogate and review of old charts   (including critical care time)  Medications Ordered in ED Medications  lactated ringers bolus 1,500 mL (1,500 mLs Intravenous New Bag/Given (Non-Interop) 03/07/20 0045)  cefTRIAXone (ROCEPHIN) 2 g in sodium chloride 0.9 % 100 mL IVPB (has no administration in time range)  lactated ringers bolus 1,000 mL (1,000 mLs Intravenous New Bag/Given (  Non-Interop) 03/06/20 2340)  acetaminophen (TYLENOL) tablet 1,000 mg (1,000 mg Oral Given 03/06/20 2342)  promethazine (PHENERGAN) injection 12.5 mg (12.5 mg Intravenous Given 03/06/20 2334)  diphenhydrAMINE (BENADRYL) injection 25 mg (25 mg Intravenous Given 03/06/20 2329)  HYDROmorphone (DILAUDID) injection 0.5 mg (0.5 mg Intravenous Given 03/06/20 2332)   ED Course  I have reviewed the triage vital signs and the nursing notes.  Pertinent labs & imaging results that were available during my care of the patient were reviewed by me and considered in my medical decision  making (see chart for details).  Clinical Course as of 03/07/20 0122  Sun Mar 06, 2020  2304 WBC(!): 13.8 [LJ]  2305 NEUT#(!): 11.2 [LJ]  2308 WBC(!): 13.8 [LJ]  2309 Temp(!): 103.2 F (39.6 C) [LJ]  2309 Pulse Rate(!): 103 [LJ]  2309 NEUT#(!): 11.2 [LJ]  2326 Creatinine(!): 1.22 [LJ]  2326 GFR, Estimated(!): 53 [LJ]  2326 DG Chest 2 View IMPRESSION: No active cardiopulmonary disease. [LJ]  2326 Lactic Acid, Venous: 1.6 [LJ]  Mon Mar 07, 2020  0107 Leukocytes,Ua(!): LARGE [LJ]  0108 Bacteria, UA(!): MANY [LJ]  0108 WBC, UA(!): >50 [LJ]  0110 SARS Coronavirus 2 by RT PCR: NEGATIVE [LJ]    Clinical Course User Index [LJ] Rayna Sexton, PA-C   MDM Rules/Calculators/A&P                          Patient presents today with fevers, chills, body aches for the past 6 days.  Started experiencing left-sided abdominal pain as well as left lower quadrant abdominal pain earlier today.  Patient found to be tachycardic as well as febrile upon arrival.  Repeat rectal temperature was found to be 104.7 Fahrenheit.  CBC with leukocytosis of 13.8.  UA showing large leukocytes, greater than 50 white blood cells, many bacteria.  CT of the abdomen showing mild right-sided perinephric inflammatory fat stranding.  Patient has no flank pain on my exam.  She denies any urinary complaints.  Code sepsis initiated.  Patient given 2.5 L of IV fluids.  Started on 2 g of IV Rocephin.  Dilaudid for pain.  Will admit to the hospitalist team for IV fluids, antibiotics, further management.  Respiratory panel has been obtained and is negative.  Final Clinical Impression(s) / ED Diagnoses Final diagnoses:  Sepsis, due to unspecified organism, unspecified whether acute organ dysfunction present [facility])  Pyelonephritis   Rx / DC Orders ED Discharge Orders    None       Rayna Sexton, PA-C 03/07/20 0453    Merrily Pew, MD 03/07/20 [phone]

## 2020-03-07 ENCOUNTER — Emergency Department (HOSPITAL_COMMUNITY): Payer: No Typology Code available for payment source

## 2020-03-07 ENCOUNTER — Observation Stay (HOSPITAL_COMMUNITY): Payer: No Typology Code available for payment source

## 2020-03-07 ENCOUNTER — Other Ambulatory Visit (HOSPITAL_COMMUNITY): Payer: PRIVATE HEALTH INSURANCE

## 2020-03-07 ENCOUNTER — Encounter (HOSPITAL_COMMUNITY): Payer: Self-pay | Admitting: Internal Medicine

## 2020-03-07 DIAGNOSIS — Z20822 Contact with and (suspected) exposure to covid-19: Secondary | ICD-10-CM | POA: Diagnosis present

## 2020-03-07 DIAGNOSIS — K219 Gastro-esophageal reflux disease without esophagitis: Secondary | ICD-10-CM | POA: Diagnosis not present

## 2020-03-07 DIAGNOSIS — M545 Low back pain, unspecified: Secondary | ICD-10-CM | POA: Diagnosis present

## 2020-03-07 DIAGNOSIS — G4733 Obstructive sleep apnea (adult) (pediatric): Secondary | ICD-10-CM | POA: Diagnosis present

## 2020-03-07 DIAGNOSIS — N390 Urinary tract infection, site not specified: Secondary | ICD-10-CM | POA: Diagnosis present

## 2020-03-07 DIAGNOSIS — E119 Type 2 diabetes mellitus without complications: Secondary | ICD-10-CM

## 2020-03-07 DIAGNOSIS — F410 Panic disorder [episodic paroxysmal anxiety] without agoraphobia: Secondary | ICD-10-CM | POA: Diagnosis present

## 2020-03-07 DIAGNOSIS — Z885 Allergy status to narcotic agent status: Secondary | ICD-10-CM | POA: Diagnosis not present

## 2020-03-07 DIAGNOSIS — I10 Essential (primary) hypertension: Secondary | ICD-10-CM | POA: Diagnosis present

## 2020-03-07 DIAGNOSIS — K589 Irritable bowel syndrome without diarrhea: Secondary | ICD-10-CM | POA: Diagnosis present

## 2020-03-07 DIAGNOSIS — Z881 Allergy status to other antibiotic agents status: Secondary | ICD-10-CM | POA: Diagnosis not present

## 2020-03-07 DIAGNOSIS — Z882 Allergy status to sulfonamides status: Secondary | ICD-10-CM | POA: Diagnosis not present

## 2020-03-07 DIAGNOSIS — N179 Acute kidney failure, unspecified: Secondary | ICD-10-CM | POA: Diagnosis present

## 2020-03-07 DIAGNOSIS — G8929 Other chronic pain: Secondary | ICD-10-CM | POA: Diagnosis present

## 2020-03-07 DIAGNOSIS — F909 Attention-deficit hyperactivity disorder, unspecified type: Secondary | ICD-10-CM | POA: Diagnosis present

## 2020-03-07 DIAGNOSIS — K5792 Diverticulitis of intestine, part unspecified, without perforation or abscess without bleeding: Secondary | ICD-10-CM | POA: Diagnosis present

## 2020-03-07 DIAGNOSIS — R6520 Severe sepsis without septic shock: Secondary | ICD-10-CM

## 2020-03-07 DIAGNOSIS — Z87442 Personal history of urinary calculi: Secondary | ICD-10-CM | POA: Diagnosis not present

## 2020-03-07 DIAGNOSIS — Z9049 Acquired absence of other specified parts of digestive tract: Secondary | ICD-10-CM | POA: Diagnosis not present

## 2020-03-07 DIAGNOSIS — A419 Sepsis, unspecified organism: Secondary | ICD-10-CM

## 2020-03-07 DIAGNOSIS — G44209 Tension-type headache, unspecified, not intractable: Secondary | ICD-10-CM | POA: Diagnosis present

## 2020-03-07 DIAGNOSIS — R109 Unspecified abdominal pain: Secondary | ICD-10-CM

## 2020-03-07 DIAGNOSIS — F32A Depression, unspecified: Secondary | ICD-10-CM

## 2020-03-07 DIAGNOSIS — Z23 Encounter for immunization: Secondary | ICD-10-CM | POA: Diagnosis not present

## 2020-03-07 DIAGNOSIS — Z888 Allergy status to other drugs, medicaments and biological substances status: Secondary | ICD-10-CM | POA: Diagnosis not present

## 2020-03-07 DIAGNOSIS — Z91041 Radiographic dye allergy status: Secondary | ICD-10-CM | POA: Diagnosis not present

## 2020-03-07 DIAGNOSIS — N10 Acute tubulo-interstitial nephritis: Secondary | ICD-10-CM

## 2020-03-07 DIAGNOSIS — A4151 Sepsis due to Escherichia coli [E. coli]: Secondary | ICD-10-CM | POA: Diagnosis present

## 2020-03-07 DIAGNOSIS — N12 Tubulo-interstitial nephritis, not specified as acute or chronic: Secondary | ICD-10-CM | POA: Diagnosis present

## 2020-03-07 LAB — URINALYSIS, ROUTINE W REFLEX MICROSCOPIC
Bilirubin Urine: NEGATIVE
Glucose, UA: NEGATIVE mg/dL
Hgb urine dipstick: NEGATIVE
Ketones, ur: 20 mg/dL — AB
Nitrite: NEGATIVE
Protein, ur: 100 mg/dL — AB
Specific Gravity, Urine: 1.016 (ref 1.005–1.030)
WBC, UA: >50 WBC/hpf — ABNORMAL HIGH (ref 0–5)
pH: 5 (ref 5.0–8.0)

## 2020-03-07 LAB — CBC
HCT: 36 % (ref 36.0–46.0)
Hemoglobin: 12.4 g/dL (ref 12.0–15.0)
MCH: 30.5 pg (ref 26.0–34.0)
MCHC: 34.4 g/dL (ref 30.0–36.0)
MCV: 88.7 fL (ref 80.0–100.0)
Platelets: 122 10*3/uL — ABNORMAL LOW (ref 150–400)
RBC: 4.06 MIL/uL (ref 3.87–5.11)
RDW: 13.2 % (ref 11.5–15.5)
WBC: 12.5 10*3/uL — ABNORMAL HIGH (ref 4.0–10.5)
nRBC: 0 % (ref 0.0–0.2)

## 2020-03-07 LAB — CBG MONITORING, ED
Glucose-Capillary: 118 mg/dL — ABNORMAL HIGH (ref 70–99)
Glucose-Capillary: 121 mg/dL — ABNORMAL HIGH (ref 70–99)

## 2020-03-07 LAB — BASIC METABOLIC PANEL
Anion gap: 9 (ref 5–15)
BUN: 20 mg/dL (ref 6–20)
CO2: 25 mmol/L (ref 22–32)
Calcium: 8.6 mg/dL — ABNORMAL LOW (ref 8.9–10.3)
Chloride: 96 mmol/L — ABNORMAL LOW (ref 98–111)
Creatinine, Ser: 1 mg/dL (ref 0.44–1.00)
GFR, Estimated: >60 mL/min (ref >60)
Glucose, Bld: 153 mg/dL — ABNORMAL HIGH (ref 70–99)
Potassium: 4.9 mmol/L (ref 3.5–5.1)
Sodium: 130 mmol/L — ABNORMAL LOW (ref 135–145)

## 2020-03-07 LAB — HEMOGLOBIN A1C
Hgb A1c MFr Bld: 8 % — ABNORMAL HIGH (ref 4.8–5.6)
Mean Plasma Glucose: 182.9 mg/dL

## 2020-03-07 LAB — GLUCOSE, CAPILLARY
Glucose-Capillary: 116 mg/dL — ABNORMAL HIGH (ref 70–99)
Glucose-Capillary: 119 mg/dL — ABNORMAL HIGH (ref 70–99)

## 2020-03-07 LAB — RESP PANEL BY RT-PCR (FLU A&B, COVID) ARPGX2
Influenza A by PCR: NEGATIVE
Influenza B by PCR: NEGATIVE
SARS Coronavirus 2 by RT PCR: NEGATIVE

## 2020-03-07 LAB — HCG, QUANTITATIVE, PREGNANCY: hCG, Beta Chain, Quant, S: 2 m[IU]/mL (ref <5)

## 2020-03-07 LAB — HIV ANTIBODY (ROUTINE TESTING W REFLEX): HIV Screen 4th Generation wRfx: NONREACTIVE

## 2020-03-07 MED ORDER — SODIUM CHLORIDE 0.9 % IV SOLN: INTRAVENOUS | Status: DC

## 2020-03-07 MED ORDER — DIPHENHYDRAMINE HCL 50 MG/ML IJ SOLN
12.5000 mg | INTRAMUSCULAR | Status: AC | PRN
  Administered 2020-03-07 (×2): 12.5 mg via INTRAVENOUS
  Filled 2020-03-07 (×2): qty 1

## 2020-03-07 MED ORDER — HYDROMORPHONE HCL 1 MG/ML IJ SOLN
0.5000 mg | Freq: Once | INTRAMUSCULAR | Status: AC
  Administered 2020-03-07: 02:00:00 0.5 mg via INTRAVENOUS
  Filled 2020-03-07: qty 1

## 2020-03-07 MED ORDER — LACTATED RINGERS IV BOLUS (SEPSIS)
1500.0000 mL | Freq: Once | INTRAVENOUS | Status: AC
  Administered 2020-03-07: 01:00:00 1500 mL via INTRAVENOUS

## 2020-03-07 MED ORDER — VORTIOXETINE HBR 20 MG PO TABS
20.0000 mg | ORAL_TABLET | Freq: Every day | ORAL | Status: DC
  Administered 2020-03-07 – 2020-03-11 (×5): 20 mg via ORAL
  Filled 2020-03-07 (×2): qty 4
  Filled 2020-03-07 (×3): qty 1
  Filled 2020-03-07: qty 4

## 2020-03-07 MED ORDER — SODIUM CHLORIDE 0.9 % IV SOLN: 1.0000 g | INTRAVENOUS | Status: DC

## 2020-03-07 MED ORDER — SODIUM CHLORIDE 0.9 % IV SOLN
2.0000 g | INTRAVENOUS | Status: DC
  Administered 2020-03-08: 01:00:00 2 g via INTRAVENOUS
  Filled 2020-03-07: qty 20
  Filled 2020-03-07: qty 2

## 2020-03-07 MED ORDER — ONDANSETRON HCL 4 MG PO TABS
4.0000 mg | ORAL_TABLET | Freq: Four times a day (QID) | ORAL | Status: DC | PRN
  Administered 2020-03-09 – 2020-03-10 (×3): 4 mg via ORAL
  Filled 2020-03-07 (×3): qty 1

## 2020-03-07 MED ORDER — ALBUTEROL SULFATE HFA 108 (90 BASE) MCG/ACT IN AERS
2.0000 | INHALATION_SPRAY | RESPIRATORY_TRACT | Status: DC | PRN
  Filled 2020-03-07: qty 6.7

## 2020-03-07 MED ORDER — ACETAMINOPHEN 325 MG PO TABS
650.0000 mg | ORAL_TABLET | Freq: Four times a day (QID) | ORAL | Status: DC | PRN
  Administered 2020-03-07 – 2020-03-08 (×3): 650 mg via ORAL
  Filled 2020-03-07 (×3): qty 2

## 2020-03-07 MED ORDER — DIPHENHYDRAMINE HCL 25 MG PO CAPS
25.0000 mg | ORAL_CAPSULE | Freq: Four times a day (QID) | ORAL | Status: DC | PRN
  Filled 2020-03-07: qty 1

## 2020-03-07 MED ORDER — KETOROLAC TROMETHAMINE 30 MG/ML IJ SOLN
30.0000 mg | Freq: Once | INTRAMUSCULAR | Status: AC
  Administered 2020-03-07: 08:00:00 30 mg via INTRAVENOUS
  Filled 2020-03-07: qty 1

## 2020-03-07 MED ORDER — INSULIN ASPART 100 UNIT/ML ~~LOC~~ SOLN
0.0000 [IU] | Freq: Three times a day (TID) | SUBCUTANEOUS | Status: DC
  Administered 2020-03-07 – 2020-03-11 (×6): 2 [IU] via SUBCUTANEOUS
  Filled 2020-03-07: qty 0.15

## 2020-03-07 MED ORDER — BUPROPION HCL ER (XL) 300 MG PO TB24
300.0000 mg | ORAL_TABLET | Freq: Every day | ORAL | Status: DC
  Administered 2020-03-07 – 2020-03-11 (×5): 300 mg via ORAL
  Filled 2020-03-07 (×3): qty 1
  Filled 2020-03-07: qty 2
  Filled 2020-03-07: qty 1

## 2020-03-07 MED ORDER — HYDROMORPHONE HCL 1 MG/ML IJ SOLN
0.5000 mg | INTRAMUSCULAR | Status: AC | PRN
  Administered 2020-03-07 (×2): 0.5 mg via INTRAVENOUS
  Filled 2020-03-07 (×2): qty 1

## 2020-03-07 MED ORDER — ONDANSETRON HCL 4 MG/2ML IJ SOLN
4.0000 mg | Freq: Four times a day (QID) | INTRAMUSCULAR | Status: DC | PRN
  Administered 2020-03-07 – 2020-03-08 (×3): 4 mg via INTRAVENOUS
  Filled 2020-03-07 (×3): qty 2

## 2020-03-07 MED ORDER — BUSPIRONE HCL 5 MG PO TABS
15.0000 mg | ORAL_TABLET | Freq: Two times a day (BID) | ORAL | Status: DC
  Administered 2020-03-07 – 2020-03-11 (×9): 15 mg via ORAL
  Filled 2020-03-07 (×8): qty 3
  Filled 2020-03-07: qty 2

## 2020-03-07 MED ORDER — INFLUENZA VAC SPLIT QUAD 0.5 ML IM SUSY
0.5000 mL | PREFILLED_SYRINGE | INTRAMUSCULAR | Status: AC
  Administered 2020-03-08: 09:00:00 0.5 mL via INTRAMUSCULAR
  Filled 2020-03-07: qty 0.5

## 2020-03-07 MED ORDER — METOPROLOL TARTRATE 25 MG PO TABS
25.0000 mg | ORAL_TABLET | Freq: Two times a day (BID) | ORAL | Status: DC
  Administered 2020-03-07 – 2020-03-11 (×9): 25 mg via ORAL
  Filled 2020-03-07 (×9): qty 1

## 2020-03-07 MED ORDER — HYDROMORPHONE HCL 1 MG/ML IJ SOLN
0.5000 mg | INTRAMUSCULAR | Status: DC | PRN
Start: 2020-03-07 — End: 2020-03-10
  Administered 2020-03-07 – 2020-03-10 (×11): 0.5 mg via INTRAVENOUS
  Filled 2020-03-07 (×8): qty 0.5
  Filled 2020-03-07: qty 1
  Filled 2020-03-07 (×2): qty 0.5

## 2020-03-07 MED ORDER — NORTRIPTYLINE HCL 25 MG PO CAPS
50.0000 mg | ORAL_CAPSULE | Freq: Every day | ORAL | Status: DC
  Administered 2020-03-07 – 2020-03-10 (×4): 50 mg via ORAL
  Filled 2020-03-07 (×6): qty 2

## 2020-03-07 MED ORDER — KETOROLAC TROMETHAMINE 15 MG/ML IJ SOLN
15.0000 mg | Freq: Four times a day (QID) | INTRAMUSCULAR | Status: DC | PRN
  Administered 2020-03-07 – 2020-03-08 (×3): 15 mg via INTRAVENOUS
  Filled 2020-03-07 (×3): qty 1

## 2020-03-07 MED ORDER — SODIUM CHLORIDE 0.9 % IV SOLN
2.0000 g | Freq: Once | INTRAVENOUS | Status: AC
  Administered 2020-03-07: 02:00:00 2 g via INTRAVENOUS
  Filled 2020-03-07: qty 20

## 2020-03-07 MED ORDER — METHOCARBAMOL 500 MG PO TABS
500.0000 mg | ORAL_TABLET | Freq: Three times a day (TID) | ORAL | Status: DC
  Administered 2020-03-07 – 2020-03-10 (×12): 500 mg via ORAL
  Filled 2020-03-07 (×15): qty 1

## 2020-03-07 MED ORDER — INSULIN ASPART 100 UNIT/ML ~~LOC~~ SOLN
0.0000 [IU] | Freq: Every day | SUBCUTANEOUS | Status: DC
  Filled 2020-03-07: qty 0.05

## 2020-03-07 MED ORDER — ACETAMINOPHEN 325 MG PO TABS
650.0000 mg | ORAL_TABLET | Freq: Four times a day (QID) | ORAL | Status: DC | PRN
  Administered 2020-03-07: 650 mg via ORAL
  Filled 2020-03-07: qty 2

## 2020-03-07 MED ORDER — ACETAMINOPHEN 650 MG RE SUPP: 650.0000 mg | Freq: Four times a day (QID) | RECTAL | Status: DC | PRN

## 2020-03-07 MED ORDER — METOPROLOL TARTRATE 25 MG PO TABS: 75.0000 mg | ORAL_TABLET | Freq: Two times a day (BID) | ORAL | Status: DC

## 2020-03-07 MED ORDER — ENOXAPARIN SODIUM 40 MG/0.4ML ~~LOC~~ SOLN
40.0000 mg | SUBCUTANEOUS | Status: DC
  Administered 2020-03-07: 11:00:00 40 mg via SUBCUTANEOUS
  Filled 2020-03-07 (×4): qty 0.4

## 2020-03-07 MED ORDER — HYDRALAZINE HCL 25 MG PO TABS: 25.0000 mg | ORAL_TABLET | Freq: Three times a day (TID) | ORAL | Status: DC | PRN

## 2020-03-07 MED ORDER — PNEUMOCOCCAL VAC POLYVALENT 25 MCG/0.5ML IJ INJ
0.5000 mL | INJECTION | INTRAMUSCULAR | Status: AC
  Administered 2020-03-11: 0.5 mL via INTRAMUSCULAR
  Filled 2020-03-07 (×4): qty 0.5

## 2020-03-07 MED ORDER — PANTOPRAZOLE SODIUM 40 MG PO TBEC
80.0000 mg | DELAYED_RELEASE_TABLET | Freq: Every day | ORAL | Status: DC
  Administered 2020-03-07 – 2020-03-10 (×4): 80 mg via ORAL
  Filled 2020-03-07 (×6): qty 2

## 2020-03-07 NOTE — ED Notes (Signed)
DO [facility] notified of patient elevated temp.

## 2020-03-07 NOTE — ED Notes (Signed)
Patient transported to CT 

## 2020-03-07 NOTE — ED Notes (Signed)
Dr. Marlowe Sax MD notified regarding pt's elevated temp of 103.76f. Pt given ice water and cold compress. Awaiting dr's orders. Pt cannot have tylenol until 10:30.

## 2020-03-07 NOTE — H&P (Addendum)
History and Physical   [name] [id number] DOB: 09/07/1965 DOA: 03/06/2020  PCP: [facility], [facility]  Outpatient Specialists: [facility], sees urologist  Patient coming from: home via private vehicle  I have personally briefly reviewed patient's old medical records in [place].  Chief Concern: fever and generalize pain  HPI: [name] is a 54 y.o. female with medical history significant for multiple kidney stones that she sees a urologist for, palpitations and high lbood pressure on metoprolol tartrate 25 mg BID, GERD on omeprazole 40 mg, depression/anxiety on (nortryptiline 50 mg , bupropion 300 mg daily, trintilix 20 mg daily, buspar 15 mg bid per patient as she has her medication box with her), non-insulin-dependent diabetes mellitus on Metformin 500 mg daily, chronic low back pain on mobic 15 mg daily to the emergency department for chief concerns of abdominal pain.  She reports the abdominal pain that started AM of day of presentation.she endorses associated fever and nausea. She denies vomiting.  She reports Tmax at home was 104 prompting her to present in the ED.   She describes the pain as dull, achy, persistent, at peak it is a 3-4/10. She reports this pain is similar to when she had kidney stones.  Furthermore, she endorses headache started Tuesday 03/01/20, throbbing, behind both eyes, she endorses the headache makes her cry. She endorses headaches like this in the past when she has had fever. She states the headache is 10/10 lasting 10-15 seconds. She endorses having eye pains as well.  She denies sick contacts. She denies recent travel. She endorses covid - 19 infection via [place] and [place] once. She endorses wanting the J&J booster if available.   She endorses diffused joint and muscle ankles, alternating joints at different times.   ROS: Constitutional: no weight change, + fever ENT/Mouth: no sore throat, no rhinorrhea Eyes: + eye  pain, no vision changes Cardiovascular: no chest pain, no dyspnea,  no edema, no palpitations Respiratory: no cough, + sputum that is white, no wheezing Gastrointestinal: + nausea, no vomiting, no diarrhea, no constipation Genitourinary: no urinary incontinence, no dysuria, no hematuria Musculoskeletal: + arthralgias, + myalgias Skin: no skin lesions, no pruritus Neuro: + weakness, no loss of consciousness, no syncope, + dizziness Psych: no anxiety, no depression, + decrease appetite, she denies SI/HI Heme/Lymph: no bruising, no bleeding  Social history: lives with mother, spouse, and daughter, and 4 dogs. 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 She denies tobacco use and recreational drug use. She endorses rarely using etoh, she states the last time she had a drink was 4-5 months ago.   ED Course: Discussed with ED provider, requesting admission for sepsis secondary to urinary tract infection.  Assessment/Plan  Active Problems:   Left sided abdominal pain   GERD (gastroesophageal reflux disease)   Depression   UTI (urinary tract infection)   Diabetes mellitus type 2, noninsulin dependent (HCC)   Sepsis (HCC)   Sepsis cannot be ruled out Fever, leukocytosis, sinus tachycardia Suspect secondary to urine source -Patient is not hypotensive and maintaining appropriate MAP -Query severe sepsis with organ involvement, renal -Ceftriaxone IV per ED provider, will continue -UA was positive for leukocytes large amounts, negative for nitrites -Urine culture ordered -Blood cultures x2 ordered -CT of the abdomen pelvis without contrast was read as mild right-sided perinephric inflammatory fat stranding is seen, a 3 mm nonobstructing renal stone is seen within the mid to lower right kidney. -Kidney stone may be  a source of infection -CBC  Acute kidney injury-avoid nephrotoxic agents -Holding home Mobic -IVF normal saline at 150 cc/h -Holding home  Metformin  Leukocytosis-with positive ANC, multifactorial including demyelination via steroid that she was discharged home with on 02/17/2020 versus infection/UTI -Antibiotics as above -CBC in the a.m.  Non-insulin-dependent diabetes mellitus-holding home Metformin -Insulin SSI with at bedtime coverage  GERD resumed PPI  Depression/anxiety-resumed home regimen, patient denies SI, HI -Buspirone 50 mg twice daily p.o. -Bupropion 300 mg p.o. daily -Nortriptyline 50 mg daily p.o. nightly -Trintellix 20 mg daily  Headache-suspect cluster headache with patient's presentation -CT of the head without contrast -Oxygen supplementation via NRB as needed for headaches ordered  Hypertension-resumed home metoprolol 25 mg twice daily and 25 mg every 8 hours as needed for SBP greater than 160  Current medications: Acetaminophen, Dilaudid, Benadryl, ondansetron  Chart reviewed.   DVT prophylaxis: Enoxaparin Code Status: full code Diet: Heart healthy/carb modified Family Communication: patient updated her spouse Disposition Plan: Pending clinical course Consults called: None at this time Admission status: Observation with telemetry  Past Medical History:  Diagnosis Date  . ADHD (attention deficit hyperactivity disorder)   . ADHD (attention deficit hyperactivity disorder)   . C. difficile diarrhea   . Chronic back pain   . Depression   . Difficulty sleeping   . Diverticulosis   . GERD (gastroesophageal reflux disease)   . Hiatal hernia   . Nephrolithiasis   . Palpitations   . PONV (postoperative nausea and vomiting)   . Psoriasis   . Right ureteral stone 09/14/2012  . Tubular adenoma    colonoscopy 03/2013  . UTI (lower urinary tract infection)    Past Surgical History:  Procedure Laterality Date  . ANTERIOR CRUCIATE LIGAMENT REPAIR    . ANTERIOR CRUCIATE LIGAMENT REPAIR    . CHOLECYSTECTOMY    . COLONOSCOPY  03/2013  . CYSTOSCOPY WITH RETROGRADE PYELOGRAM, URETEROSCOPY AND STENT  PLACEMENT    . CYSTOSCOPY WITH RETROGRADE PYELOGRAM, URETEROSCOPY AND STENT PLACEMENT    . CYSTOSCOPY WITH RETROGRADE PYELOGRAM, URETEROSCOPY AND STENT PLACEMENT    . CYSTOSCOPY WITH URETEROSCOPY N/A 09/14/2012   Procedure: CYSTOSCOPY WITH URETEROSCOPY, with  stent placement;  Surgeon: Malka So, MD;  Location: [facility];  Service: Urology;  Laterality: N/A;  . CYSTOSCOPY/RETROGRADE/URETEROSCOPY/STONE EXTRACTION WITH BASKET Left 11/10/2013   Procedure: LEFT URETEROSCOPY, CYSTOSCOPY;  Surgeon: Malka So, MD;  Location: [facility];  Service: Urology;  Laterality: Left;  . PARTIAL COLECTOMY    . WRIST SURGERY     Social History:  reports that she has never smoked. She has never used smokeless tobacco. She reports current alcohol use. She reports that she does not use drugs.  Allergies  Allergen Reactions  . Sulfa Antibiotics Hives and Swelling    Face swelling  . Ciprofloxacin Other (See Comments)    Joint inflammation  . Flagyl [Metronidazole] Other (See Comments)    Doesn't remember reaction but should not take  . Ivp Dye [Iodinated Diagnostic Agents] Hives  . Other Itching    Narcotic pain medicines cause itching but can be managed with benadryl  . Metoclopramide Palpitations   Family History  Problem Relation Age of Onset  . Osteoarthritis Mother   . Arthritis Mother   . Asthma Mother   . Osteoarthritis Maternal Grandmother   . Osteoarthritis Maternal Grandfather   . ADD / ADHD Son   . ADD / ADHD Daughter   . Colon cancer Neg Hx    Family history:  Family history reviewed and not pertinent  Prior to Admission medications   Medication Sig Start Date End Date Taking? Authorizing Provider  acetaminophen (TYLENOL) 500 MG tablet Take 1,000 mg by mouth every 6 (six) hours as needed for moderate pain.    [provider]  albuterol (PROVENTIL HFA;VENTOLIN HFA) 108 (90 BASE) MCG/ACT inhaler Inhale 2 puffs into the lungs every 4 (four) hours as needed (for bronchitis).    [provider]  amitriptyline ([name]) 25 MG tablet Take 1 tablet (25 mg total) by mouth at bedtime. 07/15/14   Pyrtle, Lajuan Lines, MD  amphetamine-dextroamphetamine (ADDERALL XR) 20 MG 24 hr capsule Take 20 mg by mouth every morning.     [provider]  Coenzyme Q10 (CO Q 10 PO) Take 1 tablet by mouth at bedtime.    [provider]  dicyclomine (BENTYL) 10 MG capsule Take 1 capsule (10 mg total) by mouth 4 (four) times daily as needed for spasms. 07/15/14   Pyrtle, Lajuan Lines, MD  diphenhydrAMINE (BENADRYL) 25 mg capsule Take 25 mg by mouth every 6 (six) hours as needed for itching.    [provider]  hydrocortisone valerate cream (WESTCORT) 0.2 % Apply 1 application topically 4 (four) times daily as needed (for eczema/psoriasis on face).    [provider]  mesalamine (LIALDA) 1.2 G EC tablet Take 2 tablets (2.4 g total) by mouth 2 (two) times daily. 07/15/14   Pyrtle, Lajuan Lines, MD  metoprolol tartrate (LOPRESSOR) 25 MG tablet Take 25 mg by mouth 2 (two) times daily.    [provider]  Multiple Vitamin (MULTIVITAMIN WITH MINERALS) TABS tablet Take 1 tablet by mouth daily.    [provider]  omeprazole (PRILOSEC) 40 MG capsule Take 40 mg by mouth 2 (two) times daily.    [provider]  ondansetron (ZOFRAN) 8 MG tablet Take 1 tablet (8 mg total) by mouth 2 (two) times daily as needed for nausea or vomiting. 12/16/13   Pyrtle, Lajuan Lines, MD  polyethylene glycol (MIRALAX / GLYCOLAX) packet Take 17 g by mouth as needed.     [provider]  predniSONE (STERAPRED UNI-PAK 21 TAB) 10 MG (21) TBPK tablet Take by mouth daily. Take steroid taper as written 02/17/20   Hall-Potvin, [place], PA-C  Probiotic Product (VSL#3) CAPS Take 2 capsules by mouth daily. 07/15/14   Pyrtle, Lajuan Lines, MD  promethazine (PHENERGAN) 25 MG tablet Take 1 tablet (25 mg total) by mouth every 6 (six) hours as needed for nausea or vomiting. 12/16/13   Pyrtle, Lajuan Lines, MD  rifaximin  (XIFAXAN) 550 MG TABS tablet Take 1 tablet (550 mg total) by mouth 3 (three) times daily. 06/28/14   Pyrtle, Lajuan Lines, MD  traMADol (ULTRAM) 50 MG tablet Take 1 tablet (50 mg total) by mouth 3 (three) times daily as needed. 12/16/13   Pyrtle, Lajuan Lines, MD  Vitamin E 400 UNITS TABS Take 2 tablets (800 Units total) by mouth daily. 04/21/14   Pyrtle, Lajuan Lines, MD  zolpidem (AMBIEN) 10 MG tablet Take 10 mg by mouth at bedtime as needed for sleep.    [provider]   Physical Exam: Vitals:   03/07/20 0430 03/07/20 0500 03/07/20 0529 03/07/20 0530  BP: (!) 159/97 (!) 148/89    Pulse: 97 (!) 102  100  Resp: (!) 25 (!) 27  (!) 27  Temp:   (!) 102.6 F (39.2 C)   TempSrc:   Oral   SpO2: 97% 96%  98%  Weight:      Height:       Constitutional: appears age-appropriate, NAD, calm, comfortable Eyes: PERRL, lids and conjunctivae normal ENMT: Mucous membranes are moist. Posterior pharynx clear of any exudate or lesions. Age-appropriate dentition. Hearing appropriate Neck: normal, supple, no masses, no thyromegaly Respiratory: clear to auscultation bilaterally, no wheezing, no crackles. Normal respiratory effort. No accessory muscle use.  Cardiovascular: Regular rate and rhythm, no murmurs / rubs / gallops. No extremity edema. 2+ pedal pulses. No carotid bruits.  Abdomen: no tenderness, no masses palpated, no hepatosplenomegaly. Bowel sounds positive.  Musculoskeletal: no clubbing / cyanosis. No joint deformity upper and lower extremities. Good ROM, no contractures, no atrophy. Normal muscle tone.  Skin: no rashes, lesions, ulcers. No induration Neurologic: Sensation intact. Strength 5/5 in all 4.  Psychiatric: Normal judgment and insight. Alert and oriented x 3. Normal mood.   EKG: Not indicated  Chest x-ray on Admission: I personally reviewed and I agree with radiologist reading as below.  CT ABDOMEN PELVIS WO CONTRAST  Result Date: 03/07/2020 CLINICAL DATA:  Left flank pain. EXAM: CT ABDOMEN  AND PELVIS WITHOUT CONTRAST TECHNIQUE: Multidetector CT imaging of the abdomen and pelvis was performed following the standard protocol without IV contrast. COMPARISON:  August 18, 2017 FINDINGS: Lower chest: No acute abnormality. Hepatobiliary: No focal liver abnormality is seen. Status post cholecystectomy. No biliary dilatation. Pancreas: Unremarkable. No pancreatic ductal dilatation or surrounding inflammatory changes. Spleen: Normal in size without focal abnormality. Adrenals/Urinary Tract: Adrenal glands are unremarkable. Kidneys are normal in size, without focal lesions. A 3 mm nonobstructing renal stone is seen within the mid to lower right kidney. Mild right-sided perinephric inflammatory fat stranding is seen. Bladder is unremarkable. Stomach/Bowel: Stomach is within normal limits. Appendix appears normal. Surgically anastomosed bowel is seen within the region of the mid sigmoid colon. No evidence of bowel wall dilatation. Noninflamed diverticula are seen within the descending and sigmoid colon. Vascular/Lymphatic: No significant vascular findings are present. No enlarged abdominal or pelvic lymph nodes. Reproductive: Uterus and bilateral adnexa are unremarkable. Other: No abdominal wall hernia or abnormality. No abdominopelvic ascites. Musculoskeletal: No acute or significant osseous findings. IMPRESSION: 1. 3 mm nonobstructing renal stone within the mid to lower right kidney. 2. Mild right-sided perinephric inflammatory fat stranding. Correlation with urinalysis is recommended to exclude the presence of an underlying infectious process. 3. Colonic diverticulosis. 4. Evidence of prior cholecystectomy. Electronically Signed   By: Virgina Norfolk M.D.   On: 03/07/2020 00:18   DG Chest 2 View  Result Date: 03/06/2020 CLINICAL DATA:  Suspected sepsis. EXAM: CHEST - 2 VIEW COMPARISON:  04/13/2016 FINDINGS: The heart size and mediastinal contours are within normal limits. Both lungs are clear. The  visualized skeletal structures are unremarkable. IMPRESSION: No active cardiopulmonary disease. Electronically Signed   By: Constance Holster M.D.   On: 03/06/2020 23:17   Labs on Admission: I have personally reviewed following labs  CBC: Recent Labs  Lab 03/06/20 2233  WBC 13.8*  NEUTROABS 11.2*  HGB 15.7*  HCT 46.3*  MCV 89.0  PLT 123XX123   Basic Metabolic Panel: Recent Labs  Lab 03/06/20 2233  NA 131*  K 4.5  CL 94*  CO2 22  GLUCOSE 160*  BUN 24*  CREATININE 1.22*  CALCIUM 9.7   GFR: Estimated Creatinine Clearance: 60.9 mL/min (A) (by C-G formula based on SCr of 1.22 mg/dL (H)). Liver Function Tests: Recent Labs  Lab 03/06/20 2233  AST 25  ALT 38  ALKPHOS  97  BILITOT 0.7  PROT 8.1  ALBUMIN 3.9   Coagulation Profile: Recent Labs  Lab 03/06/20 2233  INR 1.1   Urine analysis:    Component Value Date/Time   COLORURINE YELLOW 03/06/2020 2233   APPEARANCEUR CLEAR 03/06/2020 2233   APPEARANCEUR Clear 09/12/2011 1223   LABSPEC 1.016 03/06/2020 2233   LABSPEC 1.015 09/12/2011 1223   PHURINE 5.0 03/06/2020 2233   GLUCOSEU NEGATIVE 03/06/2020 2233   GLUCOSEU Negative 09/12/2011 1223   HGBUR NEGATIVE 03/06/2020 2233   BILIRUBINUR NEGATIVE 03/06/2020 2233   BILIRUBINUR Negative 09/12/2011 1223   KETONESUR 20 (A) 03/06/2020 2233   PROTEINUR 100 (A) 03/06/2020 2233   UROBILINOGEN 2.0 (H) 10/26/2013 0017   NITRITE NEGATIVE 03/06/2020 2233   LEUKOCYTESUR LARGE (A) 03/06/2020 2233   LEUKOCYTESUR Negative 09/12/2011 1223   Meldrick Buttery N Jahvon Gosline D.O. [facility]  If 7PM-7AM, please contact overnight-coverage provider If 7AM-7PM, please contact day coverage provider [email or web address]  03/07/2020, 5:59 AM

## 2020-03-07 NOTE — Progress Notes (Signed)
[facility] Progress Note  Patient: [name]    [id number]  DOA: 03/06/2020     Date of Service: the patient was seen and examined on 03/07/2020  Brief hospital course: Past medical history of recurrent urinary stone, HTN, depression, GERD, NIDDM.  Presents with complaints of fever and flank pain and found to have right-sided pyonephritis with sepsis. Currently plan is continue IV antibiotics.  Assessment and Plan: 1.  Sepsis secondary to pyelonephritis, POA Meeting SIRS criteria with fever leukocytosis and tachycardia. CT scan shows evidence of right-sided perinephric stranding. On IV fluids. Also on IV antibiotics although will increase the dose from IV ceftriaxone from 1 g to 2 g. UA shows pyuria. Follow-up on cultures. Pain control and fever control.  2.  Acute kidney injury Likely prerenal. Monitor. Hold Metformin.  3.  Type 2 diabetes mellitus, uncontrolled with hyperglycemia Holding home Metformin. On sliding scale insulin.  4.  Essential hypertension Blood pressure stable.  Monitor.  5.  Headache. CT head unremarkable. Continue pain control.  6.  Depression, anxiety Continue home medication.  Monitor.  7.  Obesity. Placing the patient at high risk for poor outcome. Body mass index is 34.95 kg/m.   Diet: Carb preferred diet DVT Prophylaxis:   enoxaparin (LOVENOX) injection 40 mg Start: 03/07/20 1000 Place TED hose Start: 03/07/20 0548    Advance goals of care discussion: Full code  Family Communication: no family was present at bedside, at the time of interview.   Disposition:  Status is: Inpatient  Remains inpatient appropriate because:IV treatments appropriate due to intensity of illness or inability to take PO and Inpatient level of care appropriate due to severity of illness   Dispo: The patient is from: Home              Anticipated d/c is to: Home              Anticipated d/c date is: 3 days              Patient currently  is not medically stable to d/c.        Subjective: No nausea no vomiting.  Still has headache.  No fever no chills.  Has bilateral groin pain.  No diarrhea.  Physical Exam:  General: Appear in mild distress, no Rash; Oral Mucosa Clear, moist. no Abnormal Neck Mass Or lumps, Conjunctiva normal  Cardiovascular: S1 and S2 Present, no Murmur, Respiratory: good respiratory effort, Bilateral Air entry present and CTA, no Crackles, no wheezes Abdomen: Bowel Sound present, Soft and mild bilateral flank tenderness Extremities: no Pedal edema Neurology: alert and oriented to time, place, and person affect appropriate. no new focal deficit Gait not checked due to patient safety concerns  Vitals:   03/07/20 1325 03/07/20 1330 03/07/20 1430 03/07/20 1649  BP:    115/79  Pulse:  91 81 92  Resp:  20 (!) 27 18  Temp: 99.8 F (37.7 C)   (!) 101.6 F (38.7 C)  TempSrc: Oral   Oral  SpO2:  98% 95% 99%  Weight:      Height:        Intake/Output Summary (Last 24 hours) at 03/07/2020 1943 Last data filed at 03/07/2020 1934 Gross per 24 hour  Intake 2500 ml  Output 200 ml  Net 2300 ml   Filed Weights   03/06/20 2341  Weight: 95.3 kg    Data Reviewed: I have personally reviewed and interpreted daily labs, tele strips, imagings as discussed above. I reviewed  all nursing notes, pharmacy notes, vitals, pertinent old records I have discussed plan of care as described above with RN and patient/family.  CBC: Recent Labs  Lab 03/06/20 2233 03/07/20 0638  WBC 13.8* 12.5*  NEUTROABS 11.2*  --   HGB 15.7* 12.4  HCT 46.3* 36.0  MCV 89.0 88.7  PLT 185 [id number]*   Basic Metabolic Panel: Recent Labs  Lab 03/06/20 2233 03/07/20 0638  NA 131* 130*  K 4.5 4.9  CL 94* 96*  CO2 22 25  GLUCOSE 160* 153*  BUN 24* 20  CREATININE 1.22* 1.00  CALCIUM 9.7 8.6*    Studies: CT ABDOMEN PELVIS WO CONTRAST  Result Date: 03/07/2020 CLINICAL DATA:  Left flank pain. EXAM: CT ABDOMEN AND PELVIS  WITHOUT CONTRAST TECHNIQUE: Multidetector CT imaging of the abdomen and pelvis was performed following the standard protocol without IV contrast. COMPARISON:  August 18, 2017 FINDINGS: Lower chest: No acute abnormality. Hepatobiliary: No focal liver abnormality is seen. Status post cholecystectomy. No biliary dilatation. Pancreas: Unremarkable. No pancreatic ductal dilatation or surrounding inflammatory changes. Spleen: Normal in size without focal abnormality. Adrenals/Urinary Tract: Adrenal glands are unremarkable. Kidneys are normal in size, without focal lesions. A 3 mm nonobstructing renal stone is seen within the mid to lower right kidney. Mild right-sided perinephric inflammatory fat stranding is seen. Bladder is unremarkable. Stomach/Bowel: Stomach is within normal limits. Appendix appears normal. Surgically anastomosed bowel is seen within the region of the mid sigmoid colon. No evidence of bowel wall dilatation. Noninflamed diverticula are seen within the descending and sigmoid colon. Vascular/Lymphatic: No significant vascular findings are present. No enlarged abdominal or pelvic lymph nodes. Reproductive: Uterus and bilateral adnexa are unremarkable. Other: No abdominal wall hernia or abnormality. No abdominopelvic ascites. Musculoskeletal: No acute or significant osseous findings. IMPRESSION: 1. 3 mm nonobstructing renal stone within the mid to lower right kidney. 2. Mild right-sided perinephric inflammatory fat stranding. Correlation with urinalysis is recommended to exclude the presence of an underlying infectious process. 3. Colonic diverticulosis. 4. Evidence of prior cholecystectomy. Electronically Signed   By: Virgina Norfolk M.D.   On: 03/07/2020 00:18   DG Chest 2 View  Result Date: 03/06/2020 CLINICAL DATA:  Suspected sepsis. EXAM: CHEST - 2 VIEW COMPARISON:  04/13/2016 FINDINGS: The heart size and mediastinal contours are within normal limits. Both lungs are clear. The visualized skeletal  structures are unremarkable. IMPRESSION: No active cardiopulmonary disease. Electronically Signed   By: Constance Holster M.D.   On: 03/06/2020 23:17   CT HEAD WO CONTRAST  Result Date: 03/07/2020 CLINICAL DATA:  54 year old female with headache. EXAM: CT HEAD WITHOUT CONTRAST TECHNIQUE: Contiguous axial images were obtained from the base of the skull through the vertex without intravenous contrast. COMPARISON:  Brain MRI 02/07/2017 and earlier. FINDINGS: Brain: Stable cerebral volume since 2018. Gray-white matter differentiation is stable and within normal limits. No midline shift, ventriculomegaly, mass effect, evidence of mass lesion, intracranial hemorrhage or evidence of cortically based acute infarction. Vascular: Mild Calcified atherosclerosis at the skull base. No suspicious intracranial vascular hyperdensity. Skull: No acute osseous abnormality identified. Sinuses/Orbits: Visualized paranasal sinuses and mastoids are stable and well pneumatized. Other: Visualized orbits and scalp soft tissues are within normal limits. IMPRESSION: Stable and negative noncontrast CT appearance of the brain. Electronically Signed   By: Genevie Ann M.D.   On: 03/07/2020 06:35    Scheduled Meds: . buPROPion  300 mg Oral Daily  . busPIRone  15 mg Oral BID  . enoxaparin (LOVENOX) injection  40  mg Subcutaneous Q24H  . [START ON 03/08/2020] influenza vac split quadrivalent PF  0.5 mL Intramuscular Tomorrow-1000  . insulin aspart  0-15 Units Subcutaneous TID WC  . insulin aspart  0-5 Units Subcutaneous QHS  . methocarbamol  500 mg Oral TID  . metoprolol tartrate  25 mg Oral BID  . nortriptyline  50 mg Oral QHS  . pantoprazole  80 mg Oral Daily  . [START ON 03/08/2020] pneumococcal 23 valent vaccine  0.5 mL Intramuscular Tomorrow-1000  . vortioxetine HBr  20 mg Oral Daily   Continuous Infusions: . sodium chloride 75 mL/hr at 03/07/20 0825  . [START ON 03/08/2020] cefTRIAXone (ROCEPHIN)  IV     PRN Meds:  acetaminophen **OR** acetaminophen, albuterol, hydrALAZINE, HYDROmorphone (DILAUDID) injection, ketorolac, ondansetron **OR** ondansetron (ZOFRAN) IV  Time spent: 35 minutes  Author: Berle Mull, MD [facility] 03/07/2020 7:43 PM  To reach On-call, see care teams to locate the attending and reach out via [email or web address]. Between 7PM-7AM, please contact night-coverage If you still have difficulty reaching the attending provider, please page the [facility] (Director on Call) for [facility] on amion for assistance.

## 2020-03-07 NOTE — ED Notes (Signed)
Report given to [facility]

## 2020-03-07 NOTE — ED Notes (Signed)
Notified Alyson Reedy, RN of Pt's temperature.

## 2020-03-08 ENCOUNTER — Inpatient Hospital Stay (HOSPITAL_COMMUNITY): Payer: No Typology Code available for payment source

## 2020-03-08 DIAGNOSIS — A419 Sepsis, unspecified organism: Secondary | ICD-10-CM | POA: Diagnosis not present

## 2020-03-08 DIAGNOSIS — A4151 Sepsis due to Escherichia coli [E. coli]: Secondary | ICD-10-CM | POA: Diagnosis not present

## 2020-03-08 LAB — TSH: TSH: 1.431 u[IU]/mL (ref 0.350–4.500)

## 2020-03-08 LAB — GLUCOSE, CAPILLARY
Glucose-Capillary: 103 mg/dL — ABNORMAL HIGH (ref 70–99)
Glucose-Capillary: 139 mg/dL — ABNORMAL HIGH (ref 70–99)
Glucose-Capillary: 78 mg/dL (ref 70–99)
Glucose-Capillary: 159 mg/dL — ABNORMAL HIGH (ref 70–99)

## 2020-03-08 LAB — PROTIME-INR
INR: 1.2 (ref 0.8–1.2)
Prothrombin Time: 14.7 seconds (ref 11.4–15.2)

## 2020-03-08 LAB — PROCALCITONIN: Procalcitonin: 2.32 ng/mL

## 2020-03-08 MED ORDER — KETOROLAC TROMETHAMINE 30 MG/ML IJ SOLN
30.0000 mg | Freq: Four times a day (QID) | INTRAMUSCULAR | Status: DC
  Administered 2020-03-08 – 2020-03-09 (×2): 30 mg via INTRAVENOUS
  Filled 2020-03-08 (×2): qty 1

## 2020-03-08 MED ORDER — SODIUM CHLORIDE 0.9 % IV SOLN
2.0000 g | Freq: Three times a day (TID) | INTRAVENOUS | Status: DC
  Administered 2020-03-08 – 2020-03-11 (×9): 2 g via INTRAVENOUS
  Filled 2020-03-08 (×8): qty 2

## 2020-03-08 MED ORDER — SALINE SPRAY 0.65 % NA SOLN: 1.0000 | NASAL | Status: DC | PRN

## 2020-03-08 MED ORDER — ENSURE MAX PROTEIN PO LIQD
11.0000 [oz_av] | Freq: Every day | ORAL | Status: DC
  Administered 2020-03-09 – 2020-03-11 (×3): 11 [oz_av] via ORAL
  Filled 2020-03-08 (×5): qty 330

## 2020-03-08 MED ORDER — PROMETHAZINE HCL 25 MG PO TABS
25.0000 mg | ORAL_TABLET | Freq: Four times a day (QID) | ORAL | Status: DC | PRN
  Administered 2020-03-08 – 2020-03-10 (×2): 25 mg via ORAL
  Filled 2020-03-08 (×3): qty 1

## 2020-03-08 MED ORDER — ACETAMINOPHEN 500 MG PO TABS
1000.0000 mg | ORAL_TABLET | Freq: Once | ORAL | Status: AC
  Administered 2020-03-08: 19:00:00 1000 mg via ORAL
  Filled 2020-03-08: qty 2

## 2020-03-08 NOTE — Progress Notes (Signed)
[facility] Progress Note  Patient: [name]    ASN:[id number]  DOA: 03/06/2020     Date of Service: the patient was seen and examined on 03/08/2020  Brief hospital course: Past medical history of recurrent urinary stone, HTN, depression, GERD, NIDDM.  Presents with complaints of fever and flank pain and found to have right-sided pyonephritis with sepsis. Ongoing fever despite 48 h of antibiotics.  Changing from ceftriaxone to cefepime Currently plan is continue IV antibiotics.  Assessment and Plan: 1.  Sepsis secondary to pyelonephritis, POA Meeting SIRS criteria with fever leukocytosis and tachycardia. CT scan shows evidence of right-sided perinephric stranding. On IV fluids. Also on IV antibiotics Initially was on IV ceftriaxone 1 g.  Dose increased to 2 g. Due to persistent high fever ceftriaxone was changed to cefepime. Repeat cultures ordered. Initial cultures negative. Ultrasound renal also ordered. May require urology input. Continue Pain control and fever control.  2.  Acute kidney injury Likely prerenal.  Resolved Monitor. Hold Metformin.  3.  Type 2 diabetes mellitus, uncontrolled with hyperglycemia Holding home Metformin. On sliding scale insulin.  4.  Essential hypertension Blood pressure stable.  Monitor.  5.  Headache. CT head unremarkable. Continue pain control.  6.  Depression, anxiety Continue home medication.  Monitor.  7.  Obesity. Placing the patient at high risk for poor outcome. Body mass index is 34.78 kg/m.   Diet: Carb diet DVT Prophylaxis:   enoxaparin (LOVENOX) injection 40 mg Start: 03/07/20 1000 Place TED hose Start: 03/07/20 0548    Advance goals of care discussion: Full code  Family Communication: no family was present at bedside, at the time of interview.   Disposition:  Status is: Inpatient  Remains inpatient appropriate because:IV treatments appropriate due to intensity of illness or inability to take PO  and Inpatient level of care appropriate due to severity of illness   Dispo: The patient is from: Home              Anticipated d/c is to: Home              Anticipated d/c date is: 3 days              Patient currently is not medically stable to d/c.        Subjective: Continues to have headache.  The distal half flank pain.  No nausea no vomiting.  Oral intake improving.  Physical Exam:  General: Appear in mild distress, no Rash; Oral Mucosa Clear, moist. no Abnormal Neck Mass Or lumps, Conjunctiva normal  Cardiovascular: S1 and S2 Present, no Murmur, Respiratory: Normal respiratory effort, Bilateral Air entry present and no crackles, no wheezes Abdomen: Bowel Sound present, Soft and mild abdominal tenderness Extremities: trace Pedal edema Neurology: alert and oriented to time, place, and person affect appropriate. no new focal deficit Gait not checked due to patient safety concerns    Vitals:   03/08/20 1500 03/08/20 1700 03/08/20 1800 03/08/20 1949  BP:    116/74  Pulse:    85  Resp:    16  Temp:  (!) 100.9 F (38.3 C) (!) 102.9 F (39.4 C) 99.5 F (37.5 C)  TempSrc:  Oral Oral Oral  SpO2:    92%  Weight: 94.8 kg     Height:        Intake/Output Summary (Last 24 hours) at 03/08/2020 2050 Last data filed at 03/08/2020 1700 Gross per 24 hour  Intake 1989.17 ml  Output --  Net 1989.17 ml  Filed Weights   03/06/20 2341 03/08/20 1500  Weight: 95.3 kg 94.8 kg    Data Reviewed: I have personally reviewed and interpreted daily labs, tele strips, imagings as discussed above. I reviewed all nursing notes, pharmacy notes, vitals, pertinent old records I have discussed plan of care as described above with RN and patient/family.  CBC: Recent Labs  Lab 03/06/20 2233 03/07/20 0638  WBC 13.8* 12.5*  NEUTROABS 11.2*  --   HGB 15.7* 12.4  HCT 46.3* 36.0  MCV 89.0 88.7  PLT 185 122*   Basic Metabolic Panel: Recent Labs  Lab 03/06/20 2233 03/07/20 0638  NA  131* 130*  K 4.5 4.9  CL 94* 96*  CO2 22 25  GLUCOSE 160* 153*  BUN 24* 20  CREATININE 1.22* 1.00  CALCIUM 9.7 8.6*    Studies: US RENAL  Result Date: 03/08/2020 CLINICAL DATA:  Pyelonephritis. EXAM: RENAL / URINARY TRACT ULTRASOUND COMPLETE COMPARISON:  Noncontrast abdominal CT yesterday. FINDINGS: Right Kidney: Renal measurements: 12.7 x 5.5 x 6.2 cm = volume: 227 mL. Echogenicity within normal limits. The tiny intrarenal stone on CT is not well visualized by ultrasound. No mass or hydronephrosis visualized. No perinephric fluid collection. Left Kidney: Renal measurements: 12.2 x 6.0 x 6.6 cm = volume: 252 mL. Echogenicity within normal limits. No mass or hydronephrosis visualized. No perinephric fluid collection. Bladder: Appears normal for degree of bladder distention. Only minimally distended, patient voided prior to the exam. Other: Incidental note of hepatic steatosis. IMPRESSION: 1. No hydronephrosis or obstructive uropathy. No perinephric collection. 2. Nonobstructing intrarenal right renal stone on CT yesterday is not resolved by ultrasound. Electronically Signed   By: Narda Rutherford M.D.   On: 03/08/2020 19:58    Scheduled Meds: . buPROPion  300 mg Oral Daily  . busPIRone  15 mg Oral BID  . enoxaparin (LOVENOX) injection  40 mg Subcutaneous Q24H  . insulin aspart  0-15 Units Subcutaneous TID WC  . insulin aspart  0-5 Units Subcutaneous QHS  . ketorolac  30 mg Intravenous QID  . methocarbamol  500 mg Oral TID  . metoprolol tartrate  25 mg Oral BID  . nortriptyline  50 mg Oral QHS  . pantoprazole  80 mg Oral Daily  . pneumococcal 23 valent vaccine  0.5 mL Intramuscular Tomorrow-1000  . [START ON 03/09/2020] Ensure Max Protein  11 oz Oral Daily  . vortioxetine HBr  20 mg Oral Daily   Continuous Infusions: . sodium chloride 75 mL/hr at 03/08/20 0824  . ceFEPime (MAXIPIME) IV 2 g (03/08/20 1907)   PRN Meds: acetaminophen **OR** acetaminophen, albuterol, diphenhydrAMINE,  hydrALAZINE, HYDROmorphone (DILAUDID) injection, ondansetron **OR** ondansetron (ZOFRAN) IV, promethazine, sodium chloride  Time spent: 35 minutes  Author: Lynden Oxford, MD [facility] 03/08/2020 8:50 PM  To reach On-call, see care teams to locate the attending and reach out via [email or web address]. Between 7PM-7AM, please contact night-coverage If you still have difficulty reaching the attending provider, please page the [facility] (Director on Call) for [facility] on amion for assistance.

## 2020-03-08 NOTE — Progress Notes (Signed)
Initial Nutrition Assessment  RD working remotely.  DOCUMENTATION CODES:   Obesity unspecified  INTERVENTION:  Provide Ensure Max Protein po once daily, each supplement provides 150 kcal and 30 grams of protein.  Recommend obtaining accurate weight during admission.  NUTRITION DIAGNOSIS:   Inadequate oral intake (PTA) related to decreased appetite,nausea as evidenced by per patient/family report.  GOAL:   Patient will meet greater than or equal to 90% of their needs  MONITOR:   PO intake,Supplement acceptance,Labs,Weight trends,I & O's  REASON FOR ASSESSMENT:   Malnutrition Screening Tool    ASSESSMENT:   54 year old female with PMHx of chronic back pain, diverticulosis, ADHD, nephrolithiasis, GERD, DM, depression, hiatal hernia admitted with sepsis secondary to pyelonephritis, AKI, headache.   Spoke with patient over the phone. She reports her appetite and intake have been decreased for the past week PTA and she was not able to eat well. She endorses pain and nausea. She reports she is still having pain today but her nausea and appetite have improved. She reports she was able to eat 100% of her lunch today. Per chart she also ate 100% of her breakfast today. Discussed importance of adequate protein intake. Encouraged adequate intake of protein at meals. Patient is concerned about carbohydrate content of ONS but after discussing options is amenable to trying Ensure Max Protein.  Patient reports her UBW is around 215 lbs. She reports she has lost approximately 7 lbs (3.2% body weight) over the past week, which is significant for time frame. Limited recent weight history in chart to trend (last wt 2016) and wt on admission appears stated as it is exactly 210 lbs (95.3 kg). Will continue to monitor weight trend.  Medications reviewed and include: Novolog 0-15 units TID, Novolog 0-5 units QHS, Protonix, NS at 75 mL/hr, ceftriaxone.  Labs reviewed: CBG 103-139, Sodium 130, Chloride  96.  Unable to determine if patient meets criteria for malnutrition at this time.  NUTRITION - FOCUSED PHYSICAL EXAM:  Unable to complete at this time as RD is working remotely.  Diet Order:   Diet Order            Diet heart healthy/carb modified Room service appropriate? Yes; Fluid consistency: Thin  Diet effective now                EDUCATION NEEDS:   No education needs have been identified at this time  Skin:  Skin Assessment: Reviewed RN Assessment  Last BM:  03/07/2020 per chart  Height:   Ht Readings from Last 1 Encounters:  03/06/20 5\' 5"  (1.651 m)   Weight:   Wt Readings from Last 1 Encounters:  03/06/20 95.3 kg   BMI:  Body mass index is 34.95 kg/m.  Estimated Nutritional Needs:   Kcal:  1900-2100  Protein:  100-110 grams  Fluid:  >/= 2 L/day  03/08/20, MS, RD, LDN Pager number available on Amion

## 2020-03-08 NOTE — Progress Notes (Signed)
Pharmacy Antibiotic Note  [name] is a 54 y.o. female admitted on 03/06/2020 with fever and flank pain.  She was found to have right-sided pyonephritis with sepsis, initially treated with Ceftriaxone.  Pharmacy has now been consulted for Cefepime dosing. Tm 103.1 SCr decreased to 1.0 WBC down to 12.5 PCT 2.32  Plan: Cefepime 2g IV q8h Follow up renal function, culture results, and clinical course.   Height: 5\' 5"  (165.1 cm) Weight: 94.8 kg (208 lb 15.9 oz) IBW/kg (Calculated) : 57  Temp (24hrs), Avg:100.3 F (37.9 C), Min:98.7 F (37.1 C), Max:102.9 F (39.4 C)  Recent Labs  Lab 03/06/20 2233 03/06/20 2249 03/07/20 0638  WBC 13.8*  --  12.5*  CREATININE 1.22*  --  1.00  LATICACIDVEN  --  1.6  --     Estimated Creatinine Clearance: 74.1 mL/min (by C-G formula based on SCr of 1 mg/dL).    Allergies  Allergen Reactions  . Sulfa Antibiotics Hives and Swelling    Face swelling  . Amoxicillin-Pot Clavulanate Diarrhea    Other reaction(s): Myalgias (intolerance), Other (See Comments) Diarrhea   . Ciprofloxacin Other (See Comments)    Joint inflammation  . Donepezil     Other reaction(s): Arthralgias (intolerance)  . Flagyl [Metronidazole] Other (See Comments)    Doesn't remember reaction but should not take  . Ivp Dye [Iodinated Diagnostic Agents] Hives  . Other Itching    Narcotic pain medicines cause itching but can be managed with benadryl  . Metoclopramide Palpitations    Antimicrobials this admission: 12/28 Ceftriaxone >> 12/28 12/28 Cefepime >>   Dose adjustments this admission:   Microbiology results: 12/26 BCx: ngtd  Thank you for allowing pharmacy to be a part of this patient's care.  1/27 PharmD, BCPS Clinical Pharmacist [facility] main pharmacy [phone] 03/08/2020 6:19 PM

## 2020-03-09 DIAGNOSIS — A419 Sepsis, unspecified organism: Secondary | ICD-10-CM | POA: Diagnosis not present

## 2020-03-09 LAB — GLUCOSE, CAPILLARY
Glucose-Capillary: 133 mg/dL — ABNORMAL HIGH (ref 70–99)
Glucose-Capillary: 103 mg/dL — ABNORMAL HIGH (ref 70–99)
Glucose-Capillary: 143 mg/dL — ABNORMAL HIGH (ref 70–99)
Glucose-Capillary: 186 mg/dL — ABNORMAL HIGH (ref 70–99)

## 2020-03-09 LAB — CBC WITH DIFFERENTIAL/PLATELET
Abs Immature Granulocytes: 0.06 10*3/uL (ref 0.00–0.07)
Basophils Absolute: 0 10*3/uL (ref 0.0–0.1)
Basophils Relative: 0 %
Eosinophils Absolute: 0.1 10*3/uL (ref 0.0–0.5)
Eosinophils Relative: 1 %
HCT: 35.6 % — ABNORMAL LOW (ref 36.0–46.0)
Hemoglobin: 11.7 g/dL — ABNORMAL LOW (ref 12.0–15.0)
Immature Granulocytes: 1 %
Lymphocytes Relative: 13 %
Lymphs Abs: 1 10*3/uL (ref 0.7–4.0)
MCH: 29.7 pg (ref 26.0–34.0)
MCHC: 32.9 g/dL (ref 30.0–36.0)
MCV: 90.4 fL (ref 80.0–100.0)
Monocytes Absolute: 0.6 10*3/uL (ref 0.1–1.0)
Monocytes Relative: 7 %
Neutro Abs: 6.1 10*3/uL (ref 1.7–7.7)
Neutrophils Relative %: 78 %
Platelets: 139 10*3/uL — ABNORMAL LOW (ref 150–400)
RBC: 3.94 MIL/uL (ref 3.87–5.11)
RDW: 13.2 % (ref 11.5–15.5)
WBC: 7.8 10*3/uL (ref 4.0–10.5)
nRBC: 0 % (ref 0.0–0.2)

## 2020-03-09 LAB — COMPREHENSIVE METABOLIC PANEL
ALT: 21 U/L (ref 0–44)
AST: 21 U/L (ref 15–41)
Albumin: 2.9 g/dL — ABNORMAL LOW (ref 3.5–5.0)
Alkaline Phosphatase: 61 U/L (ref 38–126)
Anion gap: 12 (ref 5–15)
BUN: 11 mg/dL (ref 6–20)
CO2: 22 mmol/L (ref 22–32)
Calcium: 8.6 mg/dL — ABNORMAL LOW (ref 8.9–10.3)
Chloride: 105 mmol/L (ref 98–111)
Creatinine, Ser: 0.76 mg/dL (ref 0.44–1.00)
GFR, Estimated: >60 mL/min (ref >60)
Glucose, Bld: 135 mg/dL — ABNORMAL HIGH (ref 70–99)
Potassium: 3.6 mmol/L (ref 3.5–5.1)
Sodium: 139 mmol/L (ref 135–145)
Total Bilirubin: 0.3 mg/dL (ref 0.3–1.2)
Total Protein: 6.1 g/dL — ABNORMAL LOW (ref 6.5–8.1)

## 2020-03-09 LAB — MAGNESIUM: Magnesium: 2 mg/dL (ref 1.7–2.4)

## 2020-03-09 MED ORDER — OXYCODONE HCL 5 MG PO TABS
5.0000 mg | ORAL_TABLET | ORAL | Status: DC | PRN
  Administered 2020-03-10 (×5): 5 mg via ORAL
  Filled 2020-03-09 (×5): qty 1

## 2020-03-09 NOTE — Progress Notes (Signed)
PROGRESS NOTE   [name]  WUJ:[id number] DOB: 05/22/1965 DOA: 03/06/2020 [facility]: [facility]  Brief Narrative:  54 year old female Known panic disorder, HTN, OSA on CPAP Prior kidney stones previously under care of Dr. Alycia Rossetti Dr. Derinda Late 08/18/2017 3 mm stone urine culture >100,000 Proteus E. Coli Diverticulosis status post sigmoidectomy for diverticulitis, prior C. difficile, IBS previously on Lialda Last endoscopy colonoscopy adenoma;-upper Endo chronic active gastritis with 2 cm hiatal hernia  Assessment & Plan:   Active Problems:   Left sided abdominal pain   GERD (gastroesophageal reflux disease)   Depression   UTI (urinary tract infection)   Diabetes mellitus type 2, noninsulin dependent (HCC)   Sepsis (HCC)   1. Sepsis 2/2 pyelonephritis a. Cultures are pending-ceftriaxone transition to cefepime b. Follow trends of WBC-currently is remaining afebrile c. Likely can discharge once cultures finalized in the next 24 hours 2. Chronic left-sided abdominal pain IBS a. Continue Toradol 30 4 times daily- transition Dilaudid to p.o. oxycodone 5/325 b. Currently not taking Lialda or rifaximin 3. Reflux a. Continue Protonix 80 daily 4. HTN a. Patient not taking metoprolol anymore per her 5. Panic disorder a.  resume bupropion 300 daily, BuSpar 15 twice daily b. Pamelor 50 at bedtime, amitriptyline 25 is not on her MAR 6. OSA on CPAP a. Continue CPAP at night b. Outpatient titration 7. Prior kidney stones  DVT prophylaxis: Lovenox Code Status: Full Family Communication: None Disposition:  Status is: Inpatient  Remains inpatient appropriate because:Hemodynamically unstable, Persistent severe electrolyte disturbances and Ongoing diagnostic testing needed not appropriate for outpatient work up   Dispo: The patient is from: Home              Anticipated d/c is to: Home              Anticipated d/c date is: 1 day              Patient currently is not  medically stable to d/c.       Consultants:   None  Procedures: No   Antimicrobials: Cefepime   Subjective: Awake alert coherent no distress Eating and drinking-had an omelette today No fever no chills  Objective: Vitals:   03/08/20 1949 03/08/20 2155 03/09/20 0149 03/09/20 0542  BP: 116/74 122/81 103/85 (!) 135/97  Pulse: 85 73 73 85  Resp: 16 16 16 18   Temp: 99.5 F (37.5 C) 98.1 F (36.7 C) 98.1 F (36.7 C) 98.7 F (37.1 C)  TempSrc: Oral Oral Oral Oral  SpO2: 92% 98% 97% 100%  Weight:      Height:        Intake/Output Summary (Last 24 hours) at 03/09/2020 1105 Last data filed at 03/09/2020 0300 Gross per 24 hour  Intake 1912.52 ml  Output --  Net 1912.52 ml   Filed Weights   03/06/20 2341 03/08/20 1500  Weight: 95.3 kg 94.8 kg    Examination: EOMI NCAT no focal deficit Neck soft supple Chest clear no added sound no rales no rhonchi Abdomen soft slight tenderness left flank No lower extremity edema Neurologically intact moving all 4 limbs   Data Reviewed: I have personally reviewed following labs and imaging studies  Sodium 139 potassium 3.6 BUNs/creatinine 11/0.7 WBC 7.8 hemoglobin 11.7 platelet 139 Radiology Studies: 03/10/20 RENAL  Result Date: 03/08/2020 CLINICAL DATA:  Pyelonephritis. EXAM: RENAL / URINARY TRACT ULTRASOUND COMPLETE COMPARISON:  Noncontrast abdominal CT yesterday. FINDINGS: Right Kidney: Renal measurements: 12.7 x 5.5 x 6.2 cm = volume: 227 mL. Echogenicity  within normal limits. The tiny intrarenal stone on CT is not well visualized by ultrasound. No mass or hydronephrosis visualized. No perinephric fluid collection. Left Kidney: Renal measurements: 12.2 x 6.0 x 6.6 cm = volume: 252 mL. Echogenicity within normal limits. No mass or hydronephrosis visualized. No perinephric fluid collection. Bladder: Appears normal for degree of bladder distention. Only minimally distended, patient voided prior to the exam. Other: Incidental note of  hepatic steatosis. IMPRESSION: 1. No hydronephrosis or obstructive uropathy. No perinephric collection. 2. Nonobstructing intrarenal right renal stone on CT yesterday is not resolved by ultrasound. Electronically Signed   By: Keith Rake M.D.   On: 03/08/2020 19:58     Scheduled Meds: . buPROPion  300 mg Oral Daily  . busPIRone  15 mg Oral BID  . enoxaparin (LOVENOX) injection  40 mg Subcutaneous Q24H  . insulin aspart  0-15 Units Subcutaneous TID WC  . insulin aspart  0-5 Units Subcutaneous QHS  . ketorolac  30 mg Intravenous QID  . methocarbamol  500 mg Oral TID  . metoprolol tartrate  25 mg Oral BID  . nortriptyline  50 mg Oral QHS  . pantoprazole  80 mg Oral Daily  . pneumococcal 23 valent vaccine  0.5 mL Intramuscular Tomorrow-1000  . Ensure Max Protein  11 oz Oral Daily  . vortioxetine HBr  20 mg Oral Daily   Continuous Infusions: . sodium chloride 75 mL/hr at 03/08/20 2058  . ceFEPime (MAXIPIME) IV 2 g (03/09/20 1028)     LOS: 2 days    Time spent: [place], MD [facility] To contact the attending provider between 7A-7P or the covering provider during after hours 7P-7A, please log into the web site [email or web address] and access using universal [place] password for that web site. If you do not have the password, please call the hospital operator.  03/09/2020, 11:05 AM

## 2020-03-10 DIAGNOSIS — A419 Sepsis, unspecified organism: Secondary | ICD-10-CM | POA: Diagnosis not present

## 2020-03-10 LAB — COMPREHENSIVE METABOLIC PANEL
ALT: 32 U/L (ref 0–44)
AST: 31 U/L (ref 15–41)
Albumin: 2.8 g/dL — ABNORMAL LOW (ref 3.5–5.0)
Alkaline Phosphatase: 61 U/L (ref 38–126)
Anion gap: 10 (ref 5–15)
BUN: 12 mg/dL (ref 6–20)
CO2: 22 mmol/L (ref 22–32)
Calcium: 8.8 mg/dL — ABNORMAL LOW (ref 8.9–10.3)
Chloride: 106 mmol/L (ref 98–111)
Creatinine, Ser: 0.8 mg/dL (ref 0.44–1.00)
GFR, Estimated: >60 mL/min (ref >60)
Glucose, Bld: 141 mg/dL — ABNORMAL HIGH (ref 70–99)
Potassium: 3.5 mmol/L (ref 3.5–5.1)
Sodium: 138 mmol/L (ref 135–145)
Total Bilirubin: 0.4 mg/dL (ref 0.3–1.2)
Total Protein: 6 g/dL — ABNORMAL LOW (ref 6.5–8.1)

## 2020-03-10 LAB — URINE CULTURE: Culture: NO GROWTH

## 2020-03-10 LAB — CBC WITH DIFFERENTIAL/PLATELET
Abs Immature Granulocytes: 0.09 10*3/uL — ABNORMAL HIGH (ref 0.00–0.07)
Basophils Absolute: 0 10*3/uL (ref 0.0–0.1)
Basophils Relative: 0 %
Eosinophils Absolute: 0.1 10*3/uL (ref 0.0–0.5)
Eosinophils Relative: 2 %
HCT: 34.5 % — ABNORMAL LOW (ref 36.0–46.0)
Hemoglobin: 11.3 g/dL — ABNORMAL LOW (ref 12.0–15.0)
Immature Granulocytes: 1 %
Lymphocytes Relative: 20 %
Lymphs Abs: 1.6 10*3/uL (ref 0.7–4.0)
MCH: 29.4 pg (ref 26.0–34.0)
MCHC: 32.8 g/dL (ref 30.0–36.0)
MCV: 89.8 fL (ref 80.0–100.0)
Monocytes Absolute: 0.6 10*3/uL (ref 0.1–1.0)
Monocytes Relative: 8 %
Neutro Abs: 5.2 10*3/uL (ref 1.7–7.7)
Neutrophils Relative %: 69 %
Platelets: 184 10*3/uL (ref 150–400)
RBC: 3.84 MIL/uL — ABNORMAL LOW (ref 3.87–5.11)
RDW: 13.2 % (ref 11.5–15.5)
WBC: 7.6 10*3/uL (ref 4.0–10.5)
nRBC: 0 % (ref 0.0–0.2)

## 2020-03-10 LAB — GLUCOSE, CAPILLARY
Glucose-Capillary: 113 mg/dL — ABNORMAL HIGH (ref 70–99)
Glucose-Capillary: 126 mg/dL — ABNORMAL HIGH (ref 70–99)
Glucose-Capillary: 82 mg/dL (ref 70–99)
Glucose-Capillary: 126 mg/dL — ABNORMAL HIGH (ref 70–99)

## 2020-03-10 LAB — MAGNESIUM: Magnesium: 1.7 mg/dL (ref 1.7–2.4)

## 2020-03-10 MED ORDER — HYDROMORPHONE HCL 1 MG/ML IJ SOLN: 0.5000 mg | INTRAMUSCULAR | Status: DC | PRN

## 2020-03-10 MED ORDER — IBUPROFEN 200 MG PO TABS
600.0000 mg | ORAL_TABLET | Freq: Three times a day (TID) | ORAL | Status: DC
  Administered 2020-03-10 – 2020-03-11 (×3): 600 mg via ORAL
  Filled 2020-03-10 (×3): qty 3

## 2020-03-10 MED ORDER — ACETAMINOPHEN 500 MG PO TABS
1000.0000 mg | ORAL_TABLET | Freq: Four times a day (QID) | ORAL | Status: DC | PRN
  Administered 2020-03-10 (×2): 1000 mg via ORAL
  Filled 2020-03-10 (×2): qty 2

## 2020-03-10 NOTE — Progress Notes (Signed)
PROGRESS NOTE   [name]  QQV:[id number] DOB: 06/30/1965 DOA: 03/06/2020 P[facility]: [facility]  Brief Narrative:  54 year old female Known panic disorder, HTN, OSA on CPAP Prior kidney stones previously under care of Dr. Alycia Rossetti Dr. Derinda Late 08/18/2017 3 mm stone urine culture >100,000 Proteus E. Coli Diverticulosis status post sigmoidectomy for diverticulitis, prior C. difficile, IBS previously on Lialda Last endoscopy colonoscopy adenoma;-upper Endo chronic active gastritis with 2 cm hiatal hernia  Assessment & Plan:   Active Problems:   Left sided abdominal pain   GERD (gastroesophageal reflux disease)   Depression   UTI (urinary tract infection)   Diabetes mellitus type 2, noninsulin dependent (HCC)   Sepsis (HCC)   1. Sepsis 2/2 pyelonephritis a. Cultures are pending-ceftriaxone transition to cefepime--would not narrow until she completes at least 3 days of IV as per below rationale b. Urine cultures were obtained 12/28 so may be culture negative but given the fact that she worsened initially with white count etc. will need a consolidated at least 7 days of antibiotics 3 if dose should be IV c. White count is improved 2. Probable tension headache a. Patient experiencing pain in a bandlike fashion across frontal head b. Explained symptoms more in keeping with tension headache c. Start ibuprofen 600 3 times daily, increase Tylenol to 1000 mg d. Long discussion about weaning Dilaudid and changing it from every 3 to every 4 continue oxycodone for now 3. Chronic left-sided abdominal pain IBS a. Continue Toradol 30 4 times daily- transition Dilaudid to p.o. oxycodone 5/325 b. Currently not taking Lialda or rifaximin 4. Reflux a. Continue Protonix 80 daily 5. HTN a. Patient not taking metoprolol anymore per her 6. Panic disorder a.  resume bupropion 300 daily, BuSpar 15 twice daily b. Pamelor 50 at bedtime, amitriptyline 25 is not on her MAR 7. OSA on  CPAP a. Continue CPAP at night b. Outpatient titration 8. Prior kidney stones  DVT prophylaxis: Lovenox Code Status: Full Family Communication: None Disposition:  Status is: Inpatient  Remains inpatient appropriate because:Hemodynamically unstable, Persistent severe electrolyte disturbances and Ongoing diagnostic testing needed not appropriate for outpatient work up   Dispo: The patient is from: Home              Anticipated d/c is to: Home              Anticipated d/c date is: 1 day              Patient currently is not medically stable to d/c.       Consultants:   None  Procedures: No   Antimicrobials: Cefepime   Subjective: Doing fair but has headache for which she was treating with some oxycodone and Dilaudid No fever no chills eating better Less pain in her flank No nausea no vomiting.today   Objective: Vitals:   03/09/20 0542 03/09/20 1440 03/09/20 2100 03/10/20 0537  BP: (!) 135/97 110/74 (!) 148/84 122/76  Pulse: 85 72 79 81  Resp: 18 16 16 14   Temp: 98.7 F (37.1 C) 98.5 F (36.9 C) 99.5 F (37.5 C) 99.5 F (37.5 C)  TempSrc: Oral Oral Oral Oral  SpO2: 100% 98% 98% 90%  Weight:      Height:        Intake/Output Summary (Last 24 hours) at 03/10/2020 1204 Last data filed at 03/10/2020 03/12/2020 Gross per 24 hour  Intake 3395.3 ml  Output 4 ml  Net 3391.3 ml   Filed Weights   03/06/20 2341  03/08/20 1500  Weight: 95.3 kg 94.8 kg    Examination:  EOMI NCAT no focal deficit thick neck No rales no rhonchi Abdomen soft no rebound no guarding No lower extremity edema S1-S2 no murmur Psych euthymic pleasant  Data Reviewed: I have personally reviewed following labs and imaging studies  Sodium 138 potassium 3.5 BUNs/creatinine 12/0.8 LFTs normal White count 7.6 hemoglobin 11.3  Radiology Studies: US RENAL  Result Date: 03/08/2020 CLINICAL DATA:  Pyelonephritis. EXAM: RENAL / URINARY TRACT ULTRASOUND COMPLETE COMPARISON:  Noncontrast  abdominal CT yesterday. FINDINGS: Right Kidney: Renal measurements: 12.7 x 5.5 x 6.2 cm = volume: 227 mL. Echogenicity within normal limits. The tiny intrarenal stone on CT is not well visualized by ultrasound. No mass or hydronephrosis visualized. No perinephric fluid collection. Left Kidney: Renal measurements: 12.2 x 6.0 x 6.6 cm = volume: 252 mL. Echogenicity within normal limits. No mass or hydronephrosis visualized. No perinephric fluid collection. Bladder: Appears normal for degree of bladder distention. Only minimally distended, patient voided prior to the exam. Other: Incidental note of hepatic steatosis. IMPRESSION: 1. No hydronephrosis or obstructive uropathy. No perinephric collection. 2. Nonobstructing intrarenal right renal stone on CT yesterday is not resolved by ultrasound. Electronically Signed   By: Narda Rutherford M.D.   On: 03/08/2020 19:58     Scheduled Meds: . buPROPion  300 mg Oral Daily  . busPIRone  15 mg Oral BID  . enoxaparin (LOVENOX) injection  40 mg Subcutaneous Q24H  . ibuprofen  600 mg Oral Q8H  . insulin aspart  0-15 Units Subcutaneous TID WC  . insulin aspart  0-5 Units Subcutaneous QHS  . methocarbamol  500 mg Oral TID  . metoprolol tartrate  25 mg Oral BID  . nortriptyline  50 mg Oral QHS  . pantoprazole  80 mg Oral Daily  . pneumococcal 23 valent vaccine  0.5 mL Intramuscular Tomorrow-1000  . Ensure Max Protein  11 oz Oral Daily  . vortioxetine HBr  20 mg Oral Daily   Continuous Infusions: . ceFEPime (MAXIPIME) IV 2 g (03/10/20 0740)     LOS: 3 days    Time spent: 30  Rhetta Mura, MD [facility] To contact the attending provider between 7A-7P or the covering provider during after hours 7P-7A, please log into the web site [email or web address] and access using universal [place] password for that web site. If you do not have the password, please call the hospital operator.  03/10/2020, 12:04 PM

## 2020-03-11 DIAGNOSIS — A4151 Sepsis due to Escherichia coli [E. coli]: Secondary | ICD-10-CM | POA: Diagnosis not present

## 2020-03-11 DIAGNOSIS — A419 Sepsis, unspecified organism: Secondary | ICD-10-CM | POA: Diagnosis not present

## 2020-03-11 LAB — COMPREHENSIVE METABOLIC PANEL
ALT: 45 U/L — ABNORMAL HIGH (ref 0–44)
AST: 43 U/L — ABNORMAL HIGH (ref 15–41)
Albumin: 2.9 g/dL — ABNORMAL LOW (ref 3.5–5.0)
Alkaline Phosphatase: 61 U/L (ref 38–126)
Anion gap: 11 (ref 5–15)
BUN: 15 mg/dL (ref 6–20)
CO2: 23 mmol/L (ref 22–32)
Calcium: 9 mg/dL (ref 8.9–10.3)
Chloride: 107 mmol/L (ref 98–111)
Creatinine, Ser: 0.88 mg/dL (ref 0.44–1.00)
GFR, Estimated: >60 mL/min (ref >60)
Glucose, Bld: 130 mg/dL — ABNORMAL HIGH (ref 70–99)
Potassium: 3.5 mmol/L (ref 3.5–5.1)
Sodium: 141 mmol/L (ref 135–145)
Total Bilirubin: 0.3 mg/dL (ref 0.3–1.2)
Total Protein: 6.2 g/dL — ABNORMAL LOW (ref 6.5–8.1)

## 2020-03-11 LAB — MAGNESIUM: Magnesium: 1.9 mg/dL (ref 1.7–2.4)

## 2020-03-11 LAB — GLUCOSE, CAPILLARY: Glucose-Capillary: 125 mg/dL — ABNORMAL HIGH (ref 70–99)

## 2020-03-11 MED ORDER — CEPHALEXIN 500 MG PO CAPS: 500.0000 mg | ORAL_CAPSULE | Freq: Three times a day (TID) | ORAL | 0 refills | Status: AC

## 2020-03-11 MED ORDER — CEPHALEXIN 500 MG PO CAPS: 500.0000 mg | ORAL_CAPSULE | Freq: Three times a day (TID) | ORAL | Status: DC

## 2020-03-11 NOTE — Progress Notes (Signed)
   03/08/20 0441  Assess: MEWS Score  Temp (!) 102.2 F (39 C)  BP 131/80  Pulse Rate 100  Resp 14  SpO2 93 %  O2 Device Room Air  Assess: MEWS Score  MEWS Temp 2  MEWS Systolic 0  MEWS Pulse 0  MEWS RR 0  MEWS LOC 0  MEWS Score 2  MEWS Score Color Yellow  Assess: if the MEWS score is Yellow or Red  Were vital signs taken at a resting state? Yes  Focused Assessment Change from prior assessment (see assessment flowsheet)  Early Detection of Sepsis Score *See Row Information* High  MEWS guidelines implemented *See Row Information* Yes  Treat  MEWS Interventions Administered prn meds/treatments (awaiting tylenol to be effective/VS increased)  Take Vital Signs  Increase Vital Sign Frequency  Yellow: Q 2hr X 2 then Q 4hr X 2, if remains yellow, continue Q 4hrs   Reassessment : 100.2 see flowsheet

## 2020-03-11 NOTE — Progress Notes (Signed)
AVS reviewed and questions answered.  Patient alert and oriented, skin warm and dry.  Transported to main lobby and care transferred to family/friend without incident.  

## 2020-03-11 NOTE — Progress Notes (Signed)
Pharmacy Antibiotic Note  [name] is a 54 y.o. female admitted on 03/06/2020 with fever and flank pain.  She was found to have right-sided pyonephritis with sepsis, initially treated with Ceftriaxone.  Pharmacy has now been consulted for Cefepime dosing.  Day #4 Cefepime Afebrile SCr decreased to 0.88 WBC down to 7.6 PCT 2.32 on 12/28 No culture growth  Plan: Continue Cefepime 2g IV q8h Follow up renal function, culture results, and clinical course.   Height: 5\' 5"  (165.1 cm) Weight: 94.8 kg (208 lb 15.9 oz) IBW/kg (Calculated) : 57  Temp (24hrs), Avg:98 F (36.7 C), Min:97.8 F (36.6 C), Max:98.4 F (36.9 C)  Recent Labs  Lab 03/06/20 2233 03/06/20 2249 03/07/20 03/09/20 03/09/20 0533 03/10/20 0549 03/11/20 0605  WBC 13.8*  --  12.5* 7.8 7.6  --   CREATININE 1.22*  --  1.00 0.76 0.80 0.88  LATICACIDVEN  --  1.6  --   --   --   --     Estimated Creatinine Clearance: 84.2 mL/min (by C-G formula based on SCr of 0.88 mg/dL).    Allergies  Allergen Reactions  . Sulfa Antibiotics Hives and Swelling    Face swelling  . Amoxicillin-Pot Clavulanate Diarrhea    Other reaction(s): Myalgias (intolerance), Other (See Comments) Diarrhea   . Ciprofloxacin Other (See Comments)    Joint inflammation  . Donepezil     Other reaction(s): Arthralgias (intolerance)  . Flagyl [Metronidazole] Other (See Comments)    Doesn't remember reaction but should not take  . Ivp Dye [Iodinated Diagnostic Agents] Hives  . Other Itching    Narcotic pain medicines cause itching but can be managed with benadryl  . Metoclopramide Palpitations    Antimicrobials this admission: 12/28 Ceftriaxone >> 12/28 12/28 Cefepime >>    Dose adjustments this admission:    Microbiology results: 12/26 BCx: ngtd 12/28 BCx: ngtd 12/28 Ucx: NGF  Thank you for allowing pharmacy to be a part of this patient's care.  1/29, PharmD, BCPS [facility] main pharmacy [phone] 03/11/2020 7:38 AM

## 2020-03-11 NOTE — Discharge Summary (Signed)
Physician Discharge Summary  VERTIE KRAKOW [id number] DOB: Feb 11, 1966 DOA: 03/06/2020  PCP: [facility]  Admit date: 03/06/2020 Discharge date: 03/11/2020  Time spent: 27 minutes  Recommendations for Outpatient Follow-up:  1. Completing Keflex going forward for UTI 2. Needs CBC Chem-7 in 5 to 7 days 3. Outpatient medication management per PCP  Discharge Diagnoses:  Active Problems:   Left sided abdominal pain   GERD (gastroesophageal reflux disease)   Depression   UTI (urinary tract infection)   Diabetes mellitus type 2, noninsulin dependent ([place])   Sepsis ([place])   Discharge Condition: Improved  Diet recommendation: Heart healthy  Filed Weights   03/06/20 2341 03/08/20 1500  Weight: 95.3 kg 94.8 kg    History of present illness:  54 year old female Known panic disorder, HTN, OSA on CPAP Prior kidney stones previously under care of Dr. Thurmond Butts Dr. Bradly Bienenstock 08/18/2017 3 mm stone urine culture >100,000 Proteus E. Coli Diverticulosis status post sigmoidectomy for diverticulitis, prior C. difficile, IBS previously on Lialda Last endoscopy colonoscopy adenoma;-upper Endo chronic active gastritis with 2 cm hiatal hernia   Hospital Course:   1. Sepsis 2/2 pyelonephritis a. Cultures were performed after initiation of IV antibiotics b.  final culture showed no growth-ceftriaxone transition to cefepime-given fever spikes and 3 days of IV antibiotics at least were given c. Discharging on Keflex-sepsis physiology resolved no fever chills 2. Probable tension headache a. Continue ibuprofen 600 3 times daily, increase Tylenol to 1000 mg b. No opiates on discharge 3. Chronic left-sided abdominal pain IBS a. Continue Toradol 30 4 times daily- transition Dilaudid to p.o. oxycodone 5/325-pain resolved and able to discharge home without pain meds b. Currently not taking Lialda or rifaximin 4. Reflux a. Continue Protonix 80 daily 5. HTN a. P resume metoprolol 70 5 AM  50 p.m.-some confusion about meds to some degree-patient to follow-up with PCP 6. Panic disorder a.  resume bupropion 300 daily, BuSpar 15 twice daily b. Pamelor 50 at bedtime, amitriptyline 25 is not on her MAR 7. OSA on CPAP a. Continue CPAP at night b. Outpatient titration 8. Prior kidney stones     Consultations: None  Discharge Exam: Vitals:   03/10/20 2152 03/11/20 0540  BP: 121/74 (!) 143/96  Pulse: 74 67  Resp: 18 14  Temp:  97.8 F (36.6 C)  SpO2:  97%    General: Awake coherent no distress EOMI NCAT no headache today looks well otherwise Cardiovascular: S1-S2 no murmur no rub no gallop Respiratory: Clinically clear no added sound no rales no rhonchi Abdomen soft no rebound no guarding No lower extremity edema Neurologically intact no focal deficit  Discharge Instructions   Discharge Instructions    Diet - low sodium heart healthy   Complete by: As directed    Discharge instructions   Complete by: As directed    Complete antibiotics for urinary tract infection and report burning itching or other issues if persist to your primary care physician please take your meds as you take at home-every attempt has been made to try to reconcile them and we apologize if there were some errors get labs in about a week at your primary physician office make sure that you follow-up with your specialist in the outpatient setting good luck and happy new year   Increase activity slowly   Complete by: As directed      Allergies as of 03/11/2020      Reactions   Sulfa Antibiotics Hives, Swelling   Face swelling  Amoxicillin-pot Clavulanate Diarrhea   Other reaction(s): Myalgias (intolerance), Other (See Comments) Diarrhea   Ciprofloxacin Other (See Comments)   Joint inflammation   Donepezil    Other reaction(s): Arthralgias (intolerance)   Flagyl [metronidazole] Other (See Comments)   Doesn't remember reaction but should not take   Ivp Dye [iodinated Diagnostic Agents]  Hives   Other Itching   Narcotic pain medicines cause itching but can be managed with benadryl   Metoclopramide Palpitations      Medication List    STOP taking these medications   meloxicam 15 MG tablet Commonly known as: MOBIC   mesalamine 1.2 g EC tablet Commonly known as: Lialda   predniSONE 10 MG (21) Tbpk tablet Commonly known as: STERAPRED UNI-PAK 21 TAB   rifaximin 550 MG Tabs tablet Commonly known as: XIFAXAN   VSL#3 Caps     TAKE these medications   acetaminophen 500 MG tablet Commonly known as: TYLENOL Take 1,000 mg by mouth every 6 (six) hours as needed for moderate pain.   amitriptyline 25 MG tablet Commonly known as: [name] Take 1 tablet (25 mg total) by mouth at bedtime.   amphetamine-dextroamphetamine 20 MG 24 hr capsule Commonly known as: ADDERALL XR Take 20 mg by mouth every morning.   buPROPion 300 MG 24 hr tablet Commonly known as: WELLBUTRIN XL Take 300 mg by mouth daily.   busPIRone 15 MG tablet Commonly known as: BUSPAR Take 15 mg by mouth in the morning and at bedtime.   cephALEXin 500 MG capsule Commonly known as: KEFLEX Take 1 capsule (500 mg total) by mouth every 8 (eight) hours for 12 doses.   ibuprofen 200 MG tablet Commonly known as: ADVIL Take 600 mg by mouth every 6 (six) hours as needed for fever, headache or mild pain.   metFORMIN 500 MG tablet Commonly known as: GLUCOPHAGE Take 500 mg by mouth daily.   metoprolol tartrate 50 MG tablet Commonly known as: LOPRESSOR Take 75 mg by mouth in the morning and at bedtime.   nortriptyline 50 MG capsule Commonly known as: PAMELOR Take 50 mg by mouth at bedtime.   omeprazole 40 MG capsule Commonly known as: PRILOSEC Take 40 mg by mouth 2 (two) times daily.   VITAMIN D PO Take 1 capsule by mouth daily.   Vitamin E 400 units Tabs Take 2 tablets (800 Units total) by mouth daily.   vortioxetine HBr 20 MG Tabs tablet Commonly known as: TRINTELLIX Take 20 mg by mouth  daily.      Allergies  Allergen Reactions  . Sulfa Antibiotics Hives and Swelling    Face swelling  . Amoxicillin-Pot Clavulanate Diarrhea    Other reaction(s): Myalgias (intolerance), Other (See Comments) Diarrhea   . Ciprofloxacin Other (See Comments)    Joint inflammation  . Donepezil     Other reaction(s): Arthralgias (intolerance)  . Flagyl [Metronidazole] Other (See Comments)    Doesn't remember reaction but should not take  . Ivp Dye [Iodinated Diagnostic Agents] Hives  . Other Itching    Narcotic pain medicines cause itching but can be managed with benadryl  . Metoclopramide Palpitations      The results of significant diagnostics from this hospitalization (including imaging, microbiology, ancillary and laboratory) are listed below for reference.    Significant Diagnostic Studies: CT ABDOMEN PELVIS WO CONTRAST  Result Date: 03/07/2020 CLINICAL DATA:  Left flank pain. EXAM: CT ABDOMEN AND PELVIS WITHOUT CONTRAST TECHNIQUE: Multidetector CT imaging of the abdomen and pelvis was performed following the standard  protocol without IV contrast. COMPARISON:  August 18, 2017 FINDINGS: Lower chest: No acute abnormality. Hepatobiliary: No focal liver abnormality is seen. Status post cholecystectomy. No biliary dilatation. Pancreas: Unremarkable. No pancreatic ductal dilatation or surrounding inflammatory changes. Spleen: Normal in size without focal abnormality. Adrenals/Urinary Tract: Adrenal glands are unremarkable. Kidneys are normal in size, without focal lesions. A 3 mm nonobstructing renal stone is seen within the mid to lower right kidney. Mild right-sided perinephric inflammatory fat stranding is seen. Bladder is unremarkable. Stomach/Bowel: Stomach is within normal limits. Appendix appears normal. Surgically anastomosed bowel is seen within the region of the mid sigmoid colon. No evidence of bowel wall dilatation. Noninflamed diverticula are seen within the descending and sigmoid  colon. Vascular/Lymphatic: No significant vascular findings are present. No enlarged abdominal or pelvic lymph nodes. Reproductive: Uterus and bilateral adnexa are unremarkable. Other: No abdominal wall hernia or abnormality. No abdominopelvic ascites. Musculoskeletal: No acute or significant osseous findings. IMPRESSION: 1. 3 mm nonobstructing renal stone within the mid to lower right kidney. 2. Mild right-sided perinephric inflammatory fat stranding. Correlation with urinalysis is recommended to exclude the presence of an underlying infectious process. 3. Colonic diverticulosis. 4. Evidence of prior cholecystectomy. Electronically Signed   By: Virgina Norfolk M.D.   On: 03/07/2020 00:18   DG Chest 2 View  Result Date: 03/06/2020 CLINICAL DATA:  Suspected sepsis. EXAM: CHEST - 2 VIEW COMPARISON:  04/13/2016 FINDINGS: The heart size and mediastinal contours are within normal limits. Both lungs are clear. The visualized skeletal structures are unremarkable. IMPRESSION: No active cardiopulmonary disease. Electronically Signed   By: Constance Holster M.D.   On: 03/06/2020 23:17   CT HEAD WO CONTRAST  Result Date: 03/07/2020 CLINICAL DATA:  54 year old female with headache. EXAM: CT HEAD WITHOUT CONTRAST TECHNIQUE: Contiguous axial images were obtained from the base of the skull through the vertex without intravenous contrast. COMPARISON:  Brain MRI 02/07/2017 and earlier. FINDINGS: Brain: Stable cerebral volume since 2018. Gray-white matter differentiation is stable and within normal limits. No midline shift, ventriculomegaly, mass effect, evidence of mass lesion, intracranial hemorrhage or evidence of cortically based acute infarction. Vascular: Mild Calcified atherosclerosis at the skull base. No suspicious intracranial vascular hyperdensity. Skull: No acute osseous abnormality identified. Sinuses/Orbits: Visualized paranasal sinuses and mastoids are stable and well pneumatized. Other: Visualized orbits  and scalp soft tissues are within normal limits. IMPRESSION: Stable and negative noncontrast CT appearance of the brain. Electronically Signed   By: Genevie Ann M.D.   On: 03/07/2020 06:35   US RENAL  Result Date: 03/08/2020 CLINICAL DATA:  Pyelonephritis. EXAM: RENAL / URINARY TRACT ULTRASOUND COMPLETE COMPARISON:  Noncontrast abdominal CT yesterday. FINDINGS: Right Kidney: Renal measurements: 12.7 x 5.5 x 6.2 cm = volume: 227 mL. Echogenicity within normal limits. The tiny intrarenal stone on CT is not well visualized by ultrasound. No mass or hydronephrosis visualized. No perinephric fluid collection. Left Kidney: Renal measurements: 12.2 x 6.0 x 6.6 cm = volume: 252 mL. Echogenicity within normal limits. No mass or hydronephrosis visualized. No perinephric fluid collection. Bladder: Appears normal for degree of bladder distention. Only minimally distended, patient voided prior to the exam. Other: Incidental note of hepatic steatosis. IMPRESSION: 1. No hydronephrosis or obstructive uropathy. No perinephric collection. 2. Nonobstructing intrarenal right renal stone on CT yesterday is not resolved by ultrasound. Electronically Signed   By: Keith Rake M.D.   On: 03/08/2020 19:58    Microbiology: Recent Results (from the past 240 hour(s))  Culture, blood (Routine x 2)  Status: None (Preliminary result)   Collection Time: 03/06/20 10:33 PM   Specimen: BLOOD  Result Value Ref Range Status   Specimen Description   Final    BLOOD LEFT ANTECUBITAL Performed at [facility], [address], [place], [place] [address]    Special Requests   Final    BOTTLES DRAWN AEROBIC AND ANAEROBIC Blood Culture results may not be optimal due to an inadequate volume of blood received in culture bottles Performed at [facility], [address], [place], [place] [address]    Culture   Final    NO GROWTH 4 DAYS Performed at [facility], [address]  [place], [place] [address]    Report Status PENDING  Incomplete  Culture, blood (Routine x 2)     Status: None (Preliminary result)   Collection Time: 03/06/20 10:38 PM   Specimen: BLOOD  Result Value Ref Range Status   Specimen Description   Final    BLOOD BLOOD LEFT FOREARM Performed at [facility], [address] [place], [place] [address]    Special Requests   Final    BOTTLES DRAWN AEROBIC AND ANAEROBIC Blood Culture adequate volume Performed at [facility], [address] [place], [place] [address]    Culture   Final    NO GROWTH 4 DAYS Performed at [facility], [address] [place], [place] [address]    Report Status PENDING  Incomplete  Resp Panel by RT-PCR (Flu A&B, Covid) Nasopharyngeal Swab     Status: None   Collection Time: 03/06/20 11:10 PM   Specimen: Nasopharyngeal Swab; Nasopharyngeal(NP) swabs in vial transport medium  Result Value Ref Range Status   SARS Coronavirus 2 by RT PCR NEGATIVE NEGATIVE Final    Comment: (NOTE) SARS-CoV-2 target nucleic acids are NOT DETECTED.  The SARS-CoV-2 RNA is generally detectable in upper respiratory specimens during the acute phase of infection. The lowest concentration of SARS-CoV-2 viral copies this assay can detect is 138 copies/mL. A negative result does not preclude SARS-Cov-2 infection and should not be used as the sole basis for treatment or other patient management decisions. A negative result may occur with  improper specimen collection/handling, submission of specimen other than nasopharyngeal swab, presence of viral mutation(s) within the areas targeted by this assay, and inadequate number of viral copies(<138 copies/mL). A negative result must be combined with clinical observations, patient history, and epidemiological information. The expected result is Negative.  Fact Sheet for Patients:  BloggerCourse.com  Fact Sheet for Healthcare  Providers:  SeriousBroker.it  This test is no t yet approved or cleared by the [place] FDA and  has been authorized for detection and/or diagnosis of SARS-CoV-2 by FDA under an Emergency Use Authorization (EUA). This EUA will remain  in effect (meaning this test can be used) for the duration of the COVID-19 declaration under Section 564(b)(1) of the Act, 21 U.S.C.section 360bbb-3(b)(1), unless the authorization is terminated  or revoked sooner.       Influenza A by PCR NEGATIVE NEGATIVE Final   Influenza B by PCR NEGATIVE NEGATIVE Final    Comment: (NOTE) The Xpert Xpress SARS-CoV-2/FLU/RSV plus assay is intended as an aid in the diagnosis of influenza from Nasopharyngeal swab specimens and should not be used as a sole basis for treatment. Nasal washings and aspirates are unacceptable for Xpert Xpress SARS-CoV-2/FLU/RSV testing.  Fact Sheet for Patients: BloggerCourse.com  Fact Sheet for Healthcare Providers: SeriousBroker.it  This  test is not yet approved or cleared by the [place] and has been authorized for detection and/or diagnosis of SARS-CoV-2 by FDA under an Emergency Use Authorization (EUA). This EUA will remain in effect (meaning this test can be used) for the duration of the COVID-19 declaration under Section 564(b)(1) of the Act, 21 U.S.C. section 360bbb-3(b)(1), unless the authorization is terminated or revoked.  Performed at [facility], [place] [address], [place], [place] [address]   Culture, blood (routine x 2)     Status: None (Preliminary result)   Collection Time: 03/08/20  7:13 PM   Specimen: BLOOD  Result Value Ref Range Status   Specimen Description   Final    BLOOD LEFT ANTECUBITAL Performed at [place] [address] [place], [place] [address]    Special Requests   Final    BOTTLES DRAWN AEROBIC AND ANAEROBIC Blood  Culture adequate volume Performed at [place] [address] [place], [place] [address]    Culture   Final    NO GROWTH 2 DAYS Performed at [place] [address], [place], [place] [address]    Report Status PENDING  Incomplete  Culture, blood (routine x 2)     Status: None (Preliminary result)   Collection Time: 03/08/20  7:13 PM   Specimen: BLOOD RIGHT FOREARM  Result Value Ref Range Status   Specimen Description   Final    BLOOD RIGHT FOREARM Performed at [place] [address], [place], [place] [address]    Special Requests   Final    BOTTLES DRAWN AEROBIC AND ANAEROBIC Blood Culture adequate volume Performed at [place] [address] [place], [place] [address]    Culture   Final    NO GROWTH 2 DAYS Performed at [place] [address], [place], [place] [address]    Report Status PENDING  Incomplete  Culture, Urine     Status: None   Collection Time: 03/08/20  8:20 PM   Specimen: Urine, Random  Result Value Ref Range Status   Specimen Description   Final    URINE, RANDOM Performed at [place] [address], [place], [place] [address]    Special Requests   Final    NONE Performed at [facility], [place] [address] [place],  [address]    Culture   Final    NO GROWTH Performed at [facility], [place] [address] [place],  [address]    Report Status 03/10/2020 FINAL  Final     Labs: Basic Metabolic Panel: Recent Labs  Lab 03/06/20 2233 03/07/20 [id number] 03/09/20 0533 03/10/20 0549 03/11/20 0605  NA 131* 130* 139 138 141  K 4.5 4.9 3.6 3.5 3.5  CL 94* 96* 105 106 107  CO2 22 25 22 22 23   GLUCOSE 160* 153* 135* 141* 130*  BUN 24* 20 11 12 15   CREATININE 1.22* 1.00 0.76 0.80 0.88  CALCIUM 9.7 8.6* 8.6* 8.8* 9.0  MG  --   --  2.0 1.7 1.9   Liver Function Tests: Recent Labs  Lab 03/06/20 2233  03/09/20 0533 03/10/20 0549 03/11/20 0605  AST 25 21 31  43*  ALT 38 21 32 45*  ALKPHOS 97 61 61 61  BILITOT 0.7 0.3 0.4 0.3  PROT 8.1 6.1* 6.0* 6.2*  ALBUMIN 3.9 2.9* 2.8* 2.9*   No results for input(s): LIPASE, AMYLASE in  the last 168 hours. No results for input(s): AMMONIA in the last 168 hours. CBC: Recent Labs  Lab 03/06/20 2233 03/07/20 0638 03/09/20 0533 03/10/20 0549  WBC 13.8* 12.5* 7.8 7.6  NEUTROABS 11.2*  --  6.1 5.2  HGB 15.7* 12.4 11.7* 11.3*  HCT 46.3* 36.0 35.6* 34.5*  MCV 89.0 88.7 90.4 89.8  PLT 185 122* 139* 184   Cardiac Enzymes: No results for input(s): CKTOTAL, CKMB, CKMBINDEX, TROPONINI in the last 168 hours. BNP: BNP (last 3 results) No results for input(s): BNP in the last 8760 hours.  ProBNP (last 3 results) No results for input(s): PROBNP in the last 8760 hours.  CBG: Recent Labs  Lab 03/10/20 0714 03/10/20 1148 03/10/20 1638 03/10/20 2010 03/11/20 0811  GLUCAP 113* 126* 82 126* 125*       Signed:  Nita Sells MD   [facility] 03/11/2020, 10:09 AM

## 2020-03-12 LAB — CULTURE, BLOOD (ROUTINE X 2)
Culture: NO GROWTH
Culture: NO GROWTH
Special Requests: ADEQUATE

## 2020-03-14 LAB — CULTURE, BLOOD (ROUTINE X 2)
Culture: NO GROWTH
Culture: NO GROWTH
Special Requests: ADEQUATE
Special Requests: ADEQUATE

## 2020-12-06 NOTE — Progress Notes (Signed)
Anesthesia Review:  PCP: Cardiologist : Chest x-ray : 2V 03/06/2020  EKG : Echo : Stress test: Cardiac Cath :  Activity level:  Sleep Study/ CPAP : Fasting Blood Sugar :      / Checks Blood Sugar -- times a day:   Blood Thinner/ Instructions /Last Dose: ASA / Instructions/ Last Dose :  Dm- type  Hgba1c-

## 2020-12-07 NOTE — Progress Notes (Signed)
DUE TO COVID-19 ONLY ONE VISITOR IS ALLOWED TO COME WITH YOU AND STAY IN THE WAITING ROOM ONLY DURING PRE OP AND PROCEDURE DAY OF SURGERY.  2 VISITOR  MAY VISIT WITH YOU AFTER SURGERY IN YOUR PRIVATE ROOM DURING VISITING HOURS ONLY!  YOU NEED TO HAVE A COVID 19 TEST ON___ 12/16/2020 @_  @_from  8am-3pm _____, THIS TEST MUST BE DONE BEFORE SURGERY,  Covid test is done at [place], [place] [address].  This is a drive thru.  No appt required. Please see map.                 Your procedure is scheduled on:  12/20/2020   Report to [facility] Main  Entrance   Report to admitting at   [place] AM     Call this number if you have problems the morning of surgery [phone]    REMEMBER: NO  SOLID FOOD CANDY OR GUM AFTER MIDNIGHT. CLEAR LIQUIDS UNTIL  0415am        . NOTHING BY MOUTH EXCEPT CLEAR LIQUIDS UNTIL    0415am   . PLEASE FINISH  G2 [place]  DRINK PER SURGEON ORDER  WHICH NEEDS TO BE COMPLETED AT    0415am   .      CLEAR LIQUID DIET   Foods Allowed                                                                    Coffee and tea, regular and decaf                            Fruit ices (not with fruit pulp)                                      Iced Popsicles                                    Carbonated beverages, regular and diet                                    Cranberry, grape and apple juices Sports drinks like Gatorade Lightly seasoned clear broth or consume(fat free) Sugar, honey syrup ___________________________________________________________________      BRUSH YOUR TEETH MORNING OF SURGERY AND RINSE YOUR MOUTH OUT, NO CHEWING GUM CANDY OR MINTS.     Take these medicines the morning of surgery with A SIP OF WATER:   Trintellix, wellbutrin, buspar, metoprolol, prilosec  DO NOT TAKE ANY DIABETIC MEDICATIONS DAY OF YOUR SURGERY                               You may not have any metal on your body including hair pins and              piercings   Do not wear jewelry, make-up, lotions, powders or perfumes, deodorant  Do not wear nail polish on your fingernails.  Do not shave  48 hours prior to surgery.              Men may shave face and neck.   Do not bring valuables to the hospital. [place].  Contacts, dentures or bridgework may not be worn into surgery.  Leave suitcase in the car. After surgery it may be brought to your room.     Patients discharged the day of surgery will not be allowed to drive home. IF YOU ARE HAVING SURGERY AND GOING HOME THE SAME DAY, YOU MUST HAVE AN ADULT TO DRIVE YOU HOME AND BE WITH YOU FOR 24 HOURS. YOU MAY GO HOME BY TAXI OR UBER OR ORTHERWISE, BUT AN ADULT MUST ACCOMPANY YOU HOME AND STAY WITH YOU FOR 24 HOURS.  Name and phone number of your driver:  Special Instructions: N/A              Please read over the following fact sheets you were given: _____________________________________________________________________  [facility] - Preparing for Surgery Before surgery, you can play an important role.  Because skin is not sterile, your skin needs to be as free of germs as possible.  You can reduce the number of germs on your skin by washing with CHG (chlorahexidine gluconate) soap before surgery.  CHG is an antiseptic cleaner which kills germs and bonds with the skin to continue killing germs even after washing. Please DO NOT use if you have an allergy to CHG or antibacterial soaps.  If your skin becomes reddened/irritated stop using the CHG and inform your nurse when you arrive at Short Stay. Do not shave (including legs and underarms) for at least 48 hours prior to the first CHG shower.  You may shave your face/neck. Please follow these instructions carefully:  1.  Shower with CHG Soap the night before surgery and the  morning of Surgery.  2.  If you choose to wash your hair, wash your hair first as usual with your  normal  shampoo.  3.  After  you shampoo, rinse your hair and body thoroughly to remove the  shampoo.                           4.  Use CHG as you would any other liquid soap.  You can apply chg directly  to the skin and wash                       Gently with a scrungie or clean washcloth.  5.  Apply the CHG Soap to your body ONLY FROM THE NECK DOWN.   Do not use on face/ open                           Wound or open sores. Avoid contact with eyes, ears mouth and genitals (private parts).                       Wash face,  Genitals (private parts) with your normal soap.             6.  Wash thoroughly, paying special attention to the area where your surgery  will be performed.  7.  Thoroughly rinse your body with  warm water from the neck down.  8.  DO NOT shower/wash with your normal soap after using and rinsing off  the CHG Soap.                9.  Pat yourself dry with a clean towel.            10.  Wear clean pajamas.            11.  Place clean sheets on your bed the night of your first shower and do not  sleep with pets. Day of Surgery : Do not apply any lotions/deodorants the morning of surgery.  Please wear clean clothes to the hospital/surgery center.  FAILURE TO FOLLOW THESE INSTRUCTIONS MAY RESULT IN THE CANCELLATION OF YOUR SURGERY PATIENT SIGNATURE_________________________________  NURSE SIGNATURE__________________________________  ________________________________________________________________________

## 2020-12-08 ENCOUNTER — Encounter (HOSPITAL_COMMUNITY)
Admission: RE | Admit: 2020-12-08 | Discharge: 2020-12-08 | Disposition: A | Discharge status: Home / Self Care | Payer: No Typology Code available for payment source | Source: Ambulatory Visit | Attending: Anesthesiology | Admitting: Anesthesiology

## 2020-12-08 NOTE — Progress Notes (Signed)
Called pt at 103pm when did not show for preop appt. LVMM.  Called again at 1320 pm.  Pt states she did not schedule this appt and she called and cancelled this preop appt because she stated noone scheduled this appt with her.  Nurse informed pt that someone would call her to reschedule.

## 2020-12-08 NOTE — H&P (Signed)
TOTAL KNEE ADMISSION H&P  Patient is being admitted for left total knee arthroplasty.  Subjective:  Chief Complaint:left knee pain.  HPI: [name], 55 y.o. female, has a history of pain and functional disability in the left knee due to arthritis and has failed non-surgical conservative treatments for greater than 12 weeks to includeNSAID's and/or analgesics, corticosteriod injections, and activity modification.  Onset of symptoms was gradual, starting 2 years ago with gradually worsening course since that time. The patient noted prior procedures on the knee to include  arthroscopy and menisectomy on the left knee(s).  Patient currently rates pain in the left knee(s) at 7 out of 10 with activity. Patient has worsening of pain with activity and weight bearing and pain that interferes with activities of daily living.  Patient has evidence of joint space narrowing by imaging studies. There is no active infection.  Patient Active Problem List   Diagnosis Date Noted   UTI (urinary tract infection) 03/07/2020   Diabetes mellitus type 2, noninsulin dependent ([place]) 03/07/2020   Sepsis ([place]) 03/07/2020   Other malaise and fatigue 07/29/2013   Depression 07/29/2013   GERD (gastroesophageal reflux disease) 03/31/2013   History of left hemicolectomy 02/10/2013   History of Clostridium difficile colitis 02/10/2013   Left sided abdominal pain 02/10/2013   Renal stones 02/10/2013   History of diverticulitis of colon 02/10/2013   Unspecified constipation 01/07/2013   Right ureteral stone 09/14/2012   Past Medical History:  Diagnosis Date   ADHD (attention deficit hyperactivity disorder)    ADHD (attention deficit hyperactivity disorder)    C. difficile diarrhea    Chronic back pain    Depression    Difficulty sleeping    Diverticulosis    GERD (gastroesophageal reflux disease)    Hiatal hernia    Nephrolithiasis    Palpitations    PONV (postoperative nausea and vomiting)    Psoriasis     Right ureteral stone 09/14/2012   Tubular adenoma    colonoscopy 03/2013   UTI (lower urinary tract infection)     Past Surgical History:  Procedure Laterality Date   ANTERIOR CRUCIATE LIGAMENT REPAIR     ANTERIOR CRUCIATE LIGAMENT REPAIR     CHOLECYSTECTOMY     COLONOSCOPY  03/2013   CYSTOSCOPY WITH RETROGRADE PYELOGRAM, URETEROSCOPY AND STENT PLACEMENT     CYSTOSCOPY WITH RETROGRADE PYELOGRAM, URETEROSCOPY AND STENT PLACEMENT     CYSTOSCOPY WITH RETROGRADE PYELOGRAM, URETEROSCOPY AND STENT PLACEMENT     CYSTOSCOPY WITH URETEROSCOPY N/A 09/14/2012   Procedure: CYSTOSCOPY WITH URETEROSCOPY, with  stent placement;  Surgeon: Malka So, MD;  Location: [facility];  Service: Urology;  Laterality: N/A;   CYSTOSCOPY/RETROGRADE/URETEROSCOPY/STONE EXTRACTION WITH BASKET Left 11/10/2013   Procedure: LEFT URETEROSCOPY, CYSTOSCOPY;  Surgeon: Malka So, MD;  Location: [facility];  Service: Urology;  Laterality: Left;   PARTIAL COLECTOMY     WRIST SURGERY      No current facility-administered medications for this encounter.   Current Outpatient Medications  Medication Sig Dispense Refill Last Dose   acetaminophen (TYLENOL) 650 MG CR tablet Take 1,300 mg by mouth 2 (two) times daily.      buPROPion (WELLBUTRIN XL) 300 MG 24 hr tablet Take 300 mg by mouth daily.      busPIRone (BUSPAR) 15 MG tablet Take 15 mg by mouth in the morning and at bedtime.      conjugated estrogens (PREMARIN) vaginal cream Place 1 applicator vaginally 2 (two) times a week.  diclofenac (VOLTAREN) 75 MG EC tablet Take 75 mg by mouth 2 (two) times daily.      hydrocortisone 2.5 % cream Apply 1 application topically 2 (two) times daily as needed (irritation).      Melatonin 10 MG CAPS Take 10 mg by mouth at bedtime as needed (sleep).      metFORMIN (GLUCOPHAGE) 500 MG tablet Take 500 mg by mouth 2 (two) times daily.      methylphenidate 27 MG PO CR tablet Take 27 mg by mouth daily.      metoprolol tartrate (LOPRESSOR) 50 MG  tablet Take 75 mg by mouth in the morning and at bedtime.      nortriptyline (PAMELOR) 50 MG capsule Take 50 mg by mouth at bedtime.      Omega 3 1200 MG CAPS Take 1,200 mg by mouth 2 (two) times daily.      omeprazole (PRILOSEC) 40 MG capsule Take 40 mg by mouth 2 (two) times daily.      traMADol (ULTRAM) 50 MG tablet Take 50 mg by mouth 2 (two) times daily as needed for moderate pain.      vortioxetine HBr (TRINTELLIX) 20 MG TABS tablet Take 20 mg by mouth daily.      Allergies  Allergen Reactions   Sulfa Antibiotics Hives and Swelling    Face swelling   Amoxicillin-Pot Clavulanate Diarrhea    Other reaction(s): Myalgias (intolerance), Other (See Comments)    Ciprofloxacin Other (See Comments)    Joint inflammation   Donepezil     Other reaction(s): Arthralgias (intolerance)   Flagyl [Metronidazole] Other (See Comments)    Altered mental state   Ivp Dye [Iodinated Diagnostic Agents] Hives    Specifically for ct scans, tolerates mri dye   Other Itching    Narcotic pain medicines cause itching but can be managed with benadryl   Metoclopramide Palpitations    Social History   Tobacco Use   Smoking status: Never   Smokeless tobacco: Never  Substance Use Topics   Alcohol use: Yes    Alcohol/week: 0.0 - 1.0 standard drinks    Comment: rarely    Family History  Problem Relation Age of Onset   Osteoarthritis Mother    Arthritis Mother    Asthma Mother    Osteoarthritis Maternal Grandmother    Osteoarthritis Maternal Grandfather    ADD / ADHD Son    ADD / ADHD Daughter    Colon cancer Neg Hx      Review of Systems  Constitutional:  Negative for chills and fever.  Respiratory:  Negative for cough and shortness of breath.   Cardiovascular:  Negative for chest pain.  Gastrointestinal:  Negative for nausea and vomiting.  Musculoskeletal:  Positive for arthralgias.    Objective:  Physical Exam Well nourished and well developed. General: Alert and oriented x3,  cooperative and pleasant, no acute distress. Head: normocephalic, atraumatic, neck supple. Eyes: EOMI.  Musculoskeletal: Left knee exam: Mild effusion noted without signs of infection Portal sites are healed Tenderness over the medial and anterior aspect the knee Slight flexion contracture due to pain with flexion to 120 degrees with tightness and pain Stable medial lateral collateral ligaments  Calves soft and nontender. Motor function intact in LE. Strength 5/5 LE bilaterally. Neuro: Distal pulses 2+. Sensation to light touch intact in LE.  Vital signs in last 24 hours: BP: ()/()  Arterial Line BP: ()/()   Labs:   Estimated body mass index is 34.78 kg/m as calculated from  the following:   Height as of 03/06/20: 5\' 5"  (1.651 m).   Weight as of 03/08/20: 94.8 kg.   Imaging Review Plain radiographs demonstrate severe degenerative joint disease of the left knee(s). The overall alignment isneutral. The bone quality appears to be adequate for age and reported activity level.      Assessment/Plan:  End stage arthritis, left knee   The patient history, physical examination, clinical judgment of the provider and imaging studies are consistent with end stage degenerative joint disease of the left knee(s) and total knee arthroplasty is deemed medically necessary. The treatment options including medical management, injection therapy arthroscopy and arthroplasty were discussed at length. The risks and benefits of total knee arthroplasty were presented and reviewed. The risks due to aseptic loosening, infection, stiffness, patella tracking problems, thromboembolic complications and other imponderables were discussed. The patient acknowledged the explanation, agreed to proceed with the plan and consent was signed. Patient is being admitted for inpatient treatment for surgery, pain control, PT, OT, prophylactic antibiotics, VTE prophylaxis, progressive ambulation and ADL's and discharge  planning. The patient is planning to be discharged  home.  Therapy Plans: outpatient therapy Disposition: Home with daughter, mother, husband (works in [place] during the weeks) Planned DVT Prophylaxis: aspirin 81mg  BID DME needed: walker, 3-n-1 PCP: Dr. Nyoka Cowden, clearance received TXA: IV Allergies: Cipro - tendonitis, flagyl - made her feel crazy, sulfa - hives, contrast - hives Anesthesia Concerns: nausea/vomiting BMI: 35.2 Last HgbA1c: unknown (~7%)  Other: - Oxycodone, meloxicam/toradol, robaxin, zofran - COVID in the summer   Patient's anticipated LOS is less than 2 midnights, meeting these requirements: - Younger than 10 - Lives within 1 hour of care - Has a competent adult at home to recover with post-op recover - NO history of  - Chronic pain requiring opiods  - Diabetes  - Coronary Artery Disease  - Heart failure  - Heart attack  - Stroke  - DVT/VTE  - Cardiac arrhythmia  - Respiratory Failure/COPD  - Renal failure  - Anemia  - Advanced Liver disease  Griffith Citron, PA-C Orthopedic Surgery [facility] [place] [phone]

## 2020-12-15 NOTE — Progress Notes (Addendum)
COVID swab appointment: 12/16/20  COVID Vaccine Completed: yes x1 Date COVID Vaccine completed: Has received booster: COVID vaccine manufacturer: [employer]   Date of COVID positive in last 90 days: no  PCP - [facility]- Dr Carlota Raspberry Cardiologist - n/a  Chest x-ray - 03/06/20 Epic EKG - 12/16/20 Epic Stress Test - before 2013 ECHO - before 2013 Cardiac Cath - prior to 2013 Pacemaker/ICD device last checked: n/a Spinal Cord Stimulator: n/a  Sleep Study - yes positive sleep apnea CPAP - yes every night  Fasting Blood Sugar - 105-250 Checks Blood Sugar _0-3_ times a day  Blood Thinner Instructions: n/a Aspirin Instructions: Last Dose:  Activity level: Can go up a flight of stairs and perform activities of daily living without stopping and without symptoms of chest pain or shortness of breath.       Anesthesia review:   Patient denies shortness of breath, fever, cough and chest pain at PAT appointment   Patient verbalized understanding of instructions that were given to them at the PAT appointment. Patient was also instructed that they will need to review over the PAT instructions again at home before surgery.

## 2020-12-15 NOTE — Patient Instructions (Addendum)
DUE TO COVID-19 ONLY ONE VISITOR IS ALLOWED TO COME WITH YOU AND STAY IN THE WAITING ROOM ONLY DURING PRE OP AND PROCEDURE.   **NO VISITORS ARE ALLOWED IN THE SHORT STAY AREA OR RECOVERY ROOM!!**  IF YOU WILL BE ADMITTED INTO THE HOSPITAL YOU ARE ALLOWED ONLY TWO SUPPORT PEOPLE DURING VISITATION HOURS ONLY (10AM -8PM)   The support person(s) may change daily. The support person(s) must pass our screening, gel in and out, and wear a mask at all times, including in the patient's room. Patients must also wear a mask when staff or their support person are in the room.  No visitors under the age of 45. Any visitor under the age of 12 must be accompanied by an adult.    COVID SWAB TESTING MUST BE COMPLETED ON:  12/16/20 **MUST PRESENT COMPLETED FORM AT TESTING SITE**    [name] (backside of the building) No appointment needed. Open 8am-3pm You are not required to quarantine, however you are required to wear a well-fitted mask when you are out and around people not in your household.  Hand Hygiene often Do NOT share personal items Notify your provider if you are in close contact with someone who has COVID or you develop fever 100.4 or greater, new onset of sneezing, cough, sore throat, shortness of breath or body aches.       Your procedure is scheduled on: 12/20/20   Report to [facility] Main Entrance   Report to Short Stay at 5:15 AM   [facility])   Call this number if you have problems the morning of surgery [phone]   Do not eat food :After Midnight.   May have liquids until 4:15 AM day of surgery  CLEAR LIQUID DIET  Foods Allowed                                                                     Foods Excluded  Water, Black Coffee and tea (no milk or creamer)            liquids that you cannot  Plain Jell-O in any flavor  (No red)                                    see through such as: Fruit ices (not with fruit pulp)                                             milk, soups, orange juice              Iced Popsicles (No red)                                               All solid food  Apple juices Sports drinks like Gatorade (No red) Lightly seasoned clear broth or consume(fat free) Sugar      The day of surgery:  Drink ONE (1) Pre-Surgery G2 by 4:15 am the morning of surgery. Drink in one sitting. Do not sip.  This drink was given to you during your hospital  pre-op appointment visit. Nothing else to drink after completing the  Pre-Surgery G2.          If you have questions, please contact your surgeon's office.     Oral Hygiene is also important to reduce your risk of infection.                                    Remember - BRUSH YOUR TEETH THE MORNING OF SURGERY WITH YOUR REGULAR TOOTHPASTE   Take these medicines the morning of surgery with A SIP OF WATER: Tylenol, Wellbutrin, Buspar, Metoprolol, Prilosec, Tramadol.   DO NOT TAKE ANY ORAL DIABETIC MEDICATIONS DAY OF YOUR SURGERY  How to Manage Your Diabetes Before and After Surgery  Why is it important to control my blood sugar before and after surgery? Improving blood sugar levels before and after surgery helps healing and can limit problems. A way of improving blood sugar control is eating a healthy diet by:  Eating less sugar and carbohydrates  Increasing activity/exercise  Talking with your doctor about reaching your blood sugar goals High blood sugars (greater than 180 mg/dL) can raise your risk of infections and slow your recovery, so you will need to focus on controlling your diabetes during the weeks before surgery. Make sure that the doctor who takes care of your diabetes knows about your planned surgery including the date and location.  How do I manage my blood sugar before surgery? Check your blood sugar at least 4 times a day, starting 2 days before surgery, to make sure that the level is not too high or low. Check  your blood sugar the morning of your surgery when you wake up and every 2 hours until you get to the Short Stay unit. If your blood sugar is less than 70 mg/dL, you will need to treat for low blood sugar: Do not take insulin. Treat a low blood sugar (less than 70 mg/dL) with  cup of clear juice (cranberry or apple), 4 glucose tablets, OR glucose gel. Recheck blood sugar in 15 minutes after treatment (to make sure it is greater than 70 mg/dL). If your blood sugar is not greater than 70 mg/dL on recheck, call [phone] for further instructions. Report your blood sugar to the short stay nurse when you get to Short Stay.  If you are admitted to the hospital after surgery: Your blood sugar will be checked by the staff and you will probably be given insulin after surgery (instead of oral diabetes medicines) to make sure you have good blood sugar levels. The goal for blood sugar control after surgery is 80-180 mg/dL.   WHAT DO I DO ABOUT MY DIABETES MEDICATION?  Do not take oral diabetes medicines (pills) the morning of surgery.  THE DAY BEFORE SURGERY, take Metformin as prescribed       THE MORNING OF SURGERY, do not take Metformin   Reviewed and Endorsed by [facility] Patient Education Committee, August 2015  You may not have any metal on your body including hair pins, jewelry, and body piercing             Do not wear make-up, lotions, powders, perfumes, or deodorant  Do not wear nail polish including gel and S&S, artificial/acrylic nails, or any other type of covering on natural nails including finger and toenails. If you have artificial nails, gel coating, etc. that needs to be removed by a nail salon please have this removed prior to surgery or surgery may need to be canceled/ delayed if the surgeon/ anesthesia feels like they are unable to be safely monitored.   Do not shave  48 hours prior to surgery.    Do not bring valuables to the hospital. [place].   Contacts, dentures or bridgework may not be worn into surgery.   Bring small overnight bag day of surgery.   Please read over the following fact sheets you were given: IF YOU HAVE QUESTIONS ABOUT YOUR PRE-OP INSTRUCTIONS PLEASE CALL [place] - Preparing for Surgery Before surgery, you can play an important role.  Because skin is not sterile, your skin needs to be as free of germs as possible.  You can reduce the number of germs on your skin by washing with CHG (chlorahexidine gluconate) soap before surgery.  CHG is an antiseptic cleaner which kills germs and bonds with the skin to continue killing germs even after washing. Please DO NOT use if you have an allergy to CHG or antibacterial soaps.  If your skin becomes reddened/irritated stop using the CHG and inform your nurse when you arrive at Short Stay. Do not shave (including legs and underarms) for at least 48 hours prior to the first CHG shower.  You may shave your face/neck.  Please follow these instructions carefully:  1.  Shower with CHG Soap the night before surgery and the  morning of surgery.  2.  If you choose to wash your hair, wash your hair first as usual with your normal  shampoo.  3.  After you shampoo, rinse your hair and body thoroughly to remove the shampoo.                             4.  Use CHG as you would any other liquid soap.  You can apply chg directly to the skin and wash.  Gently with a scrungie or clean washcloth.  5.  Apply the CHG Soap to your body ONLY FROM THE NECK DOWN.   Do   not use on face/ open                           Wound or open sores. Avoid contact with eyes, ears mouth and   genitals (private parts).                       Wash face,  Genitals (private parts) with your normal soap.             6.  Wash thoroughly, paying special attention to the area where your    surgery  will be performed.  7.  Thoroughly rinse your  body with warm water from the neck down.  8.  DO NOT shower/wash with your normal  soap after using and rinsing off the CHG Soap.                9.  Pat yourself dry with a clean towel.            10.  Wear clean pajamas.            11.  Place clean sheets on your bed the night of your first shower and do not  sleep with pets. Day of Surgery : Do not apply any lotions/deodorants the morning of surgery.  Please wear clean clothes to the hospital/surgery center.  FAILURE TO FOLLOW THESE INSTRUCTIONS MAY RESULT IN THE CANCELLATION OF YOUR SURGERY  PATIENT SIGNATURE_________________________________  NURSE SIGNATURE__________________________________  ________________________________________________________________________   [name]  An incentive spirometer is a tool that can help keep your lungs clear and active. This tool measures how well you are filling your lungs with each breath. Taking long deep breaths may help reverse or decrease the chance of developing breathing (pulmonary) problems (especially infection) following: A long period of time when you are unable to move or be active. BEFORE THE PROCEDURE  If the spirometer includes an indicator to show your best effort, your nurse or respiratory therapist will set it to a desired goal. If possible, sit up straight or lean slightly forward. Try not to slouch. Hold the incentive spirometer in an upright position. INSTRUCTIONS FOR USE  Sit on the edge of your bed if possible, or sit up as far as you can in bed or on a chair. Hold the incentive spirometer in an upright position. Breathe out normally. Place the mouthpiece in your mouth and seal your lips tightly around it. Breathe in slowly and as deeply as possible, raising the piston or the ball toward the top of the column. Hold your breath for 3-5 seconds or for as long as possible. Allow the piston or ball to fall to the bottom of the column. Remove the mouthpiece from your  mouth and breathe out normally. Rest for a few seconds and repeat Steps 1 through 7 at least 10 times every 1-2 hours when you are awake. Take your time and take a few normal breaths between deep breaths. The spirometer may include an indicator to show your best effort. Use the indicator as a goal to work toward during each repetition. After each set of 10 deep breaths, practice coughing to be sure your lungs are clear. If you have an incision (the cut made at the time of surgery), support your incision when coughing by placing a pillow or rolled up towels firmly against it. Once you are able to get out of bed, walk around indoors and cough well. You may stop using the incentive spirometer when instructed by your caregiver.  RISKS AND COMPLICATIONS Take your time so you do not get dizzy or light-headed. If you are in pain, you may need to take or ask for pain medication before doing incentive spirometry. It is harder to take a deep breath if you are having pain. AFTER USE Rest and breathe slowly and easily. It can be helpful to keep track of a log of your progress. Your caregiver can provide you with a simple table to help with this. If you are using the spirometer at home, follow these instructions: [place] IF:  You are having difficultly using the spirometer. You have trouble using the spirometer as often as instructed. Your pain medication is not giving enough relief while using the  spirometer. You develop fever of 100.5 F (38.1 C) or higher. SEEK IMMEDIATE MEDICAL CARE IF:  You cough up bloody sputum that had not been present before. You develop fever of 102 F (38.9 C) or greater. You develop worsening pain at or near the incision site. MAKE SURE YOU:  Understand these instructions. Will watch your condition. Will get help right away if you are not doing well or get worse. Document Released: 07/09/2006 Document Revised: 05/21/2011 Document Reviewed: 09/09/2006 ExitCare  Patient Information 2014 [employer], [place].   ________________________________________________________________________  WHAT IS A BLOOD TRANSFUSION? Blood Transfusion Information  A transfusion is the replacement of blood or some of its parts. Blood is made up of multiple cells which provide different functions. Red blood cells carry oxygen and are used for blood loss replacement. White blood cells fight against infection. Platelets control bleeding. Plasma helps clot blood. Other blood products are available for specialized needs, such as hemophilia or other clotting disorders. BEFORE THE TRANSFUSION  Who gives blood for transfusions?  Healthy volunteers who are fully evaluated to make sure their blood is safe. This is blood bank blood. Transfusion therapy is the safest it has ever been in the practice of medicine. Before blood is taken from a donor, a complete history is taken to make sure that person has no history of diseases nor engages in risky social behavior (examples are intravenous drug use or sexual activity with multiple partners). The donor's travel history is screened to minimize risk of transmitting infections, such as malaria. The donated blood is tested for signs of infectious diseases, such as HIV and hepatitis. The blood is then tested to be sure it is compatible with you in order to minimize the chance of a transfusion reaction. If you or a relative donates blood, this is often done in anticipation of surgery and is not appropriate for emergency situations. It takes many days to process the donated blood. RISKS AND COMPLICATIONS Although transfusion therapy is very safe and saves many lives, the main dangers of transfusion include:  Getting an infectious disease. Developing a transfusion reaction. This is an allergic reaction to something in the blood you were given. Every precaution is taken to prevent this. The decision to have a blood transfusion has been considered carefully  by your caregiver before blood is given. Blood is not given unless the benefits outweigh the risks. AFTER THE TRANSFUSION Right after receiving a blood transfusion, you will usually feel much better and more energetic. This is especially true if your red blood cells have gotten low (anemic). The transfusion raises the level of the red blood cells which carry oxygen, and this usually causes an energy increase. The nurse administering the transfusion will monitor you carefully for complications. HOME CARE INSTRUCTIONS  No special instructions are needed after a transfusion. You may find your energy is better. Speak with your caregiver about any limitations on activity for underlying diseases you may have. SEEK MEDICAL CARE IF:  Your condition is not improving after your transfusion. You develop redness or irritation at the intravenous (IV) site. SEEK IMMEDIATE MEDICAL CARE IF:  Any of the following symptoms occur over the next 12 hours: Shaking chills. You have a temperature by mouth above 102 F (38.9 C), not controlled by medicine. Chest, back, or muscle pain. People around you feel you are not acting correctly or are confused. Shortness of breath or difficulty breathing. Dizziness and fainting. You get a rash or develop hives. You have a decrease in urine output.  Your urine turns a dark color or changes to pink, red, or brown. Any of the following symptoms occur over the next 10 days: You have a temperature by mouth above 102 F (38.9 C), not controlled by medicine. Shortness of breath. Weakness after normal activity. The white part of the eye turns yellow (jaundice). You have a decrease in the amount of urine or are urinating less often. Your urine turns a dark color or changes to pink, red, or brown. Document Released: 02/24/2000 Document Revised: 05/21/2011 Document Reviewed: 10/13/2007 [facility] Patient Information 2014 [place],  [place].  _______________________________________________________________________

## 2020-12-16 ENCOUNTER — Other Ambulatory Visit: Payer: Self-pay | Admitting: Orthopedic Surgery

## 2020-12-16 ENCOUNTER — Encounter (HOSPITAL_COMMUNITY): Payer: Self-pay

## 2020-12-16 ENCOUNTER — Other Ambulatory Visit: Payer: Self-pay

## 2020-12-16 ENCOUNTER — Encounter (HOSPITAL_COMMUNITY)
Admission: RE | Admit: 2020-12-16 | Discharge: 2020-12-16 | Disposition: A | Discharge status: Home / Self Care | Payer: No Typology Code available for payment source | Source: Ambulatory Visit | Attending: Orthopedic Surgery | Admitting: Orthopedic Surgery

## 2020-12-16 DIAGNOSIS — Z01818 Encounter for other preprocedural examination: Secondary | ICD-10-CM | POA: Insufficient documentation

## 2020-12-16 DIAGNOSIS — Z20822 Contact with and (suspected) exposure to covid-19: Secondary | ICD-10-CM | POA: Insufficient documentation

## 2020-12-16 HISTORY — DX: Type 2 diabetes mellitus without complications: E11.9

## 2020-12-16 HISTORY — DX: Umbilical hernia without obstruction or gangrene: K42.9

## 2020-12-16 HISTORY — DX: Essential (primary) hypertension: I10

## 2020-12-16 HISTORY — DX: Unspecified osteoarthritis, unspecified site: M19.90

## 2020-12-16 HISTORY — DX: Sleep apnea, unspecified: G47.30

## 2020-12-16 LAB — CBC
HCT: 44.1 % (ref 36.0–46.0)
Hemoglobin: 14.4 g/dL (ref 12.0–15.0)
MCH: 30.2 pg (ref 26.0–34.0)
MCHC: 32.7 g/dL (ref 30.0–36.0)
MCV: 92.5 fL (ref 80.0–100.0)
Platelets: 208 10*3/uL (ref 150–400)
RBC: 4.77 MIL/uL (ref 3.87–5.11)
RDW: 12.8 % (ref 11.5–15.5)
WBC: 7.2 10*3/uL (ref 4.0–10.5)
nRBC: 0 % (ref 0.0–0.2)

## 2020-12-16 LAB — COMPREHENSIVE METABOLIC PANEL
ALT: 59 U/L — ABNORMAL HIGH (ref 0–44)
AST: 45 U/L — ABNORMAL HIGH (ref 15–41)
Albumin: 4.4 g/dL (ref 3.5–5.0)
Alkaline Phosphatase: 71 U/L (ref 38–126)
Anion gap: 11 (ref 5–15)
BUN: 17 mg/dL (ref 6–20)
CO2: 23 mmol/L (ref 22–32)
Calcium: 9.8 mg/dL (ref 8.9–10.3)
Chloride: 102 mmol/L (ref 98–111)
Creatinine, Ser: 0.91 mg/dL (ref 0.44–1.00)
GFR, Estimated: >60 mL/min (ref >60)
Glucose, Bld: 167 mg/dL — ABNORMAL HIGH (ref 70–99)
Potassium: 4.3 mmol/L (ref 3.5–5.1)
Sodium: 136 mmol/L (ref 135–145)
Total Bilirubin: 0.5 mg/dL (ref 0.3–1.2)
Total Protein: 7.4 g/dL (ref 6.5–8.1)

## 2020-12-16 LAB — GLUCOSE, CAPILLARY: Glucose-Capillary: 188 mg/dL — ABNORMAL HIGH (ref 70–99)

## 2020-12-16 LAB — SURGICAL PCR SCREEN
MRSA, PCR: NEGATIVE
Staphylococcus aureus: POSITIVE — AB

## 2020-12-16 LAB — HEMOGLOBIN A1C
Hgb A1c MFr Bld: 6.8 % — ABNORMAL HIGH (ref 4.8–5.6)
Mean Plasma Glucose: 148.46 mg/dL

## 2020-12-16 NOTE — Progress Notes (Signed)
PCR came back [facility] Results routed to Dr. Alvan Dame

## 2020-12-17 LAB — SARS CORONAVIRUS 2 (TAT 6-24 HRS): SARS Coronavirus 2: NEGATIVE

## 2020-12-19 ENCOUNTER — Encounter (HOSPITAL_COMMUNITY): Payer: Self-pay | Admitting: Orthopedic Surgery

## 2020-12-19 NOTE — Anesthesia Preprocedure Evaluation (Addendum)
Anesthesia Evaluation  Patient identified by MRN, date of birth, ID band Patient awake    Reviewed: Allergy & Precautions, NPO status , Patient's Chart, lab work & pertinent test results, reviewed documented beta blocker date and time   History of Anesthesia Complications (+) PONV and history of anesthetic complications  Airway Mallampati: II  TM Distance: >3 FB Neck ROM: Full    Dental  (+) Teeth Intact, Dental Advisory Given   Pulmonary sleep apnea and Continuous Positive Airway Pressure Ventilation ,    Pulmonary exam normal breath sounds clear to auscultation       Cardiovascular hypertension, Pt. on medications Normal cardiovascular exam Rhythm:Regular Rate:Normal  EKG 12/16/20 NSR   Neuro/Psych PSYCHIATRIC DISORDERS Depression Depression   GI/Hepatic Neg liver ROS, GERD  Medicated and Controlled,Hx/o diverticulosis S/P left hemicolectomy   Endo/Other  diabetes, Well Controlled, Type 2, Oral Hypoglycemic AgentsObesity Last HbA1c 6.8 on 9/28  Renal/GU Renal diseaseHx/o renal calculi  negative genitourinary   Musculoskeletal  (+) Arthritis , Osteoarthritis,  OA left knee Chronic back pain Psoriasis   Abdominal (+) + obese,   Peds  Hematology negative hematology ROS (+)   Anesthesia Other Findings   Reproductive/Obstetrics                            Anesthesia Physical Anesthesia Plan  ASA: 3  Anesthesia Plan: Spinal   Post-op Pain Management:  Regional for Post-op pain   Induction: Intravenous  PONV Risk Score and Plan: 4 or greater and Treatment may vary due to age or medical condition, Scopolamine patch - Pre-op and Ondansetron  Airway Management Planned: Natural Airway and Simple Face Mask  Additional Equipment:   Intra-op Plan:   Post-operative Plan:   Informed Consent: I have reviewed the patients History and Physical, chart, labs and discussed the procedure  including the risks, benefits and alternatives for the proposed anesthesia with the patient or authorized representative who has indicated his/her understanding and acceptance.     Dental advisory given  Plan Discussed with: CRNA and Anesthesiologist  Anesthesia Plan Comments:        Anesthesia Quick Evaluation

## 2020-12-20 ENCOUNTER — Observation Stay (HOSPITAL_COMMUNITY)
Admission: RE | Admit: 2020-12-20 | Discharge: 2020-12-21 | Disposition: A | Discharge status: Home / Self Care | Payer: No Typology Code available for payment source | Attending: Orthopedic Surgery | Admitting: Orthopedic Surgery

## 2020-12-20 ENCOUNTER — Ambulatory Visit (HOSPITAL_COMMUNITY): Payer: No Typology Code available for payment source | Admitting: Certified Registered Nurse Anesthetist

## 2020-12-20 ENCOUNTER — Encounter (HOSPITAL_COMMUNITY): Payer: Self-pay | Admitting: Orthopedic Surgery

## 2020-12-20 ENCOUNTER — Encounter (HOSPITAL_COMMUNITY)
Admission: RE | Disposition: A | Discharge status: Home / Self Care | Payer: Self-pay | Source: Home / Self Care | Attending: Orthopedic Surgery

## 2020-12-20 ENCOUNTER — Other Ambulatory Visit: Payer: Self-pay

## 2020-12-20 DIAGNOSIS — Z7984 Long term (current) use of oral hypoglycemic drugs: Secondary | ICD-10-CM | POA: Diagnosis not present

## 2020-12-20 DIAGNOSIS — E119 Type 2 diabetes mellitus without complications: Secondary | ICD-10-CM | POA: Insufficient documentation

## 2020-12-20 DIAGNOSIS — Z96659 Presence of unspecified artificial knee joint: Secondary | ICD-10-CM

## 2020-12-20 DIAGNOSIS — Z79899 Other long term (current) drug therapy: Secondary | ICD-10-CM | POA: Insufficient documentation

## 2020-12-20 DIAGNOSIS — M1712 Unilateral primary osteoarthritis, left knee: Principal | ICD-10-CM | POA: Insufficient documentation

## 2020-12-20 DIAGNOSIS — I10 Essential (primary) hypertension: Secondary | ICD-10-CM | POA: Diagnosis not present

## 2020-12-20 DIAGNOSIS — Z96652 Presence of left artificial knee joint: Secondary | ICD-10-CM

## 2020-12-20 HISTORY — PX: TOTAL KNEE ARTHROPLASTY: SHX125

## 2020-12-20 LAB — GLUCOSE, CAPILLARY
Glucose-Capillary: 146 mg/dL — ABNORMAL HIGH (ref 70–99)
Glucose-Capillary: 161 mg/dL — ABNORMAL HIGH (ref 70–99)
Glucose-Capillary: 171 mg/dL — ABNORMAL HIGH (ref 70–99)
Glucose-Capillary: 159 mg/dL — ABNORMAL HIGH (ref 70–99)
Glucose-Capillary: 156 mg/dL — ABNORMAL HIGH (ref 70–99)

## 2020-12-20 LAB — TYPE AND SCREEN
ABO/RH(D): O POS
Antibody Screen: NEGATIVE

## 2020-12-20 LAB — ABO/RH: ABO/RH(D): O POS

## 2020-12-20 SURGERY — ARTHROPLASTY, KNEE, TOTAL
Anesthesia: Spinal | Site: Knee | Laterality: Left

## 2020-12-20 MED ORDER — SODIUM CHLORIDE 0.9 % IV SOLN: INTRAVENOUS | Status: DC

## 2020-12-20 MED ORDER — PHENYLEPHRINE HCL (PRESSORS) 10 MG/ML IV SOLN
INTRAVENOUS | Status: AC
  Filled 2020-12-20: qty 2

## 2020-12-20 MED ORDER — DEXAMETHASONE SODIUM PHOSPHATE 10 MG/ML IJ SOLN
8.0000 mg | Freq: Once | INTRAMUSCULAR | Status: AC
  Administered 2020-12-20: 4 mg via INTRAVENOUS

## 2020-12-20 MED ORDER — LACTATED RINGERS IV SOLN: INTRAVENOUS | Status: DC

## 2020-12-20 MED ORDER — METHOCARBAMOL 500 MG IVPB - SIMPLE MED
INTRAVENOUS | Status: AC
  Filled 2020-12-20: qty 50

## 2020-12-20 MED ORDER — HYDROMORPHONE HCL 1 MG/ML IJ SOLN
0.5000 mg | INTRAMUSCULAR | Status: DC | PRN
  Administered 2020-12-20 – 2020-12-21 (×4): 1 mg via INTRAVENOUS
  Filled 2020-12-20 (×4): qty 1

## 2020-12-20 MED ORDER — PANTOPRAZOLE SODIUM 40 MG PO TBEC
40.0000 mg | DELAYED_RELEASE_TABLET | Freq: Every day | ORAL | Status: DC
Start: 2020-12-20 — End: 2020-12-21
  Administered 2020-12-20 – 2020-12-21 (×2): 40 mg via ORAL
  Filled 2020-12-20 (×2): qty 1

## 2020-12-20 MED ORDER — BUPIVACAINE-EPINEPHRINE (PF) 0.25% -1:200000 IJ SOLN
INTRAMUSCULAR | Status: AC
  Filled 2020-12-20: qty 30

## 2020-12-20 MED ORDER — TRANEXAMIC ACID-NACL 1000-0.7 MG/100ML-% IV SOLN: 1000.0000 mg | Freq: Once | INTRAVENOUS | Status: DC

## 2020-12-20 MED ORDER — MIDAZOLAM HCL 2 MG/2ML IJ SOLN
INTRAMUSCULAR | Status: AC
  Filled 2020-12-20: qty 2

## 2020-12-20 MED ORDER — PROPOFOL 500 MG/50ML IV EMUL
INTRAVENOUS | Status: DC | PRN
  Administered 2020-12-20: 100 ug/kg/min via INTRAVENOUS

## 2020-12-20 MED ORDER — PHENOL 1.4 % MT LIQD: 1.0000 | OROMUCOSAL | Status: DC | PRN

## 2020-12-20 MED ORDER — METHOCARBAMOL 500 MG IVPB - SIMPLE MED
500.0000 mg | Freq: Four times a day (QID) | INTRAVENOUS | Status: DC | PRN
  Administered 2020-12-20: 500 mg via INTRAVENOUS
  Filled 2020-12-20: qty 50

## 2020-12-20 MED ORDER — CHLORHEXIDINE GLUCONATE 0.12 % MT SOLN: 15.0000 mL | Freq: Once | OROMUCOSAL | Status: AC

## 2020-12-20 MED ORDER — SUCCINYLCHOLINE CHLORIDE 200 MG/10ML IV SOSY
PREFILLED_SYRINGE | INTRAVENOUS | Status: AC
  Filled 2020-12-20: qty 10

## 2020-12-20 MED ORDER — OXYCODONE HCL 5 MG PO TABS: 5.0000 mg | ORAL_TABLET | Freq: Once | ORAL | Status: DC | PRN

## 2020-12-20 MED ORDER — 0.9 % SODIUM CHLORIDE (POUR BTL) OPTIME
TOPICAL | Status: DC | PRN
  Administered 2020-12-20: 1000 mL

## 2020-12-20 MED ORDER — ONDANSETRON HCL 4 MG/2ML IJ SOLN
INTRAMUSCULAR | Status: DC | PRN
  Administered 2020-12-20: 4 mg via INTRAVENOUS

## 2020-12-20 MED ORDER — SODIUM CHLORIDE 0.9 % IR SOLN
Status: DC | PRN
  Administered 2020-12-20: 1000 mL

## 2020-12-20 MED ORDER — METOPROLOL TARTRATE 50 MG PO TABS
75.0000 mg | ORAL_TABLET | Freq: Two times a day (BID) | ORAL | Status: DC
  Administered 2020-12-21: 75 mg via ORAL
  Filled 2020-12-20 (×2): qty 1

## 2020-12-20 MED ORDER — OXYCODONE HCL 5 MG/5ML PO SOLN: 5.0000 mg | Freq: Once | ORAL | Status: DC | PRN

## 2020-12-20 MED ORDER — DOCUSATE SODIUM 100 MG PO CAPS
100.0000 mg | ORAL_CAPSULE | Freq: Two times a day (BID) | ORAL | Status: DC
  Administered 2020-12-20 – 2020-12-21 (×2): 100 mg via ORAL
  Filled 2020-12-20 (×2): qty 1

## 2020-12-20 MED ORDER — CEFAZOLIN SODIUM-DEXTROSE 2-4 GM/100ML-% IV SOLN
2.0000 g | INTRAVENOUS | Status: AC
  Administered 2020-12-20: 2 g via INTRAVENOUS
  Filled 2020-12-20: qty 100

## 2020-12-20 MED ORDER — FENTANYL CITRATE (PF) 100 MCG/2ML IJ SOLN
INTRAMUSCULAR | Status: DC | PRN
  Administered 2020-12-20: 50 ug via INTRAVENOUS

## 2020-12-20 MED ORDER — ONDANSETRON HCL 4 MG/2ML IJ SOLN
4.0000 mg | Freq: Once | INTRAMUSCULAR | Status: AC | PRN
  Administered 2020-12-20: 4 mg via INTRAVENOUS

## 2020-12-20 MED ORDER — SCOPOLAMINE 1 MG/3DAYS TD PT72
MEDICATED_PATCH | TRANSDERMAL | Status: DC | PRN
  Administered 2020-12-20: 1 via TRANSDERMAL

## 2020-12-20 MED ORDER — INSULIN ASPART 100 UNIT/ML IJ SOLN
0.0000 [IU] | Freq: Three times a day (TID) | INTRAMUSCULAR | Status: DC
  Administered 2020-12-20 (×2): 3 [IU] via SUBCUTANEOUS
  Administered 2020-12-21: 5 [IU] via SUBCUTANEOUS

## 2020-12-20 MED ORDER — SODIUM CHLORIDE (PF) 0.9 % IJ SOLN
INTRAMUSCULAR | Status: DC | PRN
  Administered 2020-12-20: 30 mL

## 2020-12-20 MED ORDER — ONDANSETRON HCL 4 MG/2ML IJ SOLN
INTRAMUSCULAR | Status: AC
  Filled 2020-12-20: qty 2

## 2020-12-20 MED ORDER — ORAL CARE MOUTH RINSE: 15.0000 mL | Freq: Once | OROMUCOSAL | Status: AC

## 2020-12-20 MED ORDER — VORTIOXETINE HBR 5 MG PO TABS
20.0000 mg | ORAL_TABLET | Freq: Every day | ORAL | Status: DC
  Administered 2020-12-21: 20 mg via ORAL
  Filled 2020-12-20 (×2): qty 4

## 2020-12-20 MED ORDER — BISACODYL 10 MG RE SUPP: 10.0000 mg | Freq: Every day | RECTAL | Status: DC | PRN

## 2020-12-20 MED ORDER — TRANEXAMIC ACID-NACL 1000-0.7 MG/100ML-% IV SOLN
1000.0000 mg | INTRAVENOUS | Status: AC
  Administered 2020-12-20: 1000 mg via INTRAVENOUS
  Filled 2020-12-20: qty 100

## 2020-12-20 MED ORDER — METHOCARBAMOL 500 MG PO TABS
500.0000 mg | ORAL_TABLET | Freq: Four times a day (QID) | ORAL | Status: DC | PRN
  Filled 2020-12-20: qty 1

## 2020-12-20 MED ORDER — KETOROLAC TROMETHAMINE 30 MG/ML IJ SOLN
INTRAMUSCULAR | Status: AC
  Filled 2020-12-20: qty 1

## 2020-12-20 MED ORDER — DEXAMETHASONE SODIUM PHOSPHATE 10 MG/ML IJ SOLN
10.0000 mg | Freq: Once | INTRAMUSCULAR | Status: AC
  Administered 2020-12-21: 10 mg via INTRAVENOUS
  Filled 2020-12-20: qty 1

## 2020-12-20 MED ORDER — CEFAZOLIN SODIUM-DEXTROSE 2-4 GM/100ML-% IV SOLN
2.0000 g | Freq: Four times a day (QID) | INTRAVENOUS | Status: AC
  Administered 2020-12-20 (×2): 2 g via INTRAVENOUS
  Filled 2020-12-20 (×2): qty 100

## 2020-12-20 MED ORDER — BUPIVACAINE-EPINEPHRINE (PF) 0.25% -1:200000 IJ SOLN
INTRAMUSCULAR | Status: DC | PRN
  Administered 2020-12-20: 30 mL

## 2020-12-20 MED ORDER — MELATONIN 5 MG PO TABS
10.0000 mg | ORAL_TABLET | Freq: Every evening | ORAL | Status: DC | PRN
  Filled 2020-12-20: qty 2

## 2020-12-20 MED ORDER — BUPROPION HCL ER (XL) 300 MG PO TB24
300.0000 mg | ORAL_TABLET | Freq: Every day | ORAL | Status: DC
  Administered 2020-12-21: 300 mg via ORAL
  Filled 2020-12-20: qty 1

## 2020-12-20 MED ORDER — PROPOFOL 10 MG/ML IV BOLUS
INTRAVENOUS | Status: DC | PRN
  Administered 2020-12-20 (×2): 20 mg via INTRAVENOUS

## 2020-12-20 MED ORDER — METHYLPHENIDATE HCL ER (OSM) 27 MG PO TBCR
27.0000 mg | EXTENDED_RELEASE_TABLET | Freq: Every day | ORAL | Status: DC
  Filled 2020-12-20: qty 1

## 2020-12-20 MED ORDER — DEXAMETHASONE SODIUM PHOSPHATE 10 MG/ML IJ SOLN
INTRAMUSCULAR | Status: AC
  Filled 2020-12-20: qty 1

## 2020-12-20 MED ORDER — MENTHOL 3 MG MT LOZG: 1.0000 | LOZENGE | OROMUCOSAL | Status: DC | PRN

## 2020-12-20 MED ORDER — ACETAMINOPHEN 325 MG PO TABS
325.0000 mg | ORAL_TABLET | Freq: Four times a day (QID) | ORAL | Status: DC | PRN
  Administered 2020-12-21: 650 mg via ORAL
  Filled 2020-12-20: qty 2

## 2020-12-20 MED ORDER — FENTANYL CITRATE PF 50 MCG/ML IJ SOSY
PREFILLED_SYRINGE | INTRAMUSCULAR | Status: AC
  Filled 2020-12-20: qty 1

## 2020-12-20 MED ORDER — SCOPOLAMINE 1 MG/3DAYS TD PT72
MEDICATED_PATCH | TRANSDERMAL | Status: AC
  Filled 2020-12-20: qty 1

## 2020-12-20 MED ORDER — METFORMIN HCL 500 MG PO TABS
500.0000 mg | ORAL_TABLET | Freq: Two times a day (BID) | ORAL | Status: DC
  Administered 2020-12-21: 500 mg via ORAL
  Filled 2020-12-20: qty 1

## 2020-12-20 MED ORDER — DIPHENHYDRAMINE HCL 12.5 MG/5ML PO ELIX
12.5000 mg | ORAL_SOLUTION | ORAL | Status: DC | PRN
  Administered 2020-12-20: 25 mg via ORAL
  Administered 2020-12-20 (×2): 12.5 mg via ORAL
  Administered 2020-12-20 – 2020-12-21 (×4): 25 mg via ORAL
  Filled 2020-12-20: qty 10
  Filled 2020-12-20: qty 5
  Filled 2020-12-20 (×4): qty 10
  Filled 2020-12-20: qty 5

## 2020-12-20 MED ORDER — ASPIRIN 81 MG PO CHEW
81.0000 mg | CHEWABLE_TABLET | Freq: Two times a day (BID) | ORAL | Status: DC
  Administered 2020-12-20 – 2020-12-21 (×2): 81 mg via ORAL
  Filled 2020-12-20 (×2): qty 1

## 2020-12-20 MED ORDER — SODIUM CHLORIDE (PF) 0.9 % IJ SOLN
INTRAMUSCULAR | Status: AC
  Filled 2020-12-20: qty 30

## 2020-12-20 MED ORDER — CHLORHEXIDINE GLUCONATE CLOTH 2 % EX PADS
6.0000 | MEDICATED_PAD | Freq: Every day | CUTANEOUS | Status: DC
  Administered 2020-12-21: 6 via TOPICAL

## 2020-12-20 MED ORDER — KETOROLAC TROMETHAMINE 30 MG/ML IJ SOLN
INTRAMUSCULAR | Status: DC | PRN
  Administered 2020-12-20: 30 mg

## 2020-12-20 MED ORDER — STERILE WATER FOR IRRIGATION IR SOLN
Status: DC | PRN
  Administered 2020-12-20: 2000 mL

## 2020-12-20 MED ORDER — MIDAZOLAM HCL 5 MG/5ML IJ SOLN
INTRAMUSCULAR | Status: DC | PRN
  Administered 2020-12-20 (×2): 1 mg via INTRAVENOUS

## 2020-12-20 MED ORDER — POVIDONE-IODINE 10 % EX SWAB: 2.0000 "application " | Freq: Once | CUTANEOUS | Status: AC

## 2020-12-20 MED ORDER — NORTRIPTYLINE HCL 25 MG PO CAPS
50.0000 mg | ORAL_CAPSULE | Freq: Every day | ORAL | Status: DC
  Administered 2020-12-20: 50 mg via ORAL
  Filled 2020-12-20: qty 2

## 2020-12-20 MED ORDER — ONDANSETRON HCL 4 MG/2ML IJ SOLN
4.0000 mg | Freq: Four times a day (QID) | INTRAMUSCULAR | Status: DC | PRN
  Administered 2020-12-20 – 2020-12-21 (×2): 4 mg via INTRAVENOUS
  Filled 2020-12-20 (×2): qty 2

## 2020-12-20 MED ORDER — FERROUS SULFATE 325 (65 FE) MG PO TABS
325.0000 mg | ORAL_TABLET | Freq: Three times a day (TID) | ORAL | Status: DC
  Filled 2020-12-20: qty 1

## 2020-12-20 MED ORDER — FENTANYL CITRATE PF 50 MCG/ML IJ SOSY
25.0000 ug | PREFILLED_SYRINGE | INTRAMUSCULAR | Status: DC | PRN
  Administered 2020-12-20: 25 ug via INTRAVENOUS

## 2020-12-20 MED ORDER — ONDANSETRON HCL 4 MG PO TABS
4.0000 mg | ORAL_TABLET | Freq: Four times a day (QID) | ORAL | Status: DC | PRN
  Administered 2020-12-21: 4 mg via ORAL
  Filled 2020-12-20: qty 1

## 2020-12-20 MED ORDER — FENTANYL CITRATE (PF) 100 MCG/2ML IJ SOLN
INTRAMUSCULAR | Status: AC
  Filled 2020-12-20: qty 2

## 2020-12-20 MED ORDER — OXYCODONE HCL 5 MG PO TABS
5.0000 mg | ORAL_TABLET | ORAL | Status: DC | PRN
  Administered 2020-12-20: 10 mg via ORAL
  Filled 2020-12-20: qty 2

## 2020-12-20 MED ORDER — TRANEXAMIC ACID-NACL 1000-0.7 MG/100ML-% IV SOLN
1000.0000 mg | Freq: Once | INTRAVENOUS | Status: AC
  Administered 2020-12-20: 1000 mg via INTRAVENOUS
  Filled 2020-12-20: qty 100

## 2020-12-20 MED ORDER — POLYETHYLENE GLYCOL 3350 17 G PO PACK: 17.0000 g | PACK | Freq: Every day | ORAL | Status: DC | PRN

## 2020-12-20 MED ORDER — OXYCODONE HCL 5 MG PO TABS
10.0000 mg | ORAL_TABLET | ORAL | Status: DC | PRN
  Administered 2020-12-20 – 2020-12-21 (×3): 15 mg via ORAL
  Filled 2020-12-20 (×3): qty 3

## 2020-12-20 MED ORDER — BUSPIRONE HCL 5 MG PO TABS
15.0000 mg | ORAL_TABLET | Freq: Two times a day (BID) | ORAL | Status: DC
  Administered 2020-12-20 – 2020-12-21 (×2): 15 mg via ORAL
  Filled 2020-12-20 (×2): qty 1

## 2020-12-20 MED ORDER — BUPIVACAINE-EPINEPHRINE (PF) 0.5% -1:200000 IJ SOLN
INTRAMUSCULAR | Status: DC | PRN
  Administered 2020-12-20: 30 mL via PERINEURAL

## 2020-12-20 MED ORDER — BUPIVACAINE IN DEXTROSE 0.75-8.25 % IT SOLN
INTRATHECAL | Status: DC | PRN
  Administered 2020-12-20: 1.7 mL via INTRATHECAL

## 2020-12-20 MED ORDER — FENTANYL CITRATE PF 50 MCG/ML IJ SOSY
PREFILLED_SYRINGE | INTRAMUSCULAR | Status: AC
  Administered 2020-12-20: 25 ug via INTRAVENOUS
  Filled 2020-12-20: qty 1

## 2020-12-20 MED ORDER — KETOROLAC TROMETHAMINE 15 MG/ML IJ SOLN
7.5000 mg | Freq: Four times a day (QID) | INTRAMUSCULAR | Status: AC
  Administered 2020-12-20 (×2): 7.5 mg via INTRAVENOUS
  Filled 2020-12-20 (×2): qty 1

## 2020-12-20 SURGICAL SUPPLY — 54 items
ADH SKN CLS APL DERMABOND .7 (GAUZE/BANDAGES/DRESSINGS) ×1
ATTUNE MED ANAT PAT 35 KNEE (Knees) ×1 IMPLANT
ATTUNE PSFEM LTSZ5 NARCEM KNEE (Femur) ×1 IMPLANT
ATTUNE PSRP INSR SZ5 5 KNEE (Insert) ×1 IMPLANT
BAG COUNTER SPONGE SURGICOUNT (BAG) IMPLANT
BAG SPEC THK2 15X12 ZIP CLS (MISCELLANEOUS)
BAG SPNG CNTER NS LX DISP (BAG)
BAG ZIPLOCK 12X15 (MISCELLANEOUS) IMPLANT
BASEPLATE TIBIAL ROTATING SZ 4 (Knees) ×1 IMPLANT
BLADE SAW SGTL 11.0X1.19X90.0M (BLADE) IMPLANT
BLADE SAW SGTL 13.0X1.19X90.0M (BLADE) ×2 IMPLANT
BLADE SURG SZ10 CARB STEEL (BLADE) ×4 IMPLANT
BNDG ELASTIC 6X5.8 VLCR STR LF (GAUZE/BANDAGES/DRESSINGS) ×2 IMPLANT
BOWL SMART MIX CTS (DISPOSABLE) ×2 IMPLANT
BSPLAT TIB 4 CMNT ROT PLAT STR (Knees) ×1 IMPLANT
CEMENT HV SMART SET (Cement) ×2 IMPLANT
CUFF TOURN SGL QUICK 34 (TOURNIQUET CUFF) ×2
CUFF TRNQT CYL 34X4.125X (TOURNIQUET CUFF) ×1 IMPLANT
DECANTER SPIKE VIAL GLASS SM (MISCELLANEOUS) ×4 IMPLANT
DERMABOND ADVANCED (GAUZE/BANDAGES/DRESSINGS) ×1
DERMABOND ADVANCED .7 DNX12 (GAUZE/BANDAGES/DRESSINGS) ×1 IMPLANT
DRAPE INCISE IOBAN 66X45 STRL (DRAPES) ×2 IMPLANT
DRAPE U-SHAPE 47X51 STRL (DRAPES) ×2 IMPLANT
DRESSING AQUACEL AG SP 3.5X10 (GAUZE/BANDAGES/DRESSINGS) ×1 IMPLANT
DRSG AQUACEL AG ADV 3.5X10 (GAUZE/BANDAGES/DRESSINGS) ×1 IMPLANT
DRSG AQUACEL AG SP 3.5X10 (GAUZE/BANDAGES/DRESSINGS) ×2
DURAPREP 26ML APPLICATOR (WOUND CARE) ×4 IMPLANT
ELECT REM PT RETURN 15FT ADLT (MISCELLANEOUS) ×2 IMPLANT
GLOVE SURG ENC MOIS LTX SZ6 (GLOVE) ×2 IMPLANT
GLOVE SURG ENC MOIS LTX SZ7 (GLOVE) ×2 IMPLANT
GLOVE SURG POLY ORTHO LF SZ6.5 (GLOVE) ×2 IMPLANT
GLOVE SURG UNDER POLY LF SZ7.5 (GLOVE) ×2 IMPLANT
GOWN STRL REUS W/TWL LRG LVL3 (GOWN DISPOSABLE) ×2 IMPLANT
HANDPIECE INTERPULSE COAX TIP (DISPOSABLE) ×2
HOLDER FOLEY CATH W/STRAP (MISCELLANEOUS) IMPLANT
KIT TURNOVER KIT A (KITS) ×2 IMPLANT
MANIFOLD NEPTUNE II (INSTRUMENTS) ×2 IMPLANT
NDL SAFETY ECLIPSE 18X1.5 (NEEDLE) IMPLANT
NEEDLE HYPO 18GX1.5 SHARP (NEEDLE)
NS IRRIG 1000ML POUR BTL (IV SOLUTION) ×2 IMPLANT
PACK TOTAL KNEE CUSTOM (KITS) ×2 IMPLANT
PROTECTOR NERVE ULNAR (MISCELLANEOUS) ×2 IMPLANT
SET HNDPC FAN SPRY TIP SCT (DISPOSABLE) ×1 IMPLANT
SET PAD KNEE POSITIONER (MISCELLANEOUS) ×2 IMPLANT
SUT MNCRL AB 4-0 PS2 18 (SUTURE) ×2 IMPLANT
SUT STRATAFIX PDS+ 0 24IN (SUTURE) ×2 IMPLANT
SUT VIC AB 1 CT1 36 (SUTURE) ×2 IMPLANT
SUT VIC AB 2-0 CT1 27 (SUTURE) ×6
SUT VIC AB 2-0 CT1 TAPERPNT 27 (SUTURE) ×3 IMPLANT
SYR 3ML LL SCALE MARK (SYRINGE) ×2 IMPLANT
TRAY FOLEY MTR SLVR 16FR STAT (SET/KITS/TRAYS/PACK) ×2 IMPLANT
TUBE SUCTION HIGH CAP CLEAR NV (SUCTIONS) ×2 IMPLANT
WATER STERILE IRR 1000ML POUR (IV SOLUTION) ×4 IMPLANT
WRAP KNEE MAXI GEL POST OP (GAUZE/BANDAGES/DRESSINGS) ×2 IMPLANT

## 2020-12-20 NOTE — Anesthesia Procedure Notes (Signed)
Spinal  Patient location during procedure: OR Start time: 12/20/2020 7:19 AM End time: 12/20/2020 7:22 AM Reason for block: procedure for pain Staffing Performed: anesthesiologist  Anesthesiologist: Josephine Igo, MD Preanesthetic Checklist Completed: patient identified, IV checked, site marked, risks and benefits discussed, surgical consent, monitors and equipment checked, pre-op evaluation and timeout performed Spinal Block Patient position: sitting Prep: DuraPrep and site prepped and draped Patient monitoring: heart rate, cardiac monitor, continuous pulse ox and blood pressure Approach: midline Location: L3-4 Injection technique: single-shot Needle Needle type: Pencan  Needle gauge: 24 G Needle length: 9 cm Needle insertion depth: 7 cm Assessment Sensory level: T6 Events: CSF return Additional Notes Patient tolerated procedure well. Adequate sensory level.

## 2020-12-20 NOTE — Anesthesia Postprocedure Evaluation (Signed)
Anesthesia Post Note  Patient: [name]  Procedure(s) Performed: TOTAL KNEE ARTHROPLASTY (Left: Knee)     Patient location during evaluation: PACU Anesthesia Type: Spinal Level of consciousness: oriented and awake and alert Pain management: pain level controlled Vital Signs Assessment: post-procedure vital signs reviewed and stable Respiratory status: spontaneous breathing, respiratory function stable and nonlabored ventilation Cardiovascular status: blood pressure returned to baseline and stable Postop Assessment: no headache, no backache, no apparent nausea or vomiting, spinal receding and patient able to bend at knees Anesthetic complications: no   No notable events documented.  Last Vitals:  Vitals:   12/20/20 0930 12/20/20 0945  BP: 96/80 99/70  Pulse: 72 66  Resp: 14 16  Temp:    SpO2: 100% 100%    Last Pain:  Vitals:   12/20/20 0930  TempSrc:   PainSc: 0-No pain                 Sevanna Ballengee A.

## 2020-12-20 NOTE — Anesthesia Procedure Notes (Signed)
Anesthesia Regional Block: Adductor canal block   Pre-Anesthetic Checklist: , timeout performed,  Correct Patient, Correct Site, Correct Laterality,  Correct Procedure, Correct Position, site marked,  Risks and benefits discussed,  Surgical consent,  Pre-op evaluation,  At surgeon's request and post-op pain management  Laterality: Left  Prep: chloraprep       Needles:  Injection technique: Single-shot  Needle Type: Echogenic Stimulator Needle     Needle Length: 10cm  Needle Gauge: 21   Needle insertion depth: 7 cm   Additional Needles:   Procedures:,,,, ultrasound used (permanent image in chart),,    Narrative:  Start time: 12/20/2020 7:10 AM End time: 12/20/2020 7:15 AM Injection made incrementally with aspirations every 5 mL.  Performed by: Personally  Anesthesiologist: Josephine Igo, MD  Additional Notes: Timeout performed. Patient sedated. Relevant anatomy ID'd using [place]. Incremental 2-10ml injection of LA with frequent aspiration. Patient tolerated procedure well.    Left Adductor Canal Block

## 2020-12-20 NOTE — Discharge Instructions (Signed)

## 2020-12-20 NOTE — Op Note (Signed)
NAME:  [name]                      MEDICAL RECORD NO.:  [id number]                             FACILITY:  [facility]      PHYSICIAN:  Pietro Cassis. Alvan Dame, M.D.  DATE OF BIRTH:  July 10, 1965      DATE OF PROCEDURE:  12/20/2020                                     OPERATIVE REPORT         PREOPERATIVE DIAGNOSIS:  Left knee osteoarthritis.      POSTOPERATIVE DIAGNOSIS:  Left knee osteoarthritis.      FINDINGS:  The patient was noted to have complete loss of cartilage and   bone-on-bone arthritis with associated osteophytes in the medial and patellofemoral compartments of   the knee with a significant synovitis and associated effusion.  The patient had failed months of conservative treatment including medications, injection therapy, activity modification.     PROCEDURE:  Left total knee replacement.      COMPONENTS USED:  DePuy Attune rotating platform posterior stabilized knee   system, a size 5N femur, 4 tibia, size 5 mm PS AOX insert, and 35 anatomic patellar   button.      SURGEON:  Pietro Cassis. Alvan Dame, M.D.      ASSISTANT:  Costella Hatcher, PA-C.      ANESTHESIA:  Regional and Spinal.      SPECIMENS:  None.      COMPLICATION:  None.      DRAINS:  None.  EBL: <100 cc      TOURNIQUET TIME:  35 min @ 225 mmHg     The patient was stable to the recovery room.      INDICATION FOR PROCEDURE:  [name] is a 55 y.o. female patient of   mine.  The patient had been seen, evaluated, and treated for months conservatively in the   office with medication, activity modification, and injections.  The patient had   radiographic changes of bone-on-bone arthritis with endplate sclerosis and osteophytes noted.  Based on the radiographic changes and failed conservative measures, the patient   decided to proceed with definitive treatment, total knee replacement.  Risks of infection, DVT, component failure, need for revision surgery, neurovascular injury were reviewed in the office  setting.  The postop course was reviewed stressing the efforts to maximize post-operative satisfaction and function.  Consent was obtained for benefit of pain   relief.      PROCEDURE IN DETAIL:  The patient was brought to the operative theater.   Once adequate anesthesia, preoperative antibiotics, 2 gm of Ancef,1 gm of Tranexamic Acid, and 10 mg of Decadron administered, the patient was positioned supine with a left thigh tourniquet placed.  The  left lower extremity was prepped and draped in sterile fashion.  A time-   out was performed identifying the patient, planned procedure, and the appropriate extremity.      The left lower extremity was placed in the [facility] leg holder.  The leg was   exsanguinated, tourniquet elevated to 250 mmHg.  A midline incision was   made followed by median parapatellar arthrotomy.  Following initial   exposure, attention was  first directed to the patella.  Precut   measurement was noted to be 22 mm.  I resected down to 14 mm and used a   35 anatomic patellar button to restore patellar height as well as cover the cut surface.      The lug holes were drilled and a metal shim was placed to protect the   patella from retractors and saw blade during the procedure.      At this point, attention was now directed to the femur.  The femoral   canal was opened with a drill, irrigated to try to prevent fat emboli.  An   intramedullary rod was passed at 3 degrees valgus, 9 mm of bone was   resected off the distal femur.  Following this resection, the tibia was   subluxated anteriorly.  Using the extramedullary guide, 2 mm of bone was resected off   the proximal medial tibia.  We confirmed the gap would be   stable medially and laterally with a size 5 spacer block as well as confirmed that the tibial cut was perpendicular in the coronal plane, checking with an alignment rod.      Once this was done, I sized the femur to be a size 5 in the anterior-   posterior  dimension, chose a narrow component based on medial and   lateral dimension.  The size 5 rotation block was then pinned in   position anterior referenced using the C-clamp to set rotation.  The   anterior, posterior, and  chamfer cuts were made without difficulty nor   notching making certain that I was along the anterior cortex to help   with flexion gap stability.      The final box cut was made off the lateral aspect of distal femur.      At this point, the tibia was sized to be a size 4.  The size 4 tray was   then pinned in position through the medial third of the tubercle,   drilled, and keel punched.  Trial reduction was now carried with a 5 femur,  4 tibia, a size 5 mm PS insert, and the 35 anatomic patella botton.  The knee was brought to full extension with good flexion stability with the patella   tracking through the trochlea without application of pressure.  Given   all these findings the trial components removed.  Final components were   opened and cement was mixed.  The knee was irrigated with normal saline solution and pulse lavage.  The synovial lining was   then injected with 30 cc of 0.25% Marcaine with epinephrine, 1 cc of Toradol and 30 cc of NS for a total of 61 cc.     Final implants were then cemented onto cleaned and dried cut surfaces of bone with the knee brought to extension with a size 5 mm PS trial insert.      Once the cement had fully cured, excess cement was removed   throughout the knee.  I confirmed that I was satisfied with the range of   motion and stability, and the final size 5 mm PS AOX insert was chosen.  It was   placed into the knee.      The tourniquet had been let down at 35 minutes.  No significant   hemostasis was required.  The extensor mechanism was then reapproximated using #1 Vicryl and #1 Stratafix sutures with the knee   in flexion.  The  remaining wound was closed with 2-0 Vicryl and running 4-0 Monocryl.   The knee was cleaned,  dried, dressed sterilely using Dermabond and   Aquacel dressing.  The patient was then   brought to recovery room in stable condition, tolerating the procedure   well.   Please note that Physician Assistant, Costella Hatcher, PA-C was present for the entirety of the case, and was utilized for pre-operative positioning, peri-operative retractor management, general facilitation of the procedure and for primary wound closure at the end of the case.              Pietro Cassis Alvan Dame, M.D.    12/20/2020 7:32 AM

## 2020-12-20 NOTE — Anesthesia Procedure Notes (Addendum)
Procedure Name: MAC Date/Time: 12/20/2020 7:20 AM Performed by: West Pugh, CRNA Pre-anesthesia Checklist: Patient identified, Emergency Drugs available, Suction available, Patient being monitored and Timeout performed Patient Re-evaluated:Patient Re-evaluated prior to induction Oxygen Delivery Method: Simple face mask Preoxygenation: Pre-oxygenation with 100% oxygen Induction Type: IV induction Placement Confirmation: positive ETCO2 Dental Injury: Teeth and Oropharynx as per pre-operative assessment

## 2020-12-20 NOTE — Transfer of Care (Signed)
Immediate Anesthesia Transfer of Care Note  Patient: MALYIAH FELLOWS  Procedure(s) Performed: TOTAL KNEE ARTHROPLASTY (Left: Knee)  Patient Location: PACU  Anesthesia Type:Spinal and MAC combined with regional for post-op pain  Level of Consciousness: awake, alert  and patient cooperative  Airway & Oxygen Therapy: Patient Spontanous Breathing and Patient connected to face mask oxygen  Post-op Assessment: Report given to RN and Post -op Vital signs reviewed and stable  Post vital signs: Reviewed and stable  Last Vitals:  Vitals Value Taken Time  BP 112/73 12/20/20 0909  Temp    Pulse 75 12/20/20 0910  Resp 18 12/20/20 0911  SpO2 100 % 12/20/20 0910  Vitals shown include unvalidated device data.  Last Pain:  Vitals:   12/20/20 0610  TempSrc:   PainSc: 3       Patients Stated Pain Goal: 3 (24/81/85 9093)  Complications: No notable events documented.

## 2020-12-20 NOTE — Evaluation (Signed)
Physical Therapy Evaluation Patient Details Name: [name] MRN: [id number] DOB: June 04, 1965 Today's Date: 12/20/2020  History of Present Illness  Patient is 55 y.o. female s/p Lt TKA on 12/20/20 with PMH significant for ADHD, back pain, DM, GERD, depression, psoriasis.    Clinical Impression  [name] is a 55 y.o. female POD 0 s/p Lt TKA. Patient reports independence with mobility at baseline. Patient is now limited by functional impairments (see PT problem list below) and requires min assist for transfers and gait with RW. Patient was able to ambulate ~70 feet with RW and min guard/assist. Patient instructed in exercise to facilitate ROM and circulation to manage edema to reduce risk of DVT. Patient will benefit from continued skilled PT interventions to address impairments and progress towards PLOF. Acute PT will follow to progress mobility and stair training in preparation for safe discharge home.        Recommendations for follow up therapy are one component of a multi-disciplinary discharge planning process, led by the attending physician.  Recommendations may be updated based on patient status, additional functional criteria and insurance authorization.  Follow Up Recommendations Follow surgeon's recommendation for DC plan and follow-up therapies;Outpatient PT    Equipment Recommendations  Rolling walker with 5" wheels    Recommendations for Other Services       Precautions / Restrictions Precautions Precautions: Fall Restrictions Weight Bearing Restrictions: No Other Position/Activity Restrictions: WBAT      Mobility  Bed Mobility Overal bed mobility: Needs Assistance Bed Mobility: Supine to Sit     Supine to sit: Min guard;HOB elevated     General bed mobility comments: cues to use bed rail, pt able to bring LE's off EOB    Transfers Overall transfer level: Needs assistance Equipment used: Rolling walker (2 wheeled) Transfers: Sit to/from  Stand Sit to Stand: Min assist         General transfer comment: cues for hand placement for safe power up technique. light assist to complete rise and steady in standing.  Ambulation/Gait Ambulation/Gait assistance: Min assist;Min guard Gait Distance (Feet): 70 Feet Assistive device: Rolling walker (2 wheeled) Gait Pattern/deviations: Step-to pattern;Decreased stride length;Decreased weight shift to left Gait velocity: decr   General Gait Details: cues for safe step to pattern and proximity to RW, no overt LOB or buckling at Lt knee. pt progressed to guarding for safety and maintained safe position to walker.  Stairs            Wheelchair Mobility    Modified Rankin (Stroke Patients Only)       Balance Overall balance assessment: Needs assistance Sitting-balance support: Feet supported Sitting balance-Leahy Scale: Good     Standing balance support: During functional activity;Bilateral upper extremity supported Standing balance-Leahy Scale: Fair                               Pertinent Vitals/Pain Pain Assessment: 0-10 Pain Score: 4  Pain Location: Lt knee Pain Descriptors / Indicators: Aching;Discomfort Pain Intervention(s): Limited activity within patient's tolerance;Monitored during session;Repositioned;Premedicated before session;Ice applied    Home Living Family/patient expects to be discharged to:: Private residence Living Arrangements: Spouse/significant other;Parent Available Help at Discharge: Family Type of Home: House Home Access: Stairs to enter Entrance Stairs-Rails: Psychiatric nurse of Steps: 5 Home Layout: One level Home Equipment: Crutches      Prior Function Level of Independence: Independent with assistive device(s)  Comments: uses crutches intermittently due to pain/limp     Hand Dominance   Dominant Hand: Right    Extremity/Trunk Assessment   Upper Extremity Assessment Upper Extremity  Assessment: Overall WFL for tasks assessed    Lower Extremity Assessment Lower Extremity Assessment: LLE deficits/detail LLE Deficits / Details: good quad acitvation, no extensor lag with SLR LLE Sensation: WNL LLE Coordination: WNL    Cervical / Trunk Assessment Cervical / Trunk Assessment: Normal  Communication   Communication: No difficulties  Cognition Arousal/Alertness: Awake/alert Behavior During Therapy: WFL for tasks assessed/performed Overall Cognitive Status: Within Functional Limits for tasks assessed                                        General Comments      Exercises Total Joint Exercises Ankle Circles/Pumps: AROM;Both;20 reps;Seated Heel Slides: AROM;Left;5 reps;Seated   Assessment/Plan    PT Assessment Patient needs continued PT services  PT Problem List Decreased strength;Decreased range of motion;Decreased activity tolerance;Decreased mobility;Decreased balance;Decreased knowledge of use of DME;Decreased knowledge of precautions;Pain       PT Treatment Interventions DME instruction;Gait training;Stair training;Functional mobility training;Therapeutic activities;Therapeutic exercise;Balance training;Patient/family education    PT Goals (Current goals can be found in the Care Plan section)  Acute Rehab PT Goals 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 PT Goal Formulation: With patient Time For Goal Achievement: 12/27/20 Potential to Achieve Goals: Good    Frequency 7X/week   Barriers to discharge        Co-evaluation               AM-PAC PT "6 Clicks" Mobility  Outcome Measure Help needed turning from your back to your side while in a flat bed without using bedrails?: A Little Help needed moving from lying on your back to sitting on the side of a flat bed without using bedrails?: A Little Help needed moving to and from a bed to a chair (including a wheelchair)?: A Little Help needed  standing up from a chair using your arms (e.g., wheelchair or bedside chair)?: A Little Help needed to walk in hospital room?: A Little Help needed climbing 3-5 steps with a railing? : A Lot 6 Click Score: 17    End of Session Equipment Utilized During Treatment: Gait belt Activity Tolerance: Patient tolerated treatment well Patient left: in chair;with call bell/phone within reach;with chair alarm set;with family/visitor present Nurse Communication: Mobility status PT Visit Diagnosis: Muscle weakness (generalized) (M62.81);Difficulty in walking, not elsewhere classified (R26.2)    Time: 5374-8270 PT Time Calculation (min) (ACUTE ONLY): 22 min   Charges:   PT Evaluation $PT Eval Low Complexity: 1 Low          Verner Mould, DPT Acute Rehabilitation Services Office [phone] Pager [phone]   Jacques Navy 12/20/2020, 1:14 PM

## 2020-12-20 NOTE — Interval H&P Note (Signed)
History and Physical Interval Note:  12/20/2020 6:47 AM  [name]  has presented today for surgery, with the diagnosis of Left knee osteoarthritis.  The various methods of treatment have been discussed with the patient and family. After consideration of risks, benefits and other options for treatment, the patient has consented to  Procedure(s): TOTAL KNEE ARTHROPLASTY (Left) as a surgical intervention.  The patient's history has been reviewed, patient examined, no change in status, stable for surgery.  I have reviewed the patient's chart and labs.  Questions were answered to the patient's satisfaction.     Mauri Pole

## 2020-12-21 DIAGNOSIS — M1712 Unilateral primary osteoarthritis, left knee: Secondary | ICD-10-CM | POA: Diagnosis not present

## 2020-12-21 LAB — BASIC METABOLIC PANEL
Anion gap: 8 (ref 5–15)
BUN: 16 mg/dL (ref 6–20)
CO2: 26 mmol/L (ref 22–32)
Calcium: 9 mg/dL (ref 8.9–10.3)
Chloride: 104 mmol/L (ref 98–111)
Creatinine, Ser: 0.67 mg/dL (ref 0.44–1.00)
GFR, Estimated: >60 mL/min (ref >60)
Glucose, Bld: 140 mg/dL — ABNORMAL HIGH (ref 70–99)
Potassium: 4.2 mmol/L (ref 3.5–5.1)
Sodium: 138 mmol/L (ref 135–145)

## 2020-12-21 LAB — CBC
HCT: 30.8 % — ABNORMAL LOW (ref 36.0–46.0)
Hemoglobin: 10.8 g/dL — ABNORMAL LOW (ref 12.0–15.0)
MCH: 31 pg (ref 26.0–34.0)
MCHC: 35.1 g/dL (ref 30.0–36.0)
MCV: 88.5 fL (ref 80.0–100.0)
Platelets: 142 10*3/uL — ABNORMAL LOW (ref 150–400)
RBC: 3.48 MIL/uL — ABNORMAL LOW (ref 3.87–5.11)
RDW: 12.4 % (ref 11.5–15.5)
WBC: 9 10*3/uL (ref 4.0–10.5)
nRBC: 0 % (ref 0.0–0.2)

## 2020-12-21 LAB — GLUCOSE, CAPILLARY
Glucose-Capillary: 114 mg/dL — ABNORMAL HIGH (ref 70–99)
Glucose-Capillary: 244 mg/dL — ABNORMAL HIGH (ref 70–99)

## 2020-12-21 MED ORDER — ONDANSETRON HCL 4 MG PO TABS: 4.0000 mg | ORAL_TABLET | Freq: Four times a day (QID) | ORAL | 0 refills | Status: DC | PRN

## 2020-12-21 MED ORDER — METHOCARBAMOL 500 MG PO TABS: 500.0000 mg | ORAL_TABLET | Freq: Four times a day (QID) | ORAL | 0 refills | Status: DC | PRN

## 2020-12-21 MED ORDER — ASPIRIN 81 MG PO CHEW: 81.0000 mg | CHEWABLE_TABLET | Freq: Two times a day (BID) | ORAL | 0 refills | Status: AC

## 2020-12-21 MED ORDER — DOCUSATE SODIUM 100 MG PO CAPS: 100.0000 mg | ORAL_CAPSULE | Freq: Two times a day (BID) | ORAL | 0 refills | Status: DC

## 2020-12-21 MED ORDER — OXYCODONE HCL 5 MG PO TABS: 5.0000 mg | ORAL_TABLET | ORAL | 0 refills | Status: DC | PRN

## 2020-12-21 MED ORDER — POLYETHYLENE GLYCOL 3350 17 G PO PACK: 17.0000 g | PACK | Freq: Every day | ORAL | 0 refills | Status: DC | PRN

## 2020-12-21 NOTE — TOC Transition Note (Signed)
Transition of Care [facility]) - CM/SW Discharge Note   Patient Details  Name: DUANNA RUNK MRN: [id number] Date of Birth: 12/07/1965  Transition of Care [facility]) CM/SW Contact:  Lennart Pall, LCSW Phone Number: 12/21/2020, 1:33 PM   Clinical Narrative:    Met with pt and spouse today discuss DME and f/u plans.  Pt confirms she needs to have DME ordered via [place].  Orders for rw and tub seat faxed to [place] and alerted pt's [facility] social worker, [employer].  Pt aware that it could take 3-5 business days for the items to reach her home address and she confirms she has access to a rolling walker to borrow.  Plan for OPPT via [facility].  No further TOC needs.   Final next level of care: OP Rehab Barriers to Discharge: No Barriers Identified   Patient Goals and CMS Choice Patient states their goals for this hospitalization and ongoing recovery are:: return home      Discharge Placement                       Discharge Plan and Services                DME Arranged: Walker rolling, Shower stool DME Agency: [place] ([place]) Date DME Agency Contacted: 12/21/20 Time DME Agency Contacted: 64 Representative spoke with at DME Agency: Rosaland Lao, LCSW            Social Determinants of Health ([place]) Interventions     Readmission Risk Interventions No flowsheet data found.

## 2020-12-21 NOTE — Progress Notes (Signed)
Physical Therapy Treatment Patient Details Name: [name] MRN: [id number] DOB: 1965/08/29 Today's Date: 12/21/2020   History of Present Illness Patient is 55 y.o. female s/p Lt TKA on 12/20/20 with PMH significant for ADHD, back pain, DM, GERD, depression, psoriasis.    PT Comments    Patient with increased pain this date but progressing well with mobility overall. Pt ambulated ~100' with RW and min guard for safety. Cues needed for safe reach back and knee extension with sitting in recliner. Initiated HEP for ROM exercises but limited due to pain. Patient will benefit from additional session to progress gait/stair training and finalize HEP.     Recommendations for follow up therapy are one component of a multi-disciplinary discharge planning process, led by the attending physician.  Recommendations may be updated based on patient status, additional functional criteria and insurance authorization.  Follow Up Recommendations  Follow surgeon's recommendation for DC plan and follow-up therapies;Outpatient PT     Equipment Recommendations  Rolling walker with 5" wheels    Recommendations for Other Services       Precautions / Restrictions Precautions Precautions: Fall Restrictions Weight Bearing Restrictions: No LLE Weight Bearing: Weight bearing as tolerated Other Position/Activity Restrictions: WBAT     Mobility  Bed Mobility Overal bed mobility: Needs Assistance Bed Mobility: Supine to Sit     Supine to sit: Min guard;HOB elevated     General bed mobility comments: cues to use bed rail, pt able to bring LE's off EOB    Transfers Overall transfer level: Needs assistance Equipment used: Rolling walker (2 wheeled) Transfers: Sit to/from Stand Sit to Stand: Min guard         General transfer comment: pt with good carryover for hand placement with RW and no assist needed for power up. Cues for safe knee extension with sitting in  recliner.  Ambulation/Gait Ambulation/Gait assistance: Min guard Gait Distance (Feet): 100 Feet Assistive device: Rolling walker (2 wheeled) Gait Pattern/deviations: Step-to pattern;Decreased stride length;Decreased weight shift to left Gait velocity: decr   General Gait Details: cues to maintain safe position to RW, no overt LOB or buckling at Lt knee. pt preferring to rest Lt foot in plantar flexion with stepping due to pain with dorsiflexion (pt able to dorsiflex).   Stairs             Wheelchair Mobility    Modified Rankin (Stroke Patients Only)       Balance Overall balance assessment: Needs assistance Sitting-balance support: Feet supported Sitting balance-Leahy Scale: Good     Standing balance support: During functional activity;Bilateral upper extremity supported Standing balance-Leahy Scale: Fair                              Cognition Arousal/Alertness: Awake/alert Behavior During Therapy: WFL for tasks assessed/performed Overall Cognitive Status: Within Functional Limits for tasks assessed                                        Exercises Total Joint Exercises Ankle Circles/Pumps: AROM;Both;20 reps;Seated Quad Sets: AROM;Left;5 reps;Seated Heel Slides: AROM;Left;5 reps;Seated    General Comments        Pertinent Vitals/Pain Pain Assessment: 0-10 Pain Score: 5  Pain Location: Lt knee Pain Descriptors / Indicators: Aching;Discomfort Pain Intervention(s): Limited activity within patient's tolerance;Monitored during session;Repositioned;Premedicated before session;Ice applied    Home Living  Prior Function            PT Goals (current goals can now be found in the care plan section) Acute Rehab PT Goals 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 PT Goal Formulation: With patient Time For Goal Achievement: 12/27/20 Potential to Achieve Goals:  Good Progress towards PT goals: Progressing toward goals    Frequency    7X/week      PT Plan Current plan remains appropriate    Co-evaluation              AM-PAC PT "6 Clicks" Mobility   Outcome Measure  Help needed turning from your back to your side while in a flat bed without using bedrails?: A Little Help needed moving from lying on your back to sitting on the side of a flat bed without using bedrails?: A Little Help needed moving to and from a bed to a chair (including a wheelchair)?: A Little Help needed standing up from a chair using your arms (e.g., wheelchair or bedside chair)?: A Little Help needed to walk in hospital room?: A Little Help needed climbing 3-5 steps with a railing? : A Lot 6 Click Score: 17    End of Session Equipment Utilized During Treatment: Gait belt Activity Tolerance: Patient tolerated treatment well Patient left: in chair;with call bell/phone within reach;with chair alarm set;with family/visitor present Nurse Communication: Mobility status PT Visit Diagnosis: Muscle weakness (generalized) (M62.81);Difficulty in walking, not elsewhere classified (R26.2)     Time: 5726-2035 PT Time Calculation (min) (ACUTE ONLY): 27 min  Charges:  $Gait Training: 8-22 mins $Therapeutic Exercise: 8-22 mins                     Verner Mould, DPT Acute Rehabilitation Services Office [phone] Pager [phone]    Jacques Navy 12/21/2020, 1:06 PM

## 2020-12-21 NOTE — Progress Notes (Signed)
Physical Therapy Treatment Patient Details Name: [name] MRN: [id number] DOB: [birth year] Today's Date: 12/21/2020   History of Present Illness Patient is 55 y.o. female s/p Lt TKA on 12/20/20 with PMH significant for ADHD, back pain, DM, GERD, depression, psoriasis.    PT Comments    Patient progressing well and decreased pain this session. Patient was able to ambulate ~50' with guarding provided by pt's husband with supervision from therapist. No LOB throughout and pt/husband communicating well for mobility. Pt completed stair training with assist for walker management and education on use of crutch for support with one rail at home. Reviewed HEP for supine/seated exercises. Pt is at safe level for discharge home with assist from husband. Acute PT will continue to progress if she remains in acute setting.    Recommendations for follow up therapy are one component of a multi-disciplinary discharge planning process, led by the attending physician.  Recommendations may be updated based on patient status, additional functional criteria and insurance authorization.  Follow Up Recommendations  Follow surgeon's recommendation for DC plan and follow-up therapies;Outpatient PT     Equipment Recommendations  Rolling walker with 5" wheels    Recommendations for Other Services       Precautions / Restrictions Precautions Precautions: Fall Restrictions Weight Bearing Restrictions: No LLE Weight Bearing: Weight bearing as tolerated Other Position/Activity Restrictions: WBAT     Mobility  Bed Mobility Overal bed mobility: Needs Assistance Bed Mobility: Supine to Sit     Supine to sit: HOB elevated;Supervision     General bed mobility comments: pt taking increased time, no assist needed to move to EOB.    Transfers Overall transfer level: Needs assistance Equipment used: Rolling walker (2 wheeled) Transfers: Sit to/from Stand Sit to Stand: Supervision          General transfer comment: pt demonstrated safe technique for power-up and controlled lowering to EOB with supervision for safety.  Ambulation/Gait Ambulation/Gait assistance: Min guard;Supervision Gait Distance (Feet): 50 Feet Assistive device: Rolling walker (2 wheeled) Gait Pattern/deviations: Step-to pattern;Decreased stride length;Decreased weight shift to left Gait velocity: decr   General Gait Details: pt maintained safe position to RW and no LOB or buckling at Lt knee noted. pt's spouse provided safe guarding throughout  with supervision from therapist.   Stairs Stairs: Yes Stairs assistance: Min guard Stair Management: Two rails;Step to pattern;Forwards Number of Stairs: 3 General stair comments: cues for safe step pattern "up with good, down with bad" and for safe sequencing with crutch. pt's husband provided safe guarding with supervision from therapist.   Wheelchair Mobility    Modified Rankin (Stroke Patients Only)       Balance Overall balance assessment: Needs assistance Sitting-balance support: Feet supported Sitting balance-Leahy Scale: Good     Standing balance support: During functional activity;Bilateral upper extremity supported Standing balance-Leahy Scale: Fair                              Cognition Arousal/Alertness: Awake/alert Behavior During Therapy: WFL for tasks assessed/performed Overall Cognitive Status: Within Functional Limits for tasks assessed                                        Exercises Total Joint Exercises Ankle Circles/Pumps: AROM;Both;20 reps;Seated Long Arc Quad: AROM;Left;Other reps (comment);Seated Knee Flexion: AROM;Left;Other reps (comment);Seated;AAROM    General Comments  Pertinent Vitals/Pain Pain Assessment: 0-10 Pain Score: 5  Pain Location: Lt knee Pain Descriptors / Indicators: Aching;Discomfort Pain Intervention(s): Limited activity within patient's  tolerance;Monitored during session;Repositioned;Premedicated before session;Ice applied    Home Living                      Prior Function            PT Goals (current goals can now be found in the care plan section) Acute Rehab PT Goals 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 PT Goal Formulation: With patient Time For Goal Achievement: 12/27/20 Potential to Achieve Goals: Good Progress towards PT goals: Progressing toward goals    Frequency    7X/week      PT Plan Current plan remains appropriate    Co-evaluation              AM-PAC PT "6 Clicks" Mobility   Outcome Measure  Help needed turning from your back to your side while in a flat bed without using bedrails?: A Little Help needed moving from lying on your back to sitting on the side of a flat bed without using bedrails?: A Little Help needed moving to and from a bed to a chair (including a wheelchair)?: A Little Help needed standing up from a chair using your arms (e.g., wheelchair or bedside chair)?: A Little Help needed to walk in hospital room?: A Little Help needed climbing 3-5 steps with a railing? : A Lot 6 Click Score: 17    End of Session Equipment Utilized During Treatment: Gait belt Activity Tolerance: Patient tolerated treatment well Patient left: in chair;with call bell/phone within reach;with chair alarm set;with family/visitor present Nurse Communication: Mobility status PT Visit Diagnosis: Muscle weakness (generalized) (M62.81);Difficulty in walking, not elsewhere classified (R26.2)     Time: 5277-8242 PT Time Calculation (min) (ACUTE ONLY): 18 min  Charges:  $Gait Training: 8-22 mins                     [name], DPT Acute Rehabilitation Services Office [phone] Pager [phone]    Jacques Navy 12/21/2020, 1:32 PM

## 2020-12-21 NOTE — Progress Notes (Signed)
Patient discharged to home w/ husb. Given all belongings, instructions. Equipment at home. Verbalized understanding of all instructions. Escorted to pov via w/c.

## 2020-12-24 ENCOUNTER — Encounter (HOSPITAL_COMMUNITY): Payer: Self-pay | Admitting: Orthopedic Surgery

## 2020-12-29 NOTE — Discharge Summary (Signed)
Physician Discharge Summary   Patient ID: [name] MRN: [id number] DOB/AGE: [birth year] 55 y.o.  Admit date: 12/20/2020 Discharge date: 12/21/2020  Primary Diagnosis: Left knee osteoarthritis.  Admission Diagnoses:  Past Medical History:  Diagnosis Date   ADHD (attention deficit hyperactivity disorder)    ADHD (attention deficit hyperactivity disorder)    Arthritis    C. difficile diarrhea    Chronic back pain    Depression    Diabetes mellitus without complication (HCC)    Difficulty sleeping    Diverticulosis    GERD (gastroesophageal reflux disease)    Hypertension    Nephrolithiasis    Palpitations    PONV (postoperative nausea and vomiting)    Psoriasis    Right ureteral stone 09/14/2012   Sleep apnea    Tubular adenoma    colonoscopy 0/1751   Umbilical hernia    UTI (lower urinary tract infection)    Discharge Diagnoses:   Active Problems:   Status post knee replacement   S/P total knee arthroplasty, left  Estimated body mass index is 34.11 kg/m as calculated from the following:   Height as of this encounter: 5\' 5"  (1.651 m).   Weight as of this encounter: 93 kg.  Procedure:  Procedure(s) (LRB): TOTAL KNEE ARTHROPLASTY (Left)   Consults: None  HPI:  [name] is a 55 y.o. female patient of   mine.  The patient had been seen, evaluated, and treated for months conservatively in the   office with medication, activity modification, and injections.  The patient had   radiographic changes of bone-on-bone arthritis with endplate sclerosis and osteophytes noted.  Based on the radiographic changes and failed conservative measures, the patient   decided to proceed with definitive treatment, total knee replacement.  Risks of infection, DVT, component failure, need for revision surgery, neurovascular injury were reviewed in the office setting.  The postop course was reviewed stressing the efforts to maximize post-operative satisfaction and function.   Consent was obtained for benefit of pain   relief.   Laboratory Data: Admission on 12/20/2020, Discharged on 12/21/2020  Component Date Value Ref Range Status   ABO/RH(D) 12/20/2020    Final                   Value:O POS Performed at [facility], [place] [address], [place], [place] [address]    Glucose-Capillary 12/20/2020 146 (A)  70 - 99 mg/dL Final   Glucose reference range applies only to samples taken after fasting for at least 8 hours.   Comment 1 12/20/2020 Notify RN   Final   Comment 2 12/20/2020 Document in Chart   Final   Glucose-Capillary 12/20/2020 161 (A)  70 - 99 mg/dL Final   Glucose reference range applies only to samples taken after fasting for at least 8 hours.   Glucose-Capillary 12/20/2020 171 (A)  70 - 99 mg/dL Final   Glucose reference range applies only to samples taken after fasting for at least 8 hours.   Glucose-Capillary 12/20/2020 159 (A)  70 - 99 mg/dL Final   Glucose reference range applies only to samples taken after fasting for at least 8 hours.   WBC 12/21/2020 9.0  4.0 - 10.5 K/uL Final   RBC 12/21/2020 3.48 (A)  3.87 - 5.11 MIL/uL Final   Hemoglobin 12/21/2020 10.8 (A)  12.0 - 15.0 g/dL Final   HCT 12/21/2020 30.8 (A)  36.0 - 46.0 % Final   MCV 12/21/2020 88.5  80.0 - 100.0  fL Final   MCH 12/21/2020 31.0  26.0 - 34.0 pg Final   MCHC 12/21/2020 35.1  30.0 - 36.0 g/dL Final   RDW 12/21/2020 12.4  11.5 - 15.5 % Final   Platelets 12/21/2020 142 (A)  150 - 400 K/uL Final   nRBC 12/21/2020 0.0  0.0 - 0.2 % Final   Performed at [facility], [place] [address], [place], [place] [address]   Sodium 12/21/2020 138  135 - 145 mmol/L Final   Potassium 12/21/2020 4.2  3.5 - 5.1 mmol/L Final   Chloride 12/21/2020 104  98 - 111 mmol/L Final   CO2 12/21/2020 26  22 - 32 mmol/L Final   Glucose, Bld 12/21/2020 140 (A)  70 - 99 mg/dL Final   Glucose reference range applies only to samples taken after fasting for at least 8 hours.    BUN 12/21/2020 16  6 - 20 mg/dL Final   Creatinine, Ser 12/21/2020 0.67  0.44 - 1.00 mg/dL Final   Calcium 12/21/2020 9.0  8.9 - 10.3 mg/dL Final   GFR, Estimated 12/21/2020 >60  >60 mL/min Final   Comment: (NOTE) Calculated using the CKD-EPI Creatinine Equation (2021)    Anion gap 12/21/2020 8  5 - 15 Final   Performed at [facility], [place] [address] [place], [place] [address]   Glucose-Capillary 12/20/2020 156 (A)  70 - 99 mg/dL Final   Glucose reference range applies only to samples taken after fasting for at least 8 hours.   Glucose-Capillary 12/21/2020 114 (A)  70 - 99 mg/dL Final   Glucose reference range applies only to samples taken after fasting for at least 8 hours.   Glucose-Capillary 12/21/2020 244 (A)  70 - 99 mg/dL Final   Glucose reference range applies only to samples taken after fasting for at least 8 hours.  Orders Only on 12/16/2020  Component Date Value Ref Range Status   SARS Coronavirus 2 12/16/2020 RESULT: NEGATIVE   Final   Comment: RESULT: NEGATIVESARS-CoV-2 INTERPRETATION:A NEGATIVE  test result means that SARS-CoV-2 RNA was not present in the specimen above the limit of detection of this test. This does not preclude a possible SARS-CoV-2 infection and should not be used as the  sole basis for patient management decisions. Negative results must be combined with clinical observations, patient history, and epidemiological information. Optimum specimen types and timing for peak viral levels during infections caused by SARS-CoV-2  have not been determined. Collection of multiple specimens or types of specimens may be necessary to detect virus. Improper specimen collection and handling, sequence variability under primers/probes, or organism present below the limit of detection may  lead to false negative results. Positive and negative predictive values of testing are highly dependent on prevalence. False negative test results are more likely when  prevalence of disease is high.The expected result is NEGATIVE.Fact S                          heet for  Healthcare Providers: LocalChronicle.no Sheet for Patients: SalonLookup.es Reference Range - Negative   Hospital Outpatient Visit on 12/16/2020  Component Date Value Ref Range Status   MRSA, PCR 12/16/2020 NEGATIVE  NEGATIVE Final   Staphylococcus aureus 12/16/2020 POSITIVE (A)  NEGATIVE Final   Comment: (NOTE) The Xpert SA Assay (FDA approved for NASAL specimens in patients 68 years of age and older), is one component of a comprehensive surveillance program. It is not intended to diagnose infection nor  to guide or monitor treatment. Performed at [facility], [place] [address], [place], [place] [address]    WBC 12/16/2020 7.2  4.0 - 10.5 K/uL Final   RBC 12/16/2020 4.77  3.87 - 5.11 MIL/uL Final   Hemoglobin 12/16/2020 14.4  12.0 - 15.0 g/dL Final   HCT 12/16/2020 44.1  36.0 - 46.0 % Final   MCV 12/16/2020 92.5  80.0 - 100.0 fL Final   MCH 12/16/2020 30.2  26.0 - 34.0 pg Final   MCHC 12/16/2020 32.7  30.0 - 36.0 g/dL Final   RDW 12/16/2020 12.8  11.5 - 15.5 % Final   Platelets 12/16/2020 208  150 - 400 K/uL Final   nRBC 12/16/2020 0.0  0.0 - 0.2 % Final   Performed at [facility], [place] [address] [place], [place] [address]   Sodium 12/16/2020 136  135 - 145 mmol/L Final   Potassium 12/16/2020 4.3  3.5 - 5.1 mmol/L Final   Chloride 12/16/2020 102  98 - 111 mmol/L Final   CO2 12/16/2020 23  22 - 32 mmol/L Final   Glucose, Bld 12/16/2020 167 (A)  70 - 99 mg/dL Final   Glucose reference range applies only to samples taken after fasting for at least 8 hours.   BUN 12/16/2020 17  6 - 20 mg/dL Final   Creatinine, Ser 12/16/2020 0.91  0.44 - 1.00 mg/dL Final   Calcium 12/16/2020 9.8  8.9 - 10.3 mg/dL Final   Total Protein 12/16/2020 7.4  6.5 - 8.1 g/dL Final   Albumin 12/16/2020 4.4  3.5 -  5.0 g/dL Final   AST 12/16/2020 45 (A)  15 - 41 U/L Final   ALT 12/16/2020 59 (A)  0 - 44 U/L Final   Alkaline Phosphatase 12/16/2020 71  38 - 126 U/L Final   Total Bilirubin 12/16/2020 0.5  0.3 - 1.2 mg/dL Final   GFR, Estimated 12/16/2020 >60  >60 mL/min Final   Comment: (NOTE) Calculated using the CKD-EPI Creatinine Equation (2021)    Anion gap 12/16/2020 11  5 - 15 Final   Performed at [facility], [place] [address], [place], [place] [address]   ABO/RH(D) 12/16/2020 O POS   Final   Antibody Screen 12/16/2020 NEG   Final   Sample Expiration 12/16/2020 12/23/2020,2359   Final   Extend sample reason 12/16/2020    Final                   Value:NO TRANSFUSIONS OR PREGNANCY IN THE PAST 3 MONTHS Performed at [place] [address], [place], [place] [address]    Hgb A1c MFr Bld 12/16/2020 6.8 (A)  4.8 - 5.6 % Final   Comment: (NOTE) Pre diabetes:          5.7%-6.4%  Diabetes:              >6.4%  Glycemic control for   <7.0% adults with diabetes    Mean Plasma Glucose 12/16/2020 148.46  mg/dL Final   Performed at [place] [address], [place], [place] [address]   Glucose-Capillary 12/16/2020 188 (A)  70 - 99 mg/dL Final   Glucose reference range applies only to samples taken after fasting for at least 8 hours.     X-Rays:No results found.  EKG: Orders placed or performed during the hospital encounter of 12/16/20   EKG 12-Lead   EKG 12-Lead     Hospital Course: [name] is a  55 y.o. who was admitted to [facility]. They were brought to the operating room on 12/20/2020 and underwent Procedure(s): TOTAL KNEE ARTHROPLASTY.  Patient tolerated the procedure well and was later transferred to the recovery room and then to the orthopaedic floor for postoperative care. They were given PO and IV analgesics for pain control following their surgery. They were given 24 hours of postoperative antibiotics of   Anti-infectives (From admission, onward)    Start     Dose/Rate Route Frequency Ordered Stop   12/20/20 1400  ceFAZolin (ANCEF) IVPB 2g/100 mL premix        2 g 200 mL/hr over 30 Minutes Intravenous Every 6 hours 12/20/20 1140 12/20/20 2157   12/20/20 0600  ceFAZolin (ANCEF) IVPB 2g/100 mL premix        2 g 200 mL/hr over 30 Minutes Intravenous On call to O.R. 12/20/20 0522 12/20/20 0754      and started on DVT prophylaxis in the form of Aspirin.   PT and OT were ordered for total joint protocol. Discharge planning consulted to help with postop disposition and equipment needs.  Patient had a good night on the evening of surgery. They started to get up OOB with therapy on POD #0. Pt was seen during rounds and was ready to go home pending progress with therapy.She worked with therapy on POD #1 and was meeting her goals. Pt was discharged to home later that day in stable condition.  Diet: Regular diet Activity: WBAT Follow-up: in 2 weeks Disposition: Home Discharged Condition: good   Discharge Instructions     Call MD / Call 911   Complete by: As directed    If you experience chest pain or shortness of breath, CALL 911 and be transported to the hospital emergency room.  If you develope a fever above 101 F, pus (white drainage) or increased drainage or redness at the wound, or calf pain, call your surgeon's office.   Change dressing   Complete by: As directed    Maintain surgical dressing until follow up in the clinic. If the edges start to pull up, may reinforce with tape. If the dressing is no longer working, may remove and cover with gauze and tape, but must keep the area dry and clean.  Call with any questions or concerns.   Constipation Prevention   Complete by: As directed    Drink plenty of fluids.  Prune juice may be helpful.  You may use a stool softener, such as Colace (over the counter) 100 mg twice a day.  Use MiraLax (over the counter) for constipation as needed.   Diet -  low sodium heart healthy   Complete by: As directed    Increase activity slowly as tolerated   Complete by: As directed    Weight bearing as tolerated with assist device (walker, cane, etc) as directed, use it as long as suggested by your surgeon or therapist, typically at least 4-6 weeks.   Post-operative opioid taper instructions:   Complete by: As directed    POST-OPERATIVE OPIOID TAPER INSTRUCTIONS: It is important to wean off of your opioid medication as soon as possible. If you do not need pain medication after your surgery it is ok to stop day one. Opioids include: Codeine, Hydrocodone(Norco, Vicodin), Oxycodone(Percocet, oxycontin) and hydromorphone amongst others.  Long term and even short term use of opiods can cause: Increased pain response Dependence Constipation Depression Respiratory depression And more.  Withdrawal symptoms can include Flu like symptoms Nausea,  vomiting And more Techniques to manage these symptoms Hydrate well Eat regular healthy meals Stay active Use relaxation techniques(deep breathing, meditating, yoga) Do Not substitute Alcohol to help with tapering If you have been on opioids for less than two weeks and do not have pain than it is ok to stop all together.  Plan to wean off of opioids This plan should start within one week post op of your joint replacement. Maintain the same interval or time between taking each dose and first decrease the dose.  Cut the total daily intake of opioids by one tablet each day Next start to increase the time between doses. The last dose that should be eliminated is the evening dose.      TED hose   Complete by: As directed    Use stockings (TED hose) for 2 weeks on both leg(s).  You may remove them at night for sleeping.      Allergies as of 12/21/2020       Reactions   Sulfa Antibiotics Hives, Swelling   Face swelling   Amoxicillin-pot Clavulanate Diarrhea   Other reaction(s): Myalgias (intolerance),  Other (See Comments)   Ciprofloxacin Other (See Comments)   Joint inflammation   Donepezil    Other reaction(s): Arthralgias (intolerance)   Flagyl [metronidazole] Other (See Comments)   Altered mental state   Ivp Dye [iodinated Diagnostic Agents] Hives   Specifically for ct scans, tolerates mri dye   Other Itching   Narcotic pain medicines cause itching but can be managed with benadryl   Metoclopramide Palpitations        Medication List     STOP taking these medications    diclofenac 75 MG EC tablet Commonly known as: VOLTAREN   traMADol 50 MG tablet Commonly known as: ULTRAM       TAKE these medications    acetaminophen 650 MG CR tablet Commonly known as: TYLENOL Take 1,300 mg by mouth 2 (two) times daily.   aspirin 81 MG chewable tablet Chew 1 tablet (81 mg total) by mouth 2 (two) times daily for 28 days.   buPROPion 300 MG 24 hr tablet Commonly known as: WELLBUTRIN XL Take 300 mg by mouth daily.   busPIRone 15 MG tablet Commonly known as: BUSPAR Take 15 mg by mouth in the morning and at bedtime.   conjugated estrogens vaginal cream Commonly known as: PREMARIN Place 1 applicator vaginally 2 (two) times a week.   docusate sodium 100 MG capsule Commonly known as: COLACE Take 1 capsule (100 mg total) by mouth 2 (two) times daily.   hydrocortisone 2.5 % cream Apply 1 application topically 2 (two) times daily as needed (irritation).   Melatonin 10 MG Caps Take 10 mg by mouth at bedtime as needed (sleep).   metFORMIN 500 MG tablet Commonly known as: GLUCOPHAGE Take 500 mg by mouth 2 (two) times daily.   methocarbamol 500 MG tablet Commonly known as: ROBAXIN Take 1 tablet (500 mg total) by mouth every 6 (six) hours as needed for muscle spasms.   methylphenidate 27 MG CR tablet Commonly known as: CONCERTA Take 27 mg by mouth daily.   metoprolol tartrate 50 MG tablet Commonly known as: LOPRESSOR Take 75 mg by mouth in the morning and at bedtime.    nortriptyline 50 MG capsule Commonly known as: PAMELOR Take 50 mg by mouth at bedtime.   Omega 3 1200 MG Caps Take 1,200 mg by mouth 2 (two) times daily.   omeprazole 40 MG capsule Commonly known  as: PRILOSEC Take 40 mg by mouth 2 (two) times daily.   ondansetron 4 MG tablet Commonly known as: ZOFRAN Take 1 tablet (4 mg total) by mouth every 6 (six) hours as needed for nausea.   oxyCODONE 5 MG immediate release tablet Commonly known as: Oxy IR/ROXICODONE Take 1 tablet (5 mg total) by mouth every 4 (four) hours as needed for severe pain.   polyethylene glycol 17 g packet Commonly known as: MIRALAX / GLYCOLAX Take 17 g by mouth daily as needed for mild constipation.   vortioxetine HBr 20 MG Tabs tablet Commonly known as: TRINTELLIX Take 20 mg by mouth daily.               Discharge Care Instructions  (From admission, onward)           Start     Ordered   12/21/20 0000  Change dressing       Comments: Maintain surgical dressing until follow up in the clinic. If the edges start to pull up, may reinforce with tape. If the dressing is no longer working, may remove and cover with gauze and tape, but must keep the area dry and clean.  Call with any questions or concerns.   12/21/20 0835            Follow-up Information     Paralee Cancel, MD. Schedule an appointment as soon as possible for a visit in 2 week(s).   Specialty: Orthopedic Surgery Contact information: [address] [place] [address] [phone]                 Signed: Griffith Citron, PA-C Orthopedic Surgery 12/29/2020, 3:20 PM

## 2021-08-24 ENCOUNTER — Encounter (HOSPITAL_COMMUNITY): Payer: Self-pay

## 2021-08-24 ENCOUNTER — Emergency Department (HOSPITAL_COMMUNITY)
Admission: EM | Admit: 2021-08-24 | Discharge: 2021-08-24 | Disposition: A | Discharge status: Home / Self Care | Payer: No Typology Code available for payment source | Attending: Emergency Medicine | Admitting: Emergency Medicine

## 2021-08-24 ENCOUNTER — Other Ambulatory Visit: Payer: Self-pay

## 2021-08-24 ENCOUNTER — Emergency Department (HOSPITAL_COMMUNITY): Payer: No Typology Code available for payment source

## 2021-08-24 DIAGNOSIS — R110 Nausea: Secondary | ICD-10-CM | POA: Insufficient documentation

## 2021-08-24 DIAGNOSIS — R1031 Right lower quadrant pain: Secondary | ICD-10-CM | POA: Diagnosis not present

## 2021-08-24 DIAGNOSIS — R109 Unspecified abdominal pain: Secondary | ICD-10-CM | POA: Diagnosis present

## 2021-08-24 DIAGNOSIS — Z79899 Other long term (current) drug therapy: Secondary | ICD-10-CM | POA: Insufficient documentation

## 2021-08-24 DIAGNOSIS — K219 Gastro-esophageal reflux disease without esophagitis: Secondary | ICD-10-CM | POA: Insufficient documentation

## 2021-08-24 DIAGNOSIS — Z96652 Presence of left artificial knee joint: Secondary | ICD-10-CM | POA: Insufficient documentation

## 2021-08-24 DIAGNOSIS — E119 Type 2 diabetes mellitus without complications: Secondary | ICD-10-CM | POA: Insufficient documentation

## 2021-08-24 DIAGNOSIS — R1032 Left lower quadrant pain: Secondary | ICD-10-CM | POA: Diagnosis not present

## 2021-08-24 DIAGNOSIS — I10 Essential (primary) hypertension: Secondary | ICD-10-CM | POA: Insufficient documentation

## 2021-08-24 DIAGNOSIS — N179 Acute kidney failure, unspecified: Secondary | ICD-10-CM

## 2021-08-24 DIAGNOSIS — R1012 Left upper quadrant pain: Secondary | ICD-10-CM | POA: Insufficient documentation

## 2021-08-24 DIAGNOSIS — Z7984 Long term (current) use of oral hypoglycemic drugs: Secondary | ICD-10-CM | POA: Insufficient documentation

## 2021-08-24 DIAGNOSIS — R197 Diarrhea, unspecified: Secondary | ICD-10-CM | POA: Diagnosis not present

## 2021-08-24 LAB — URINALYSIS, ROUTINE W REFLEX MICROSCOPIC
Bilirubin Urine: NEGATIVE
Glucose, UA: NEGATIVE mg/dL
Hgb urine dipstick: NEGATIVE
Ketones, ur: NEGATIVE mg/dL
Leukocytes,Ua: NEGATIVE
Nitrite: NEGATIVE
Protein, ur: NEGATIVE mg/dL
Specific Gravity, Urine: 1.006 (ref 1.005–1.030)
pH: 5 (ref 5.0–8.0)

## 2021-08-24 LAB — CBC
HCT: 43.9 % (ref 36.0–46.0)
Hemoglobin: 14.6 g/dL (ref 12.0–15.0)
MCH: 29.9 pg (ref 26.0–34.0)
MCHC: 33.3 g/dL (ref 30.0–36.0)
MCV: 90 fL (ref 80.0–100.0)
Platelets: 240 10*3/uL (ref 150–400)
RBC: 4.88 MIL/uL (ref 3.87–5.11)
RDW: 12.9 % (ref 11.5–15.5)
WBC: 8 10*3/uL (ref 4.0–10.5)
nRBC: 0 % (ref 0.0–0.2)

## 2021-08-24 LAB — COMPREHENSIVE METABOLIC PANEL
ALT: 56 U/L — ABNORMAL HIGH (ref 0–44)
AST: 40 U/L (ref 15–41)
Albumin: 4.7 g/dL (ref 3.5–5.0)
Alkaline Phosphatase: 77 U/L (ref 38–126)
Anion gap: 11 (ref 5–15)
BUN: 20 mg/dL (ref 6–20)
CO2: 23 mmol/L (ref 22–32)
Calcium: 10 mg/dL (ref 8.9–10.3)
Chloride: 103 mmol/L (ref 98–111)
Creatinine, Ser: 1.27 mg/dL — ABNORMAL HIGH (ref 0.44–1.00)
GFR, Estimated: 50 mL/min — ABNORMAL LOW (ref >60)
Glucose, Bld: 132 mg/dL — ABNORMAL HIGH (ref 70–99)
Potassium: 4 mmol/L (ref 3.5–5.1)
Sodium: 137 mmol/L (ref 135–145)
Total Bilirubin: 0.7 mg/dL (ref 0.3–1.2)
Total Protein: 8.2 g/dL — ABNORMAL HIGH (ref 6.5–8.1)

## 2021-08-24 LAB — C DIFFICILE QUICK SCREEN W PCR REFLEX
C Diff antigen: NEGATIVE
C Diff interpretation: NOT DETECTED
C Diff toxin: NEGATIVE

## 2021-08-24 LAB — LIPASE, BLOOD: Lipase: 33 U/L (ref 11–51)

## 2021-08-24 MED ORDER — FENTANYL CITRATE PF 50 MCG/ML IJ SOSY
50.0000 ug | PREFILLED_SYRINGE | Freq: Once | INTRAMUSCULAR | Status: AC
  Administered 2021-08-24: 50 ug via INTRAVENOUS
  Filled 2021-08-24: qty 1

## 2021-08-24 MED ORDER — LACTATED RINGERS IV BOLUS
1000.0000 mL | Freq: Once | INTRAVENOUS | Status: AC
  Administered 2021-08-24: 1000 mL via INTRAVENOUS

## 2021-08-24 MED ORDER — CYCLOBENZAPRINE HCL 10 MG PO TABS: 10.0000 mg | ORAL_TABLET | Freq: Three times a day (TID) | ORAL | 0 refills | Status: DC | PRN

## 2021-08-24 MED ORDER — ONDANSETRON HCL 4 MG/2ML IJ SOLN
4.0000 mg | Freq: Once | INTRAMUSCULAR | Status: AC
  Administered 2021-08-24: 4 mg via INTRAVENOUS
  Filled 2021-08-24: qty 2

## 2021-08-24 MED ORDER — DIPHENHYDRAMINE HCL 50 MG/ML IJ SOLN
25.0000 mg | Freq: Once | INTRAMUSCULAR | Status: AC
  Administered 2021-08-24: 25 mg via INTRAVENOUS
  Filled 2021-08-24: qty 1

## 2021-08-24 MED ORDER — ONDANSETRON 4 MG PO TBDP: 4.0000 mg | ORAL_TABLET | Freq: Three times a day (TID) | ORAL | 0 refills | Status: DC | PRN

## 2021-08-24 MED ORDER — DICYCLOMINE HCL 10 MG/ML IM SOLN
20.0000 mg | Freq: Once | INTRAMUSCULAR | Status: AC
  Administered 2021-08-24: 20 mg via INTRAMUSCULAR
  Filled 2021-08-24: qty 2

## 2021-08-24 NOTE — ED Provider Notes (Signed)
[facility] DEPT Provider Note   CSN: [id number] Arrival date & time: 08/24/21  1449     History  Chief Complaint  Patient presents with   Abdominal Pain   Diarrhea    [name] is a 56 y.o. female.  HPI     56 year old female with a history of diabetes, hypertension, recurrent diverticulitis for which she had a hemicolectomy, with repeat diverticulitis following this, nephrolithiasis, OSA, who presents with concern for abdominal pain.  Reports about 10 days ago, she began to have left flank pain radiating towards the abdomen.  She initially thought this was a possible kidney stone, and was trying to stay hydrated and take cranberry juice, and after that she developed diarrhea.  Initially she thought the diarrhea was secondary to the cranberry juice, however continued.  Reports she has been having diarrhea every time she eats and at least 15 times daily for the last 9 days.  She has had associated nausea, but no vomiting.  Denies fever, dysuria, vaginal bleeding or discharge.  Reports the abdominal pain is located in the left flank and left upper quadrant, and she has cramping pain in her bilateral lower abdomen before she has bowel movements.  Denies black or bloody stools.  Tried zofran and bentyl without improvement.   She denies recent antibiotics, travel, suspicious foods or sick contacts.  She works as a Sports coach.  Denies chest pain, dyspnea, cough.   Past Medical History:  Diagnosis Date   ADHD (attention deficit hyperactivity disorder)    ADHD (attention deficit hyperactivity disorder)    Arthritis    C. difficile diarrhea    Chronic back pain    Depression    Diabetes mellitus without complication (HCC)    Difficulty sleeping    Diverticulosis    GERD (gastroesophageal reflux disease)    Hypertension    Nephrolithiasis    Palpitations    PONV (postoperative nausea and vomiting)    Psoriasis    Right ureteral stone  09/14/2012   Sleep apnea    Tubular adenoma    colonoscopy [id number]   Umbilical hernia    UTI (lower urinary tract infection)     Past Surgical History:  Procedure Laterality Date   ANTERIOR CRUCIATE LIGAMENT REPAIR     ANTERIOR CRUCIATE LIGAMENT REPAIR     CHOLECYSTECTOMY     COLONOSCOPY  03/2013   CYSTOSCOPY WITH RETROGRADE PYELOGRAM, URETEROSCOPY AND STENT PLACEMENT     CYSTOSCOPY WITH RETROGRADE PYELOGRAM, URETEROSCOPY AND STENT PLACEMENT     CYSTOSCOPY WITH RETROGRADE PYELOGRAM, URETEROSCOPY AND STENT PLACEMENT     CYSTOSCOPY WITH URETEROSCOPY N/A 09/14/2012   Procedure: CYSTOSCOPY WITH URETEROSCOPY, with  stent placement;  Surgeon: Malka So, MD;  Location: [facility];  Service: Urology;  Laterality: N/A;   CYSTOSCOPY/RETROGRADE/URETEROSCOPY/STONE EXTRACTION WITH BASKET Left 11/10/2013   Procedure: LEFT URETEROSCOPY, CYSTOSCOPY;  Surgeon: Malka So, MD;  Location: [facility];  Service: Urology;  Laterality: Left;   PARTIAL COLECTOMY     TOTAL KNEE ARTHROPLASTY Left 12/20/2020   Procedure: TOTAL KNEE ARTHROPLASTY;  Surgeon: Paralee Cancel, MD;  Location: [facility];  Service: Orthopedics;  Laterality: Left;   WRIST SURGERY       Home Medications Prior to Admission medications   Medication Sig Start Date End Date Taking? Authorizing Provider  acetaminophen (TYLENOL) 650 MG CR tablet Take 1,300 mg by mouth 2 (two) times daily.    [provider]  buPROPion (WELLBUTRIN XL) 300 MG 24 hr  tablet Take 300 mg by mouth daily. 04/06/16   [provider]  busPIRone (BUSPAR) 15 MG tablet Take 15 mg by mouth in the morning and at bedtime. 02/21/17   [provider]  conjugated estrogens (PREMARIN) vaginal cream Place 1 applicator vaginally 2 (two) times a week.    [provider]  docusate sodium (COLACE) 100 MG capsule Take 1 capsule (100 mg total) by mouth 2 (two) times daily. 12/21/20   Irving Copas, PA-C  hydrocortisone 2.5 % cream Apply 1 application topically  2 (two) times daily as needed (irritation).    [provider]  Melatonin 10 MG CAPS Take 10 mg by mouth at bedtime as needed (sleep).    [provider]  metFORMIN (GLUCOPHAGE) 500 MG tablet Take 500 mg by mouth 2 (two) times daily. 04/14/16   [provider]  methocarbamol (ROBAXIN) 500 MG tablet Take 1 tablet (500 mg total) by mouth every 6 (six) hours as needed for muscle spasms. 12/21/20   Irving Copas, PA-C  methylphenidate 27 MG PO CR tablet Take 27 mg by mouth daily.    [provider]  metoprolol tartrate (LOPRESSOR) 50 MG tablet Take 75 mg by mouth in the morning and at bedtime. 02/21/17   [provider]  nortriptyline (PAMELOR) 50 MG capsule Take 50 mg by mouth at bedtime.    [provider]  Omega 3 1200 MG CAPS Take 1,200 mg by mouth 2 (two) times daily.    [provider]  omeprazole (PRILOSEC) 40 MG capsule Take 40 mg by mouth 2 (two) times daily.    [provider]  ondansetron (ZOFRAN) 4 MG tablet Take 1 tablet (4 mg total) by mouth every 6 (six) hours as needed for nausea. 12/21/20   Irving Copas, PA-C  oxyCODONE (OXY IR/ROXICODONE) 5 MG immediate release tablet Take 1 tablet (5 mg total) by mouth every 4 (four) hours as needed for severe pain. 12/21/20   Irving Copas, PA-C  polyethylene glycol (MIRALAX / GLYCOLAX) 17 g packet Take 17 g by mouth daily as needed for mild constipation. 12/21/20   Irving Copas, PA-C  vortioxetine HBr (TRINTELLIX) 20 MG TABS tablet Take 20 mg by mouth daily. 09/03/17   [provider]      Allergies    Sulfa antibiotics, Amoxicillin-pot clavulanate, Ciprofloxacin, Donepezil, Flagyl [metronidazole], Ivp dye [iodinated contrast media], Other, and Metoclopramide    Review of Systems   Review of Systems  Physical Exam Updated Vital Signs BP (!) 133/99   Pulse 92   Temp 98.7 F (37.1 C) (Oral)   Resp 18   Ht '5\' 5"'$  (1.651 m)   Wt 88.5 kg   SpO2 96%    BMI 32.45 kg/m  Physical Exam Vitals and nursing note reviewed.  Constitutional:      General: She is not in acute distress.    Appearance: She is well-developed. She is not diaphoretic.  HENT:     Head: Normocephalic and atraumatic.  Eyes:     Conjunctiva/sclera: Conjunctivae normal.  Cardiovascular:     Rate and Rhythm: Normal rate and regular rhythm.  Pulmonary:     Effort: Pulmonary effort is normal. No respiratory distress.  Abdominal:     General: There is no distension.     Palpations: Abdomen is soft.     Tenderness: There is abdominal tenderness in the right lower quadrant, left upper quadrant and left lower quadrant. There is no right CVA tenderness,  left CVA tenderness or guarding.  Musculoskeletal:        General: No tenderness.     Cervical back: Normal range of motion.  Skin:    General: Skin is warm and dry.     Findings: No erythema or rash.  Neurological:     Mental Status: She is alert and oriented to person, place, and time.     ED Results / Procedures / Treatments   Labs (all labs ordered are listed, but only abnormal results are displayed) Labs Reviewed  COMPREHENSIVE METABOLIC PANEL - Abnormal; Notable for the following components:      Result Value   Glucose, Bld 132 (*)    Creatinine, Ser 1.27 (*)    Total Protein 8.2 (*)    ALT 56 (*)    GFR, Estimated 50 (*)    All other components within normal limits  C DIFFICILE QUICK SCREEN W PCR REFLEX    GASTROINTESTINAL PANEL BY PCR, STOOL (REPLACES STOOL CULTURE)  LIPASE, BLOOD  CBC  URINALYSIS, ROUTINE W REFLEX MICROSCOPIC    EKG None  Radiology CT ABDOMEN PELVIS WO CONTRAST  Result Date: 08/24/2021 CLINICAL DATA:  Pain left lower quadrant of abdomen EXAM: CT ABDOMEN AND PELVIS WITHOUT CONTRAST TECHNIQUE: Multidetector CT imaging of the abdomen and pelvis was performed following the standard protocol without IV contrast. RADIATION DOSE REDUCTION: This exam was performed according to the  departmental dose-optimization program which includes automated exposure control, adjustment of the mA and/or kV according to patient size and/or use of iterative reconstruction technique. COMPARISON:  03/07/2020 FINDINGS: Lower chest: Unremarkable. Hepatobiliary: Liver measures 19.8 cm in length. No focal abnormality is seen. There is no dilation of bile ducts. Surgical clips are seen in gallbladder fossa. Pancreas: No focal abnormality is seen. Spleen: Spleen is not enlarged. There is 5 mm low-density in the spleen which is too small to optimally characterize. This may suggest cyst or hemangioma. Adrenals/Urinary Tract: There is 8 mm fat attenuation nodule in the left adrenal with no significant change possibly adrenal myelolipoma. There is no hydronephrosis. 3 mm calculus is seen in the lower pole of right kidney. Ureters are not dilated. Urinary bladder is unremarkable. Stomach/Bowel: Stomach is not distended. Small bowel loops are not dilated. Appendix is not dilated. Diverticula are seen in the colon without evidence of focal acute diverticulitis. Surgical staples are seen in the sigmoid colon. Vascular/Lymphatic: There are scattered arterial calcifications. Reproductive: Unremarkable. Other: There is no ascites or pneumoperitoneum. Small ventral hernia containing fat is seen superior to the umbilicus. There is small umbilical hernia containing fat. Musculoskeletal: Degenerative changes are noted in the lumbar spine with encroachment of neural foramina more so at L4-L5 and L5-S1 levels. IMPRESSION: There is no evidence of intestinal obstruction or pneumoperitoneum. There is no hydronephrosis. Appendix is not dilated. Scattered diverticula are seen in colon without signs of focal acute diverticulitis. 3 mm nonobstructing right renal stone is seen. Other findings as described in the body of the report. Electronically Signed   By: Elmer Picker M.D.   On: 08/24/2021 16:33    Procedures Procedures     Medications Ordered in ED Medications  lactated ringers bolus 1,000 mL (1,000 mLs Intravenous New Bag/Given 08/24/21 1556)  ondansetron (ZOFRAN) injection 4 mg (4 mg Intravenous Given 08/24/21 1550)  fentaNYL (SUBLIMAZE) injection 50 mcg (50 mcg Intravenous Given 08/24/21 1551)  diphenhydrAMINE (BENADRYL) injection 25 mg (25 mg Intravenous Given 08/24/21 1650)    ED Course/ Medical Decision Making/ A&P  Medical Decision Making Amount and/or Complexity of Data Reviewed Labs: ordered. Radiology: ordered.  Risk Prescription drug management.    56 year old female with a history of diabetes, hypertension, recurrent diverticulitis for which she had a hemicolectomy, with repeat diverticulitis following this, nephrolithiasis, OSA, who presents with concern for abdominal pain.  DDx includes appendicitis, pancreatitis, cholecystitis, pyelonephritis, nephrolithiasis, diverticulitis, SBO, perorated viscous, PID, ovarian torsion, ectopic pregnancy, and tuboovarian abscess.  Labs completed and interpreted by me show no sign of pancreatitis, hepatitis, leukocytosis, anemia.  Labs do show AKI with Cr 1.27--previous baseline .67--.9.  This is likely in setting of dehydration from diarrhea.   Urinalysis without signs of UTI.   Given nausea, hydration and pain medication.  CT completed shows no evidence of intestinal obstruction or pneumoperitoneum, no hydronephrosis, no diverticulitis.  Nonobstructing stone on right.  For AKI, given IV fluids. Recommend avoiding NSAIDs and [facility] for [facility].  Will give rx for flexeril given inability to take regular meloxicam at this time.   Suspect symptoms related to infectious diarrhea. Discussed possibility of empiric abx given duration of symptoms but decided to forego abx and await results of CDiff/GI pathogen panel.  Discussed with overall low suspicion for CDiff and particularly if testing for CDiff  returns negative may start imodium.            Final Clinical Impression(s) / ED Diagnoses Final diagnoses:  Diarrhea of presumed infectious origin  Left lower quadrant abdominal pain    Rx / DC Orders ED Discharge Orders     None         Gareth Morgan, MD 08/24/21 1836

## 2021-08-24 NOTE — ED Triage Notes (Signed)
Patient c/o left upper and lower abdominal pain x 2 weeks and diarrhea . Patient states she went to   her [facility] today and was sent to the ED for further evaluation. Patient reports a history of diverticulitis.

## 2021-08-25 LAB — GASTROINTESTINAL PANEL BY PCR, STOOL (REPLACES STOOL CULTURE)

## 2021-09-13 ENCOUNTER — Emergency Department (HOSPITAL_COMMUNITY): Payer: No Typology Code available for payment source

## 2021-09-13 ENCOUNTER — Encounter (HOSPITAL_COMMUNITY): Payer: Self-pay

## 2021-09-13 ENCOUNTER — Other Ambulatory Visit: Payer: Self-pay

## 2021-09-13 ENCOUNTER — Emergency Department (HOSPITAL_COMMUNITY)
Admission: EM | Admit: 2021-09-13 | Discharge: 2021-09-13 | Disposition: A | Discharge status: Home / Self Care | Payer: No Typology Code available for payment source | Attending: Emergency Medicine | Admitting: Emergency Medicine

## 2021-09-13 DIAGNOSIS — N200 Calculus of kidney: Secondary | ICD-10-CM | POA: Diagnosis not present

## 2021-09-13 DIAGNOSIS — R300 Dysuria: Secondary | ICD-10-CM | POA: Diagnosis present

## 2021-09-13 LAB — BASIC METABOLIC PANEL
Anion gap: 8 (ref 5–15)
BUN: 11 mg/dL (ref 6–20)
CO2: 25 mmol/L (ref 22–32)
Calcium: 9.4 mg/dL (ref 8.9–10.3)
Chloride: 105 mmol/L (ref 98–111)
Creatinine, Ser: 0.63 mg/dL (ref 0.44–1.00)
GFR, Estimated: >60 mL/min (ref >60)
Glucose, Bld: 130 mg/dL — ABNORMAL HIGH (ref 70–99)
Potassium: 4.1 mmol/L (ref 3.5–5.1)
Sodium: 138 mmol/L (ref 135–145)

## 2021-09-13 LAB — CBC WITH DIFFERENTIAL/PLATELET
Abs Immature Granulocytes: 0.03 10*3/uL (ref 0.00–0.07)
Basophils Absolute: 0 10*3/uL (ref 0.0–0.1)
Basophils Relative: 1 %
Eosinophils Absolute: 0.1 10*3/uL (ref 0.0–0.5)
Eosinophils Relative: 2 %
HCT: 37.3 % (ref 36.0–46.0)
Hemoglobin: 12.4 g/dL (ref 12.0–15.0)
Immature Granulocytes: 0 %
Lymphocytes Relative: 23 %
Lymphs Abs: 1.9 10*3/uL (ref 0.7–4.0)
MCH: 29.7 pg (ref 26.0–34.0)
MCHC: 33.2 g/dL (ref 30.0–36.0)
MCV: 89.2 fL (ref 80.0–100.0)
Monocytes Absolute: 0.5 10*3/uL (ref 0.1–1.0)
Monocytes Relative: 6 %
Neutro Abs: 5.8 10*3/uL (ref 1.7–7.7)
Neutrophils Relative %: 68 %
Platelets: 159 10*3/uL (ref 150–400)
RBC: 4.18 MIL/uL (ref 3.87–5.11)
RDW: 13.1 % (ref 11.5–15.5)
WBC: 8.4 10*3/uL (ref 4.0–10.5)
nRBC: 0 % (ref 0.0–0.2)

## 2021-09-13 LAB — I-STAT BETA HCG BLOOD, ED (MC, WL, AP ONLY): I-stat hCG, quantitative: <5 m[IU]/mL (ref <5)

## 2021-09-13 LAB — URINALYSIS, ROUTINE W REFLEX MICROSCOPIC
Bilirubin Urine: NEGATIVE
Glucose, UA: NEGATIVE mg/dL
Hgb urine dipstick: NEGATIVE
Ketones, ur: NEGATIVE mg/dL
Leukocytes,Ua: NEGATIVE
Nitrite: NEGATIVE
Protein, ur: NEGATIVE mg/dL
Specific Gravity, Urine: 1.003 — ABNORMAL LOW (ref 1.005–1.030)
pH: 6 (ref 5.0–8.0)

## 2021-09-13 MED ORDER — ONDANSETRON HCL 4 MG/2ML IJ SOLN
4.0000 mg | Freq: Once | INTRAMUSCULAR | Status: AC
  Administered 2021-09-13: 4 mg via INTRAVENOUS
  Filled 2021-09-13: qty 2

## 2021-09-13 MED ORDER — KETOROLAC TROMETHAMINE 30 MG/ML IJ SOLN
30.0000 mg | Freq: Once | INTRAMUSCULAR | Status: AC
  Administered 2021-09-13: 30 mg via INTRAVENOUS
  Filled 2021-09-13: qty 1

## 2021-09-13 MED ORDER — HYDROMORPHONE HCL 1 MG/ML IJ SOLN
0.5000 mg | Freq: Once | INTRAMUSCULAR | Status: AC
  Administered 2021-09-13: 0.5 mg via INTRAVENOUS
  Filled 2021-09-13: qty 1

## 2021-09-13 MED ORDER — DIPHENHYDRAMINE HCL 50 MG/ML IJ SOLN
25.0000 mg | Freq: Once | INTRAMUSCULAR | Status: AC
  Administered 2021-09-13: 25 mg via INTRAVENOUS
  Filled 2021-09-13: qty 1

## 2021-09-13 MED ORDER — ONDANSETRON 4 MG PO TBDP: 4.0000 mg | ORAL_TABLET | Freq: Three times a day (TID) | ORAL | 0 refills | Status: DC | PRN

## 2021-09-13 MED ORDER — HYDROMORPHONE HCL 1 MG/ML IJ SOLN
1.0000 mg | Freq: Once | INTRAMUSCULAR | Status: AC
  Administered 2021-09-13: 1 mg via INTRAVENOUS
  Filled 2021-09-13: qty 1

## 2021-09-13 MED ORDER — OXYCODONE HCL 5 MG PO TABS: 5.0000 mg | ORAL_TABLET | ORAL | 0 refills | Status: DC | PRN

## 2021-09-13 NOTE — ED Provider Notes (Signed)
[facility] Emergency Department Provider Note MRN:  [id number]  Arrival date & time: 09/13/21     Chief Complaint   Flank Pain   History of Present Illness   [name] is a 56 y.o. year-old female presents to the ED with chief complaint of right-sided flank pain.  She reports history of kidney stones.  States that the pain started 2 nights ago.  Pain has gradually worsened tonight.  She reports associated nausea, but denies vomiting.  She has taken Zofran.  She reports having needed urologic interventions for prior kidney stones.  She reports some dysuria and suprapubic pressure.  History provided by patient.   Review of Systems  Pertinent review of systems noted in HPI.    Physical Exam   Vitals:   09/13/21 0600 09/13/21 0630  BP: 138/82 139/74  Pulse: 82 82  Resp: 18 18  Temp:  98.2 F (36.8 C)  SpO2: 96% 96%    CONSTITUTIONAL:  uncomfortable-appearing, NAD NEURO:  Alert and oriented x 3, CN 3-12 grossly intact EYES:  eyes equal and reactive ENT/NECK:  Supple, no stridor  CARDIO:  normal rate, regular rhythm, appears well-perfused  PULM:  No respiratory distress, CTAB GI/GU:  non-distended, RLQ ttp MSK/SPINE:  No gross deformities, no edema, moves all extremities  SKIN:  no rash, atraumatic   *Additional and/or pertinent findings included in MDM below  Diagnostic and Interventional Summary    EKG Interpretation  Date/Time:    Ventricular Rate:    PR Interval:    QRS Duration:   QT Interval:    QTC Calculation:   R Axis:     Text Interpretation:         Labs Reviewed  BASIC METABOLIC PANEL - Abnormal; Notable for the following components:      Result Value   Glucose, Bld 130 (*)    All other components within normal limits  URINALYSIS, ROUTINE W REFLEX MICROSCOPIC - Abnormal; Notable for the following components:   Color, Urine WHITE AND CLOUDY (*)    APPearance CLOUDY (*)    Specific Gravity, Urine 1.003 (*)     All other components within normal limits  CBC WITH DIFFERENTIAL/PLATELET  I-STAT BETA HCG BLOOD, ED ([facility], [facility], [facility] ONLY)    CT Renal Stone Study  Final Result      Medications  HYDROmorphone (DILAUDID) injection 1 mg (1 mg Intravenous Given 09/13/21 0514)  ondansetron (ZOFRAN) injection 4 mg (4 mg Intravenous Given 09/13/21 0514)  ketorolac (TORADOL) 30 MG/ML injection 30 mg (30 mg Intravenous Given 09/13/21 0608)  diphenhydrAMINE (BENADRYL) injection 25 mg (25 mg Intravenous Given 09/13/21 0608)     Procedures  /  Critical Care Procedures  ED Course and Medical Decision Making  I have reviewed the triage vital signs, the nursing notes, and pertinent available records from the EMR.  Social Determinants Affecting Complexity of Care: Patient has no clinically significant social determinants affecting this chief complaint..   ED Course:   Patient here with right sided flank pain.  Top differential diagnoses include KS, UTI, appy. Medical Decision Making Patient here with right flank pain.  Has history of kidney stones.  CT and labs ordered.  Patient's pain has improved significantly with treatment in the ED.  We will plan for discharge with outpatient follow-up.  Patient advised to strain urine and follow-up with urology.  Amount and/or Complexity of Data Reviewed Labs: ordered.    Details: UA is not consistent with infection, pregnancy test  negative, no AKI Radiology: ordered and independent interpretation performed.    Details: CT notable for small UPJ stone  Risk Prescription drug management.     Consultants: No consultations were needed in caring for this patient.   Treatment and Plan: Emergency department workup does not suggest an emergent condition requiring admission or immediate intervention beyond  what has been performed at this time. The patient is safe for discharge and has  been instructed to return immediately for worsening symptoms, change in  symptoms or any other  concerns    Final Clinical Impressions(s) / ED Diagnoses     ICD-10-CM   1. Kidney stone  N20.0       ED Discharge Orders          Ordered    oxyCODONE (ROXICODONE) 5 MG immediate release tablet  Every 4 hours PRN        09/13/21 0615    ondansetron (ZOFRAN-ODT) 4 MG disintegrating tablet  Every 8 hours PRN        09/13/21 0615              Discharge Instructions Discussed with and Provided to Patient:   Discharge Instructions   None      Montine Circle, PA-C 09/13/21 0654    Jeanell Sparrow, DO 09/21/21 1859

## 2021-09-13 NOTE — ED Triage Notes (Signed)
Patient said she is moving a kidney stone. Flank pain to the right side. History of kidney stones in the past. No painful urination.

## 2021-09-26 ENCOUNTER — Emergency Department (HOSPITAL_COMMUNITY)
Admission: EM | Admit: 2021-09-26 | Discharge: 2021-09-26 | Disposition: A | Discharge status: Home / Self Care | Payer: No Typology Code available for payment source | Attending: Emergency Medicine | Admitting: Emergency Medicine

## 2021-09-26 ENCOUNTER — Encounter (HOSPITAL_COMMUNITY): Payer: Self-pay | Admitting: Emergency Medicine

## 2021-09-26 ENCOUNTER — Other Ambulatory Visit: Payer: Self-pay

## 2021-09-26 ENCOUNTER — Emergency Department (HOSPITAL_COMMUNITY): Payer: No Typology Code available for payment source

## 2021-09-26 DIAGNOSIS — R1011 Right upper quadrant pain: Secondary | ICD-10-CM | POA: Diagnosis present

## 2021-09-26 DIAGNOSIS — Z79899 Other long term (current) drug therapy: Secondary | ICD-10-CM | POA: Insufficient documentation

## 2021-09-26 DIAGNOSIS — N23 Unspecified renal colic: Secondary | ICD-10-CM | POA: Diagnosis not present

## 2021-09-26 DIAGNOSIS — Z7984 Long term (current) use of oral hypoglycemic drugs: Secondary | ICD-10-CM | POA: Insufficient documentation

## 2021-09-26 LAB — CBC WITH DIFFERENTIAL/PLATELET
Abs Immature Granulocytes: 0.05 10*3/uL (ref 0.00–0.07)
Basophils Absolute: 0 10*3/uL (ref 0.0–0.1)
Basophils Relative: 1 %
Eosinophils Absolute: 0.1 10*3/uL (ref 0.0–0.5)
Eosinophils Relative: 2 %
HCT: 40.8 % (ref 36.0–46.0)
Hemoglobin: 13.4 g/dL (ref 12.0–15.0)
Immature Granulocytes: 1 %
Lymphocytes Relative: 31 %
Lymphs Abs: 2.3 10*3/uL (ref 0.7–4.0)
MCH: 29.6 pg (ref 26.0–34.0)
MCHC: 32.8 g/dL (ref 30.0–36.0)
MCV: 90.3 fL (ref 80.0–100.0)
Monocytes Absolute: 0.4 10*3/uL (ref 0.1–1.0)
Monocytes Relative: 6 %
Neutro Abs: 4.6 10*3/uL (ref 1.7–7.7)
Neutrophils Relative %: 59 %
Platelets: 209 10*3/uL (ref 150–400)
RBC: 4.52 MIL/uL (ref 3.87–5.11)
RDW: 13.3 % (ref 11.5–15.5)
WBC: 7.6 10*3/uL (ref 4.0–10.5)
nRBC: 0 % (ref 0.0–0.2)

## 2021-09-26 LAB — COMPREHENSIVE METABOLIC PANEL
ALT: 42 U/L (ref 0–44)
AST: 35 U/L (ref 15–41)
Albumin: 4.2 g/dL (ref 3.5–5.0)
Alkaline Phosphatase: 72 U/L (ref 38–126)
Anion gap: 9 (ref 5–15)
BUN: 22 mg/dL — ABNORMAL HIGH (ref 6–20)
CO2: 26 mmol/L (ref 22–32)
Calcium: 9.7 mg/dL (ref 8.9–10.3)
Chloride: 103 mmol/L (ref 98–111)
Creatinine, Ser: 1.01 mg/dL — ABNORMAL HIGH (ref 0.44–1.00)
GFR, Estimated: >60 mL/min (ref >60)
Glucose, Bld: 127 mg/dL — ABNORMAL HIGH (ref 70–99)
Potassium: 4.9 mmol/L (ref 3.5–5.1)
Sodium: 138 mmol/L (ref 135–145)
Total Bilirubin: 0.4 mg/dL (ref 0.3–1.2)
Total Protein: 7.6 g/dL (ref 6.5–8.1)

## 2021-09-26 LAB — URINALYSIS, ROUTINE W REFLEX MICROSCOPIC
Bacteria, UA: NONE SEEN
Bilirubin Urine: NEGATIVE
Glucose, UA: NEGATIVE mg/dL
Hgb urine dipstick: NEGATIVE
Ketones, ur: NEGATIVE mg/dL
Leukocytes,Ua: NEGATIVE
Nitrite: NEGATIVE
Protein, ur: 30 mg/dL — AB
Specific Gravity, Urine: 1.033 — ABNORMAL HIGH (ref 1.005–1.030)
pH: 5 (ref 5.0–8.0)

## 2021-09-26 MED ORDER — DIPHENHYDRAMINE HCL 50 MG/ML IJ SOLN
12.5000 mg | Freq: Once | INTRAMUSCULAR | Status: AC
  Administered 2021-09-26: 12.5 mg via INTRAVENOUS
  Filled 2021-09-26: qty 1

## 2021-09-26 MED ORDER — HYDROMORPHONE HCL 1 MG/ML IJ SOLN
1.0000 mg | Freq: Once | INTRAMUSCULAR | Status: AC
  Administered 2021-09-26: 1 mg via INTRAVENOUS
  Filled 2021-09-26: qty 1

## 2021-09-26 MED ORDER — KETOROLAC TROMETHAMINE 30 MG/ML IJ SOLN
15.0000 mg | Freq: Once | INTRAMUSCULAR | Status: AC
  Administered 2021-09-26: 15 mg via INTRAVENOUS
  Filled 2021-09-26: qty 1

## 2021-09-26 MED ORDER — ONDANSETRON HCL 4 MG/2ML IJ SOLN
INTRAMUSCULAR | Status: AC
  Filled 2021-09-26: qty 2

## 2021-09-26 MED ORDER — OXYCODONE-ACETAMINOPHEN 5-325 MG PO TABS: 1.0000 | ORAL_TABLET | Freq: Four times a day (QID) | ORAL | 0 refills | Status: DC | PRN

## 2021-09-26 MED ORDER — NAPROXEN 375 MG PO TABS: 375.0000 mg | ORAL_TABLET | Freq: Two times a day (BID) | ORAL | 0 refills | Status: AC

## 2021-09-26 MED ORDER — ONDANSETRON 8 MG PO TBDP
8.0000 mg | ORAL_TABLET | Freq: Three times a day (TID) | ORAL | 0 refills | Status: DC | PRN
Start: 2021-09-26 — End: 2021-10-10

## 2021-09-26 MED ORDER — ONDANSETRON HCL 4 MG/2ML IJ SOLN
4.0000 mg | Freq: Once | INTRAMUSCULAR | Status: AC
  Administered 2021-09-26: 4 mg via INTRAVENOUS
  Filled 2021-09-26: qty 2

## 2021-09-26 NOTE — ED Provider Notes (Signed)
[facility] DEPT Provider Note   CSN: [id number] Arrival date & time: 09/26/21  1000     History  Chief Complaint  Patient presents with   Flank Pain    [name] is a 56 y.o. female.   Flank Pain    Patient presents ED with complaints of persistent right flank pain in the setting of known kidney stone.  Patient does have a history of prior kidney stones, UTI, diverticulosis, status post prior cystoscopies and ureteroscopy's with stent placement.  Patient was seen by PA Marlon Pel and Dr. Pearline Cables in the emergency room on July 5.  Patient was diagnosed with a right-sided ureteral stone at that time.  She was discharged home with outpatient follow-up.  Patient states she followed up with urology at the time she was feeling better.  The plan was to see if the stone passed stone.  Patient states she has been having increasing pain in the last day or so.  The pain was more intense and severe.  In the right flank area.  No fevers or chills  Home Medications Prior to Admission medications   Medication Sig Start Date End Date Taking? Authorizing Provider  naproxen (NAPROSYN) 375 MG tablet Take 1 tablet (375 mg total) by mouth 2 (two) times daily. 09/26/21  Yes Dorie Rank, MD  ondansetron (ZOFRAN-ODT) 8 MG disintegrating tablet Take 1 tablet (8 mg total) by mouth every 8 (eight) hours as needed for nausea or vomiting. 09/26/21  Yes Dorie Rank, MD  oxyCODONE-acetaminophen (PERCOCET/ROXICET) 5-325 MG tablet Take 1 tablet by mouth every 6 (six) hours as needed for severe pain. 09/26/21  Yes Dorie Rank, MD  acetaminophen (TYLENOL) 650 MG CR tablet Take 1,300 mg by mouth 2 (two) times daily.    [provider]  buPROPion (WELLBUTRIN XL) 300 MG 24 hr tablet Take 300 mg by mouth daily. 04/06/16   [provider]  busPIRone (BUSPAR) 15 MG tablet Take 15 mg by mouth in the morning and at bedtime. 02/21/17   [provider]  conjugated estrogens  (PREMARIN) vaginal cream Place 1 applicator vaginally 2 (two) times a week.    [provider]  cyclobenzaprine (FLEXERIL) 10 MG tablet Take 1 tablet (10 mg total) by mouth 3 (three) times daily as needed for muscle spasms. 08/24/21   Gareth Morgan, MD  docusate sodium (COLACE) 100 MG capsule Take 1 capsule (100 mg total) by mouth 2 (two) times daily. 12/21/20   Irving Copas, PA-C  hydrocortisone 2.5 % cream Apply 1 application topically 2 (two) times daily as needed (irritation).    [provider]  Melatonin 10 MG CAPS Take 10 mg by mouth at bedtime as needed (sleep).    [provider]  metFORMIN (GLUCOPHAGE) 500 MG tablet Take 500 mg by mouth 2 (two) times daily. 04/14/16   [provider]  methocarbamol (ROBAXIN) 500 MG tablet Take 1 tablet (500 mg total) by mouth every 6 (six) hours as needed for muscle spasms. 12/21/20   Irving Copas, PA-C  methylphenidate 27 MG PO CR tablet Take 27 mg by mouth daily.    [provider]  metoprolol tartrate (LOPRESSOR) 50 MG tablet Take 75 mg by mouth in the morning and at bedtime. 02/21/17   [provider]  nortriptyline (PAMELOR) 50 MG capsule Take 50 mg by mouth at bedtime.    [provider]  Omega 3 1200 MG CAPS Take 1,200 mg by mouth 2 (two) times daily.  [provider]  omeprazole (PRILOSEC) 40 MG capsule Take 40 mg by mouth 2 (two) times daily.    [provider]  ondansetron (ZOFRAN) 4 MG tablet Take 1 tablet (4 mg total) by mouth every 6 (six) hours as needed for nausea. 12/21/20   Irving Copas, PA-C  oxyCODONE (ROXICODONE) 5 MG immediate release tablet Take 1 tablet (5 mg total) by mouth every 4 (four) hours as needed for severe pain. 09/13/21   Montine Circle, PA-C  polyethylene glycol (MIRALAX / GLYCOLAX) 17 g packet Take 17 g by mouth daily as needed for mild constipation. 12/21/20   Irving Copas, PA-C  vortioxetine HBr (TRINTELLIX) 20 MG TABS  tablet Take 20 mg by mouth daily. 09/03/17   [provider]      Allergies    Sulfa antibiotics, Amoxicillin-pot clavulanate, Ciprofloxacin, Donepezil, Flagyl [metronidazole], Ivp dye [iodinated contrast media], Other, and Metoclopramide    Review of Systems   Review of Systems  Genitourinary:  Positive for flank pain.    Physical Exam Updated Vital Signs BP (!) 134/92 (BP Location: Left Arm)   Pulse 63   Temp 98.8 F (37.1 C) (Oral)   Resp 16   LMP 07/13/2013   SpO2 95%  Physical Exam Vitals and nursing note reviewed.  Constitutional:      General: She is not in acute distress.    Appearance: She is well-developed.  HENT:     Head: Normocephalic and atraumatic.     Right Ear: External ear normal.     Left Ear: External ear normal.  Eyes:     General: No scleral icterus.       Right eye: No discharge.        Left eye: No discharge.     Conjunctiva/sclera: Conjunctivae normal.  Neck:     Trachea: No tracheal deviation.  Cardiovascular:     Rate and Rhythm: Normal rate and regular rhythm.  Pulmonary:     Effort: Pulmonary effort is normal. No respiratory distress.     Breath sounds: Normal breath sounds. No stridor. No wheezing or rales.  Abdominal:     General: Bowel sounds are normal. There is no distension.     Palpations: Abdomen is soft.     Tenderness: There is abdominal tenderness. There is right CVA tenderness. There is no guarding or rebound.     Comments: Right upper quadrant  Musculoskeletal:        General: No tenderness or deformity.     Cervical back: Neck supple.  Skin:    General: Skin is warm and dry.     Findings: No rash.  Neurological:     General: No focal deficit present.     Mental Status: She is alert.     Cranial Nerves: No cranial nerve deficit (no facial droop, extraocular movements intact, no slurred speech).     Sensory: No sensory deficit.     Motor: No abnormal muscle tone or seizure activity.     Coordination:  Coordination normal.  Psychiatric:        Mood and Affect: Mood normal.     ED Results / Procedures / Treatments   Labs (all labs ordered are listed, but only abnormal results are displayed) Labs Reviewed  COMPREHENSIVE METABOLIC PANEL - Abnormal; Notable for the following components:      Result Value   Glucose, Bld 127 (*)    BUN 22 (*)    Creatinine, Ser 1.01 (*)  All other components within normal limits  CBC WITH DIFFERENTIAL/PLATELET  URINALYSIS, ROUTINE W REFLEX MICROSCOPIC    EKG None  Radiology CT Renal Stone Study  Result Date: 09/26/2021 CLINICAL DATA:  Flank pain, kidney stones suspected EXAM: CT ABDOMEN AND PELVIS WITHOUT CONTRAST TECHNIQUE: Multidetector CT imaging of the abdomen and pelvis was performed following the standard protocol without IV contrast. RADIATION DOSE REDUCTION: This exam was performed according to the departmental dose-optimization program which includes automated exposure control, adjustment of the mA and/or kV according to patient size and/or use of iterative reconstruction technique. COMPARISON:  09/13/2021 FINDINGS: Lower chest: No acute abnormality. Hepatobiliary: No focal liver abnormality is seen. Hepatic steatosis. Status post cholecystectomy. No biliary dilatation. Pancreas: Unremarkable. No pancreatic ductal dilatation or surrounding inflammatory changes. Spleen: Normal in size without significant abnormality. Adrenals/Urinary Tract: Adrenal glands are unremarkable. Slight distal migration of a previously seen punctuate calculus in the proximal right ureter, measuring no greater than 0.2 cm, which remains within the proximal third of the right ureter (series 2, image 47). Additional punctuate, nonobstructive calculi of the inferior pole the right kidney. Minimal right hydronephrosis, similar to prior. Benign bilateral parapelvic renal cysts. Bladder is unremarkable. Stomach/Bowel: Stomach is within normal limits. Appendix appears normal. No  evidence of bowel wall thickening, distention, or inflammatory changes. Descending and sigmoid diverticulosis. Status post sigmoid colon resection and reanastomosis. Vascular/Lymphatic: Aortic atherosclerosis. No enlarged abdominal or pelvic lymph nodes. Reproductive: No mass or other significant abnormality. Other: No abdominal wall hernia or abnormality. No ascites. Musculoskeletal: No acute or significant osseous findings. IMPRESSION: 1. Slight distal migration of a previously seen punctuate calculus in the proximal right ureter, measuring no greater than 0.2 cm, which remains within the proximal third of the right ureter. 2. Additional punctuate, nonobstructive calculi of the inferior pole the right kidney. 3. Minimal right hydronephrosis, similar to prior. 4. Status post sigmoid colon resection and reanastomosis. 5. Diverticulosis without evidence of acute diverticulitis. 6. Hepatic steatosis. Aortic Atherosclerosis (ICD10-I70.0). Electronically Signed   By: Delanna Ahmadi M.D.   On: 09/26/2021 11:01    Procedures Procedures    Medications Ordered in ED Medications  diphenhydrAMINE (BENADRYL) injection 12.5 mg (has no administration in time range)  HYDROmorphone (DILAUDID) injection 1 mg (1 mg Intravenous Given 09/26/21 1253)  ketorolac (TORADOL) 30 MG/ML injection 15 mg (15 mg Intravenous Given 09/26/21 1252)  ondansetron (ZOFRAN) injection 4 mg (0 mg Intravenous Hold 09/26/21 1249)  diphenhydrAMINE (BENADRYL) injection 12.5 mg (12.5 mg Intravenous Given 09/26/21 1305)    ED Course/ Medical Decision Making/ A&P Clinical Course as of 09/26/21 [place] Sep 26, 2021  1416 Pt feeling better after treatment.  Ready for discharge [JK]  1418 CBC with Differential Normal [JK]  1418 Comprehensive metabolic panel(!) Normal [JK]  1418 CT Renal Stone Study CT scan shows persistent ureteral stone [JK]    Clinical Course User Index [JK] Dorie Rank, MD                           Medical Decision  Making Problems Addressed: Ureteral colic: acute illness or injury that poses a threat to life or bodily functions  Amount and/or Complexity of Data Reviewed Labs:  Decision-making details documented in ED Course. Radiology:  Decision-making details documented in ED Course.  Risk Prescription drug management.   Patient presented to the ED for evaluation of persistent flank pain.  Recently diagnosed with a kidney stone.  No fevers or chills.  CT scan unfortunate confirms persistent ureteral stone.  It only has moved slightly.  Patient was treated with IV narcotics and Toradol.  Symptoms improved significantly.  Patient has followed up at the [place].  She will contact them to see if she is eligible for community referral.  [facility]        Final Clinical Impression(s) / ED Diagnoses Final diagnoses:  Ureteral colic    Rx / DC Orders ED Discharge Orders          Ordered    oxyCODONE-acetaminophen (PERCOCET/ROXICET) 5-325 MG tablet  Every 6 hours PRN        09/26/21 1422    naproxen (NAPROSYN) 375 MG tablet  2 times daily        09/26/21 1423    ondansetron (ZOFRAN-ODT) 8 MG disintegrating tablet  Every 8 hours PRN        09/26/21 1423              Dorie Rank, MD 09/26/21 1424

## 2021-09-26 NOTE — ED Notes (Signed)
Pt made aware that urine is needed

## 2021-09-26 NOTE — Discharge Instructions (Addendum)
Take the medications as needed for pain.  Follow-up with urology for further evaluation

## 2021-09-26 NOTE — ED Provider Triage Note (Signed)
Emergency Medicine Provider Triage Evaluation Note  [name] , a 56 y.o. female  was evaluated in triage.  Pt complains of right flank pain that began last week.  Evaluated at the [facility] urologist, sent here for worsening pain.  Also endorsing a subjective fever, nausea along with vomiting.  Currently on narcotic pain medication 50 mg although she does not recall the name..  Review of Systems  Positive: Nausea, vomiting, right flank pain Negative: Chest pain, shortness of breath  Physical Exam  BP (!) 132/94 (BP Location: Left Arm)   Pulse 67   Temp 98.8 F (37.1 C) (Oral)   Resp 16   LMP 07/13/2013   SpO2 99%  Gen:   Awake, no distress   Resp:  Normal effort  MSK:   Moves extremities without difficulty  Other:    Medical Decision Making  Medically screening exam initiated at 10:29 AM.  Appropriate orders placed.  RACHEL RISON was informed that the remainder of the evaluation will be completed by another provider, this initial triage assessment does not replace that evaluation, and the importance of remaining in the ED until their evaluation is complete.     Janeece Fitting, PA-C 09/26/21 1032

## 2021-09-26 NOTE — ED Triage Notes (Signed)
Pt reports she has a kidney stone. Pt reports her urologist sent her to ED today.

## 2021-10-07 ENCOUNTER — Emergency Department (HOSPITAL_COMMUNITY): Payer: No Typology Code available for payment source

## 2021-10-07 ENCOUNTER — Other Ambulatory Visit: Payer: Self-pay

## 2021-10-07 ENCOUNTER — Encounter (HOSPITAL_COMMUNITY)
Admission: EM | Disposition: A | Discharge status: Home / Self Care | Payer: Self-pay | Source: Home / Self Care | Attending: Internal Medicine

## 2021-10-07 ENCOUNTER — Inpatient Hospital Stay (HOSPITAL_COMMUNITY): Payer: No Typology Code available for payment source | Admitting: Certified Registered"

## 2021-10-07 ENCOUNTER — Encounter (HOSPITAL_COMMUNITY): Payer: Self-pay

## 2021-10-07 ENCOUNTER — Inpatient Hospital Stay (HOSPITAL_COMMUNITY)
Admission: EM | Admit: 2021-10-07 | Discharge: 2021-10-10 | DRG: 854 | Disposition: A | Discharge status: Home / Self Care | Payer: No Typology Code available for payment source | Attending: Internal Medicine | Admitting: Internal Medicine

## 2021-10-07 ENCOUNTER — Inpatient Hospital Stay (HOSPITAL_COMMUNITY): Payer: No Typology Code available for payment source

## 2021-10-07 DIAGNOSIS — D6959 Other secondary thrombocytopenia: Secondary | ICD-10-CM | POA: Diagnosis present

## 2021-10-07 DIAGNOSIS — G4700 Insomnia, unspecified: Secondary | ICD-10-CM | POA: Diagnosis present

## 2021-10-07 DIAGNOSIS — N136 Pyonephrosis: Secondary | ICD-10-CM | POA: Diagnosis present

## 2021-10-07 DIAGNOSIS — K219 Gastro-esophageal reflux disease without esophagitis: Secondary | ICD-10-CM | POA: Diagnosis present

## 2021-10-07 DIAGNOSIS — G4733 Obstructive sleep apnea (adult) (pediatric): Secondary | ICD-10-CM | POA: Diagnosis present

## 2021-10-07 DIAGNOSIS — A419 Sepsis, unspecified organism: Principal | ICD-10-CM | POA: Diagnosis present

## 2021-10-07 DIAGNOSIS — F32A Depression, unspecified: Secondary | ICD-10-CM | POA: Diagnosis present

## 2021-10-07 DIAGNOSIS — Z7984 Long term (current) use of oral hypoglycemic drugs: Secondary | ICD-10-CM

## 2021-10-07 DIAGNOSIS — Z20822 Contact with and (suspected) exposure to covid-19: Secondary | ICD-10-CM | POA: Diagnosis present

## 2021-10-07 DIAGNOSIS — Z79899 Other long term (current) drug therapy: Secondary | ICD-10-CM

## 2021-10-07 DIAGNOSIS — E872 Acidosis, unspecified: Secondary | ICD-10-CM | POA: Diagnosis present

## 2021-10-07 DIAGNOSIS — Z888 Allergy status to other drugs, medicaments and biological substances status: Secondary | ICD-10-CM

## 2021-10-07 DIAGNOSIS — M5136 Other intervertebral disc degeneration, lumbar region: Secondary | ICD-10-CM | POA: Diagnosis present

## 2021-10-07 DIAGNOSIS — B3731 Acute candidiasis of vulva and vagina: Secondary | ICD-10-CM | POA: Diagnosis present

## 2021-10-07 DIAGNOSIS — Z79818 Long term (current) use of other agents affecting estrogen receptors and estrogen levels: Secondary | ICD-10-CM

## 2021-10-07 DIAGNOSIS — B001 Herpesviral vesicular dermatitis: Secondary | ICD-10-CM | POA: Diagnosis present

## 2021-10-07 DIAGNOSIS — G8929 Other chronic pain: Secondary | ICD-10-CM | POA: Diagnosis present

## 2021-10-07 DIAGNOSIS — Z882 Allergy status to sulfonamides status: Secondary | ICD-10-CM

## 2021-10-07 DIAGNOSIS — N390 Urinary tract infection, site not specified: Secondary | ICD-10-CM

## 2021-10-07 DIAGNOSIS — D649 Anemia, unspecified: Secondary | ICD-10-CM | POA: Diagnosis present

## 2021-10-07 DIAGNOSIS — Z96652 Presence of left artificial knee joint: Secondary | ICD-10-CM | POA: Diagnosis present

## 2021-10-07 DIAGNOSIS — N202 Calculus of kidney with calculus of ureter: Secondary | ICD-10-CM | POA: Diagnosis present

## 2021-10-07 DIAGNOSIS — E119 Type 2 diabetes mellitus without complications: Secondary | ICD-10-CM | POA: Diagnosis present

## 2021-10-07 DIAGNOSIS — F901 Attention-deficit hyperactivity disorder, predominantly hyperactive type: Secondary | ICD-10-CM | POA: Diagnosis present

## 2021-10-07 DIAGNOSIS — I10 Essential (primary) hypertension: Secondary | ICD-10-CM | POA: Diagnosis present

## 2021-10-07 DIAGNOSIS — Z87442 Personal history of urinary calculi: Secondary | ICD-10-CM | POA: Diagnosis not present

## 2021-10-07 DIAGNOSIS — Z885 Allergy status to narcotic agent status: Secondary | ICD-10-CM

## 2021-10-07 DIAGNOSIS — Z880 Allergy status to penicillin: Secondary | ICD-10-CM | POA: Diagnosis not present

## 2021-10-07 DIAGNOSIS — N12 Tubulo-interstitial nephritis, not specified as acute or chronic: Secondary | ICD-10-CM | POA: Diagnosis present

## 2021-10-07 DIAGNOSIS — R6520 Severe sepsis without septic shock: Secondary | ICD-10-CM | POA: Diagnosis present

## 2021-10-07 DIAGNOSIS — Z91041 Radiographic dye allergy status: Secondary | ICD-10-CM | POA: Diagnosis not present

## 2021-10-07 DIAGNOSIS — N1330 Unspecified hydronephrosis: Secondary | ICD-10-CM | POA: Diagnosis present

## 2021-10-07 DIAGNOSIS — N201 Calculus of ureter: Secondary | ICD-10-CM

## 2021-10-07 DIAGNOSIS — F419 Anxiety disorder, unspecified: Secondary | ICD-10-CM | POA: Diagnosis present

## 2021-10-07 DIAGNOSIS — Z881 Allergy status to other antibiotic agents status: Secondary | ICD-10-CM

## 2021-10-07 DIAGNOSIS — R002 Palpitations: Secondary | ICD-10-CM | POA: Diagnosis present

## 2021-10-07 DIAGNOSIS — Z87440 Personal history of urinary (tract) infections: Secondary | ICD-10-CM

## 2021-10-07 HISTORY — PX: CYSTOSCOPY W/ URETERAL STENT PLACEMENT: SHX1429

## 2021-10-07 LAB — CBC WITH DIFFERENTIAL/PLATELET
Abs Immature Granulocytes: 0.03 10*3/uL (ref 0.00–0.07)
Basophils Absolute: 0 10*3/uL (ref 0.0–0.1)
Basophils Relative: 0 %
Eosinophils Absolute: 0.1 10*3/uL (ref 0.0–0.5)
Eosinophils Relative: 1 %
HCT: 38.1 % (ref 36.0–46.0)
Hemoglobin: 12.7 g/dL (ref 12.0–15.0)
Immature Granulocytes: 1 %
Lymphocytes Relative: 5 %
Lymphs Abs: 0.3 10*3/uL — ABNORMAL LOW (ref 0.7–4.0)
MCH: 30 pg (ref 26.0–34.0)
MCHC: 33.3 g/dL (ref 30.0–36.0)
MCV: 90.1 fL (ref 80.0–100.0)
Monocytes Absolute: 0.1 10*3/uL (ref 0.1–1.0)
Monocytes Relative: 1 %
Neutro Abs: 5.8 10*3/uL (ref 1.7–7.7)
Neutrophils Relative %: 92 %
Platelets: 166 10*3/uL (ref 150–400)
RBC: 4.23 MIL/uL (ref 3.87–5.11)
RDW: 13.4 % (ref 11.5–15.5)
WBC: 6.3 10*3/uL (ref 4.0–10.5)
nRBC: 0 % (ref 0.0–0.2)

## 2021-10-07 LAB — COMPREHENSIVE METABOLIC PANEL
ALT: 29 U/L (ref 0–44)
AST: 28 U/L (ref 15–41)
Albumin: 3.9 g/dL (ref 3.5–5.0)
Alkaline Phosphatase: 78 U/L (ref 38–126)
Anion gap: 14 (ref 5–15)
BUN: 18 mg/dL (ref 6–20)
CO2: 22 mmol/L (ref 22–32)
Calcium: 9.2 mg/dL (ref 8.9–10.3)
Chloride: 102 mmol/L (ref 98–111)
Creatinine, Ser: 1.02 mg/dL — ABNORMAL HIGH (ref 0.44–1.00)
GFR, Estimated: >60 mL/min (ref >60)
Glucose, Bld: 164 mg/dL — ABNORMAL HIGH (ref 70–99)
Potassium: 4.2 mmol/L (ref 3.5–5.1)
Sodium: 138 mmol/L (ref 135–145)
Total Bilirubin: 0.9 mg/dL (ref 0.3–1.2)
Total Protein: 7.1 g/dL (ref 6.5–8.1)

## 2021-10-07 LAB — URINALYSIS, ROUTINE W REFLEX MICROSCOPIC
RBC / HPF: >50 RBC/hpf — ABNORMAL HIGH (ref 0–5)
WBC, UA: >50 WBC/hpf — ABNORMAL HIGH (ref 0–5)

## 2021-10-07 LAB — GLUCOSE, CAPILLARY
Glucose-Capillary: 128 mg/dL — ABNORMAL HIGH (ref 70–99)
Glucose-Capillary: 204 mg/dL — ABNORMAL HIGH (ref 70–99)
Glucose-Capillary: 214 mg/dL — ABNORMAL HIGH (ref 70–99)

## 2021-10-07 LAB — LACTIC ACID, PLASMA
Lactic Acid, Venous: 4 mmol/L (ref 0.5–1.9)
Lactic Acid, Venous: 2.9 mmol/L (ref 0.5–1.9)
Lactic Acid, Venous: 2.4 mmol/L (ref 0.5–1.9)

## 2021-10-07 LAB — PROTIME-INR
INR: 1 (ref 0.8–1.2)
Prothrombin Time: 13.2 seconds (ref 11.4–15.2)

## 2021-10-07 LAB — SARS CORONAVIRUS 2 BY RT PCR: SARS Coronavirus 2 by RT PCR: NEGATIVE

## 2021-10-07 SURGERY — CYSTOSCOPY, WITH RETROGRADE PYELOGRAM AND URETERAL STENT INSERTION
Anesthesia: General | Site: Ureter | Laterality: Right

## 2021-10-07 MED ORDER — OXYCODONE HCL 5 MG PO TABS: 5.0000 mg | ORAL_TABLET | Freq: Once | ORAL | Status: DC | PRN

## 2021-10-07 MED ORDER — PANTOPRAZOLE SODIUM 40 MG PO TBEC
40.0000 mg | DELAYED_RELEASE_TABLET | Freq: Every day | ORAL | Status: DC
  Administered 2021-10-07 – 2021-10-10 (×4): 40 mg via ORAL
  Filled 2021-10-07 (×4): qty 1

## 2021-10-07 MED ORDER — KETOROLAC TROMETHAMINE 15 MG/ML IJ SOLN
15.0000 mg | Freq: Once | INTRAMUSCULAR | Status: AC
  Administered 2021-10-07: 15 mg via INTRAVENOUS
  Filled 2021-10-07: qty 1

## 2021-10-07 MED ORDER — FENTANYL CITRATE (PF) 100 MCG/2ML IJ SOLN
INTRAMUSCULAR | Status: AC
  Filled 2021-10-07: qty 2

## 2021-10-07 MED ORDER — ONDANSETRON HCL 4 MG/2ML IJ SOLN
INTRAMUSCULAR | Status: AC
  Filled 2021-10-07: qty 2

## 2021-10-07 MED ORDER — BUSPIRONE HCL 5 MG PO TABS
15.0000 mg | ORAL_TABLET | Freq: Two times a day (BID) | ORAL | Status: DC
  Administered 2021-10-07 – 2021-10-10 (×6): 15 mg via ORAL
  Filled 2021-10-07 (×6): qty 3

## 2021-10-07 MED ORDER — ONDANSETRON HCL 4 MG/2ML IJ SOLN
4.0000 mg | Freq: Four times a day (QID) | INTRAMUSCULAR | Status: DC | PRN
  Administered 2021-10-07 – 2021-10-09 (×3): 4 mg via INTRAVENOUS
  Filled 2021-10-07 (×2): qty 2

## 2021-10-07 MED ORDER — SODIUM CHLORIDE 0.9 % IV BOLUS: 500.0000 mL | Freq: Once | INTRAVENOUS | Status: DC

## 2021-10-07 MED ORDER — LACTATED RINGERS IV BOLUS
2000.0000 mL | Freq: Once | INTRAVENOUS | Status: AC
  Administered 2021-10-07: 2000 mL via INTRAVENOUS

## 2021-10-07 MED ORDER — OXYCODONE HCL 5 MG PO TABS
5.0000 mg | ORAL_TABLET | ORAL | Status: DC | PRN
  Administered 2021-10-07 – 2021-10-09 (×6): 5 mg via ORAL
  Filled 2021-10-07 (×7): qty 1

## 2021-10-07 MED ORDER — DIPHENHYDRAMINE HCL 50 MG/ML IJ SOLN
25.0000 mg | Freq: Four times a day (QID) | INTRAMUSCULAR | Status: AC | PRN
  Administered 2021-10-07 – 2021-10-08 (×2): 25 mg via INTRAVENOUS
  Filled 2021-10-07 (×2): qty 1

## 2021-10-07 MED ORDER — ALBUMIN HUMAN 5 % IV SOLN
12.5000 g | Freq: Once | INTRAVENOUS | Status: AC
  Administered 2021-10-07: 12.5 g via INTRAVENOUS

## 2021-10-07 MED ORDER — MIDAZOLAM HCL 2 MG/2ML IJ SOLN
INTRAMUSCULAR | Status: AC
  Filled 2021-10-07: qty 2

## 2021-10-07 MED ORDER — IOHEXOL 300 MG/ML  SOLN
INTRAMUSCULAR | Status: DC | PRN
  Administered 2021-10-07: 12 mL via URETHRAL

## 2021-10-07 MED ORDER — ACETAMINOPHEN 650 MG RE SUPP: 650.0000 mg | Freq: Four times a day (QID) | RECTAL | Status: DC | PRN

## 2021-10-07 MED ORDER — AMISULPRIDE (ANTIEMETIC) 5 MG/2ML IV SOLN: 10.0000 mg | Freq: Once | INTRAVENOUS | Status: DC | PRN

## 2021-10-07 MED ORDER — INSULIN ASPART 100 UNIT/ML IJ SOLN
0.0000 [IU] | Freq: Three times a day (TID) | INTRAMUSCULAR | Status: DC
  Administered 2021-10-07: 5 [IU] via SUBCUTANEOUS
  Administered 2021-10-08 (×3): 3 [IU] via SUBCUTANEOUS
  Administered 2021-10-09: 2 [IU] via SUBCUTANEOUS
  Administered 2021-10-09: 5 [IU] via SUBCUTANEOUS
  Administered 2021-10-10: 3 [IU] via SUBCUTANEOUS
  Administered 2021-10-10: 10 [IU] via SUBCUTANEOUS

## 2021-10-07 MED ORDER — PROPOFOL 10 MG/ML IV BOLUS
INTRAVENOUS | Status: AC
  Filled 2021-10-07: qty 20

## 2021-10-07 MED ORDER — SODIUM CHLORIDE 0.9 % IR SOLN
Status: DC | PRN
  Administered 2021-10-07: 1000 mL

## 2021-10-07 MED ORDER — NORTRIPTYLINE HCL 25 MG PO CAPS
50.0000 mg | ORAL_CAPSULE | Freq: Every day | ORAL | Status: DC
  Administered 2021-10-07 – 2021-10-09 (×3): 50 mg via ORAL
  Filled 2021-10-07 (×4): qty 2

## 2021-10-07 MED ORDER — PHENYLEPHRINE HCL (PRESSORS) 10 MG/ML IV SOLN
INTRAVENOUS | Status: AC
  Filled 2021-10-07: qty 1

## 2021-10-07 MED ORDER — LIDOCAINE 2% (20 MG/ML) 5 ML SYRINGE
INTRAMUSCULAR | Status: DC | PRN
  Administered 2021-10-07: 100 mg via INTRAVENOUS

## 2021-10-07 MED ORDER — ACETAMINOPHEN 325 MG PO TABS
650.0000 mg | ORAL_TABLET | Freq: Four times a day (QID) | ORAL | Status: DC | PRN
  Administered 2021-10-07 – 2021-10-09 (×5): 650 mg via ORAL
  Filled 2021-10-07 (×5): qty 2

## 2021-10-07 MED ORDER — SODIUM CHLORIDE 0.9 % IV SOLN
2.0000 g | Freq: Once | INTRAVENOUS | Status: AC
  Administered 2021-10-07: 2 g via INTRAVENOUS
  Filled 2021-10-07: qty 12.5

## 2021-10-07 MED ORDER — ONDANSETRON HCL 4 MG PO TABS: 4.0000 mg | ORAL_TABLET | Freq: Four times a day (QID) | ORAL | Status: DC | PRN

## 2021-10-07 MED ORDER — LACTATED RINGERS IV SOLN
INTRAVENOUS | Status: DC
Start: 2021-10-07 — End: 2021-10-08

## 2021-10-07 MED ORDER — ONDANSETRON HCL 4 MG/2ML IJ SOLN: 4.0000 mg | Freq: Once | INTRAMUSCULAR | Status: DC | PRN

## 2021-10-07 MED ORDER — ALBUMIN HUMAN 5 % IV SOLN
INTRAVENOUS | Status: AC
  Filled 2021-10-07: qty 250

## 2021-10-07 MED ORDER — DEXAMETHASONE SODIUM PHOSPHATE 10 MG/ML IJ SOLN
INTRAMUSCULAR | Status: DC | PRN
  Administered 2021-10-07: 10 mg via INTRAVENOUS

## 2021-10-07 MED ORDER — HYDROMORPHONE HCL 1 MG/ML IJ SOLN
1.0000 mg | INTRAMUSCULAR | Status: DC | PRN
  Administered 2021-10-07 – 2021-10-10 (×11): 1 mg via INTRAVENOUS
  Filled 2021-10-07 (×12): qty 1

## 2021-10-07 MED ORDER — SODIUM CHLORIDE 0.9 % IV SOLN
2.0000 g | Freq: Three times a day (TID) | INTRAVENOUS | Status: DC
  Administered 2021-10-07 – 2021-10-10 (×9): 2 g via INTRAVENOUS
  Filled 2021-10-07 (×10): qty 12.5

## 2021-10-07 MED ORDER — SODIUM CHLORIDE 0.9 % IR SOLN
Status: DC | PRN
  Administered 2021-10-07: 3000 mL

## 2021-10-07 MED ORDER — PROPOFOL 10 MG/ML IV BOLUS
INTRAVENOUS | Status: DC | PRN
  Administered 2021-10-07: 200 mg via INTRAVENOUS

## 2021-10-07 MED ORDER — SODIUM CHLORIDE 0.9 % IV SOLN
1.0000 g | Freq: Once | INTRAVENOUS | Status: AC
Start: 2021-10-07 — End: 2021-10-07
  Administered 2021-10-07: 1 g via INTRAVENOUS
  Filled 2021-10-07: qty 10

## 2021-10-07 MED ORDER — PHENYLEPHRINE HCL-NACL 20-0.9 MG/250ML-% IV SOLN
INTRAVENOUS | Status: DC | PRN
  Administered 2021-10-07: 40 ug/min via INTRAVENOUS

## 2021-10-07 MED ORDER — ONDANSETRON HCL 4 MG/2ML IJ SOLN
4.0000 mg | Freq: Once | INTRAMUSCULAR | Status: AC
  Administered 2021-10-07: 4 mg via INTRAVENOUS
  Filled 2021-10-07: qty 2

## 2021-10-07 MED ORDER — HYDROMORPHONE HCL 1 MG/ML IJ SOLN: 0.2500 mg | INTRAMUSCULAR | Status: DC | PRN

## 2021-10-07 MED ORDER — PHENYLEPHRINE HCL (PRESSORS) 10 MG/ML IV SOLN
INTRAVENOUS | Status: DC | PRN
  Administered 2021-10-07: 80 ug via INTRAVENOUS

## 2021-10-07 MED ORDER — MIDAZOLAM HCL 5 MG/5ML IJ SOLN
INTRAMUSCULAR | Status: DC | PRN
  Administered 2021-10-07: 2 mg via INTRAVENOUS

## 2021-10-07 MED ORDER — OXYCODONE HCL 5 MG/5ML PO SOLN: 5.0000 mg | Freq: Once | ORAL | Status: DC | PRN

## 2021-10-07 MED ORDER — HYDROMORPHONE HCL 1 MG/ML IJ SOLN
0.5000 mg | Freq: Once | INTRAMUSCULAR | Status: AC
  Administered 2021-10-07: 0.5 mg via INTRAVENOUS
  Filled 2021-10-07: qty 1

## 2021-10-07 MED ORDER — LACTATED RINGERS IV BOLUS (SEPSIS): 1000.0000 mL | Freq: Once | INTRAVENOUS | Status: DC

## 2021-10-07 MED ORDER — DEXAMETHASONE SODIUM PHOSPHATE 10 MG/ML IJ SOLN
INTRAMUSCULAR | Status: AC
  Filled 2021-10-07: qty 1

## 2021-10-07 SURGICAL SUPPLY — 26 items
BAG URO CATCHER STRL LF (MISCELLANEOUS) ×3 IMPLANT
BASKET ZERO TIP NITINOL 2.4FR (BASKET) IMPLANT
BSKT STON RTRVL ZERO TP 2.4FR (BASKET)
CATH URETL OPEN 5X70 (CATHETERS) ×3 IMPLANT
CLOTH BEACON ORANGE TIMEOUT ST (SAFETY) ×3 IMPLANT
EXTRACTOR STONE 1.7FRX115CM (UROLOGICAL SUPPLIES) IMPLANT
GLOVE BIOGEL PI IND STRL 7.0 (GLOVE) IMPLANT
GLOVE BIOGEL PI IND STRL 7.5 (GLOVE) IMPLANT
GLOVE BIOGEL PI INDICATOR 7.0 (GLOVE) ×1
GLOVE BIOGEL PI INDICATOR 7.5 (GLOVE) ×1
GLOVE ECLIPSE 6.5 STRL STRAW (GLOVE) ×1 IMPLANT
GLOVE SURG LX 7.5 STRW (GLOVE) ×1
GLOVE SURG LX STRL 7.5 STRW (GLOVE) ×2 IMPLANT
GOWN STRL REUS W/ TWL LRG LVL3 (GOWN DISPOSABLE) ×4 IMPLANT
GOWN STRL REUS W/ TWL XL LVL3 (GOWN DISPOSABLE) ×2 IMPLANT
GOWN STRL REUS W/TWL LRG LVL3 (GOWN DISPOSABLE) ×4
GOWN STRL REUS W/TWL XL LVL3 (GOWN DISPOSABLE) ×4
GUIDEWIRE ANG ZIPWIRE 038X150 (WIRE) IMPLANT
GUIDEWIRE STR DUAL SENSOR (WIRE) ×3 IMPLANT
MANIFOLD NEPTUNE II (INSTRUMENTS) ×3 IMPLANT
PACK CYSTO (CUSTOM PROCEDURE TRAY) ×3 IMPLANT
SHEATH NAVIGATOR HD 11/13X28 (SHEATH) IMPLANT
SHEATH NAVIGATOR HD 11/13X36 (SHEATH) IMPLANT
STENT URET 6FRX24 CONTOUR (STENTS) ×1 IMPLANT
TUBING CONNECTING 10 (TUBING) ×3 IMPLANT
TUBING UROLOGY SET (TUBING) ×3 IMPLANT

## 2021-10-07 NOTE — H&P (Signed)
I have been asked to see the patient by Dr. Tennis Must, for evaluation and management of right hydronephrosis with infection, pending sepsis.  History of present illness: 56 year old female status post right ureteroscopy at the [facility] Tuesday, July 26 who presents to the emergency department today after removing her stent 1 day early secondary to severe pain and subsequently developed fever to 104, tachycardia, and poorly controlled pain.  The patient had a right mid ureteral stone and underwent ureteroscopy for that.  She had a stent placed with a string.  She was told to remove it in 5 days, however she was having severe pain and thought that removing the stent early would help with her pain.  She was not having any fevers or associated dysuria or lightheadedness prior to the stent being pulled.  However, she subsequently developed acute onset fever and lightheadedness.  She was having some nausea and her pain was very poorly controlled.  In the emergency department a CT scan was performed demonstrating some mild right hydroureteronephrosis with a small stone fragment in the mid ureter and some periureteral edema.  She was noted to be febrile to 104.  Her heart rate was in the 130s and her blood pressure was soft.  She was given several fluid boluses and has responded.  The patient has a history of nephrolithiasis and has seen Dr. Jeffie Pollock from [place] in the past.  Review of systems: A 12 point comprehensive review of systems was obtained and is negative unless otherwise stated in the history of present illness.  Patient Active Problem List   Diagnosis Date Noted   Sepsis secondary to UTI ([place]) 10/07/2021   Hydronephrosis, right 10/07/2021   Hypertension 10/07/2021   Palpitations 10/07/2021   Status post knee replacement 12/20/2020   S/P total knee arthroplasty, left 12/20/2020   UTI (urinary tract infection) 03/07/2020   Diabetes mellitus type 2, noninsulin dependent ([place]) 03/07/2020   Sepsis  ([place]) 03/07/2020   Keratoconjunctivitis sicca of both eyes not specified as Sjogren's 02/25/2017   Anxiety with somatization 02/07/2017   Memory loss 06/22/2016   History of kidney stones 09/07/2015   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   Bulging lumbar disc 03/03/2015   Diverticulosis 03/03/2015   High risk medication use 03/03/2015   Attention-deficit hyperactivity disorder, predominantly hyperactive type 03/03/2015   Other malaise and fatigue 07/29/2013   Depression 07/29/2013   GERD (gastroesophageal reflux disease) 03/31/2013   History of left hemicolectomy 02/10/2013   History of Clostridium difficile colitis 02/10/2013   Left sided abdominal pain 02/10/2013   Renal stones 02/10/2013   History of diverticulitis of colon 02/10/2013   Unspecified constipation 01/07/2013   Right ureteral stone 09/14/2012    No current facility-administered medications on file prior to encounter.   Current Outpatient Medications on File Prior to Encounter  Medication Sig Dispense Refill   acetaminophen (TYLENOL) 500 MG tablet Take 1,000 mg by mouth every 6 (six) hours as needed (pain).     amphetamine-dextroamphetamine (ADDERALL XR) 20 MG 24 hr capsule Take 20 mg by mouth every morning.     buPROPion (WELLBUTRIN XL) 300 MG 24 hr tablet Take 300 mg by mouth every morning.     busPIRone (BUSPAR) 15 MG tablet Take 15 mg by mouth in the morning and at bedtime.     conjugated estrogens (PREMARIN) vaginal cream Place 1 applicator vaginally 2 (two) times a week.     dicyclomine (BENTYL) 10 MG capsule Take 10 mg by mouth 2 (two) times daily as  needed (cramps).     hydrocortisone 2.5 % cream Apply 1 application topically 2 (two) times daily as needed (irritation).     Melatonin 10 MG CAPS Take 10 mg by mouth at bedtime.     metFORMIN (GLUCOPHAGE) 500 MG tablet Take 500 mg by mouth 2 (two) times daily.     metoprolol tartrate (LOPRESSOR) 100 MG tablet Take 150 mg by mouth every morning.     naproxen (NAPROSYN)  375 MG tablet Take 1 tablet (375 mg total) by mouth 2 (two) times daily. 20 tablet 0   nortriptyline (PAMELOR) 50 MG capsule Take 50 mg by mouth at bedtime.     omeprazole (PRILOSEC) 40 MG capsule Take 40 mg by mouth 2 (two) times daily before a meal.     ondansetron (ZOFRAN) 8 MG tablet Take 8 mg by mouth every 8 (eight) hours as needed for nausea or vomiting.     oxybutynin (DITROPAN) 5 MG tablet Take 5 mg by mouth every 8 (eight) hours as needed for bladder spasms (pain related to kidney stones).     phenazopyridine (PYRIDIUM) 95 MG tablet Take 190 mg by mouth 3 (three) times daily as needed for pain.     tamsulosin (FLOMAX) 0.4 MG CAPS capsule Take 0.4 mg by mouth every morning.     vortioxetine HBr (TRINTELLIX) 20 MG TABS tablet Take 20 mg by mouth every morning.     cyclobenzaprine (FLEXERIL) 10 MG tablet Take 1 tablet (10 mg total) by mouth 3 (three) times daily as needed for muscle spasms. (Patient not taking: Reported on 10/07/2021) 20 tablet 0   meloxicam (MOBIC) 15 MG tablet Take 15 mg by mouth every morning. (Patient not taking: Reported on 10/07/2021)     ondansetron (ZOFRAN-ODT) 8 MG disintegrating tablet Take 1 tablet (8 mg total) by mouth every 8 (eight) hours as needed for nausea or vomiting. (Patient not taking: Reported on 10/07/2021) 12 tablet 0   oxyCODONE (ROXICODONE) 5 MG immediate release tablet Take 1 tablet (5 mg total) by mouth every 4 (four) hours as needed for severe pain. (Patient not taking: Reported on 10/07/2021) 15 tablet 0   oxyCODONE-acetaminophen (PERCOCET/ROXICET) 5-325 MG tablet Take 1 tablet by mouth every 6 (six) hours as needed for severe pain. (Patient not taking: Reported on 10/07/2021) 12 tablet 0    Past Medical History:  Diagnosis Date   ADHD (attention deficit hyperactivity disorder)    ADHD (attention deficit hyperactivity disorder)    Arthritis    C. difficile diarrhea    Chronic back pain    Depression    Diabetes mellitus without complication  (HCC)    Difficulty sleeping    Diverticulosis    GERD (gastroesophageal reflux disease)    Hypertension    Nephrolithiasis    Palpitations    PONV (postoperative nausea and vomiting)    Psoriasis    Right ureteral stone 09/14/2012   Sleep apnea    Tubular adenoma    colonoscopy 04/5850   Umbilical hernia    UTI (lower urinary tract infection)     Past Surgical History:  Procedure Laterality Date   ANTERIOR CRUCIATE LIGAMENT REPAIR     ANTERIOR CRUCIATE LIGAMENT REPAIR     CHOLECYSTECTOMY     COLONOSCOPY  03/2013   CYSTOSCOPY WITH RETROGRADE PYELOGRAM, URETEROSCOPY AND STENT PLACEMENT     CYSTOSCOPY WITH RETROGRADE PYELOGRAM, URETEROSCOPY AND STENT PLACEMENT     CYSTOSCOPY WITH RETROGRADE PYELOGRAM, URETEROSCOPY AND STENT PLACEMENT     CYSTOSCOPY WITH  URETEROSCOPY N/A 09/14/2012   Procedure: CYSTOSCOPY WITH URETEROSCOPY, with  stent placement;  Surgeon: Malka So, MD;  Location: [facility];  Service: Urology;  Laterality: N/A;   CYSTOSCOPY/RETROGRADE/URETEROSCOPY/STONE EXTRACTION WITH BASKET Left 11/10/2013   Procedure: LEFT URETEROSCOPY, CYSTOSCOPY;  Surgeon: Malka So, MD;  Location: [facility];  Service: Urology;  Laterality: Left;   PARTIAL COLECTOMY     TOTAL KNEE ARTHROPLASTY Left 12/20/2020   Procedure: TOTAL KNEE ARTHROPLASTY;  Surgeon: Paralee Cancel, MD;  Location: [facility];  Service: Orthopedics;  Laterality: Left;   WRIST SURGERY      Social History   Tobacco Use   Smoking status: Never   Smokeless tobacco: Never  Vaping Use   Vaping Use: Never used  Substance Use Topics   Alcohol use: Not Currently    Comment: rarely   Drug use: No    Family History  Problem Relation Age of Onset   Osteoarthritis Mother    Arthritis Mother    Asthma Mother    Osteoarthritis Maternal Grandmother    Osteoarthritis Maternal Grandfather    ADD / ADHD Son    ADD / ADHD Daughter    Colon cancer Neg Hx     PE: Vitals:   10/07/21 0751 10/07/21 0820 10/07/21 0850 10/07/21 1133   BP: 130/69 (!) 134/94  90/72  Pulse: (!) 157 (!) 140  (!) 131  Resp: 18 (!) 21  20  Temp: (!) 103.3 F (39.6 C) (!) 104.2 F (40.1 C)    TempSrc: Oral Rectal    SpO2: 98% 97%  93%  Weight:   90.7 kg    Patient appears to be in no acute distress  patient is alert and oriented x3 Atraumatic normocephalic head No cervical or supraclavicular lymphadenopathy appreciated No increased work of breathing, no audible wheezes/rhonchi Sinus tachycardia Abdomen is soft, nontender, nondistended, significant right-sided CVA and suprapubic tenderness Lower extremities are symmetric without appreciable edema Grossly neurologically intact No identifiable skin lesions  Recent Labs    10/07/21 0758  WBC 6.3  HGB 12.7  HCT 38.1   Recent Labs    10/07/21 0758  NA 138  K 4.2  CL 102  CO2 22  GLUCOSE 164*  BUN 18  CREATININE 1.02*  CALCIUM 9.2   Recent Labs    10/07/21 0758  INR 1.0   No results for input(s): "LABURIN" in the last 72 hours. Results for orders placed or performed during the hospital encounter of 10/07/21  SARS Coronavirus 2 by RT PCR (hospital order, performed in [facility] hospital lab) *cepheid single result test*     Status: None   Collection Time: 10/07/21  8:20 AM   Specimen: Nasal Swab  Result Value Ref Range Status   SARS Coronavirus 2 by RT PCR NEGATIVE NEGATIVE Final    Comment: (NOTE) SARS-CoV-2 target nucleic acids are NOT DETECTED.  The SARS-CoV-2 RNA is generally detectable in upper and lower respiratory specimens during the acute phase of infection. The lowest concentration of SARS-CoV-2 viral copies this assay can detect is 250 copies / mL. A negative result does not preclude SARS-CoV-2 infection and should not be used as the sole basis for treatment or other patient management decisions.  A negative result may occur with improper specimen collection / handling, submission of specimen other than nasopharyngeal swab, presence of viral mutation(s)  within the areas targeted by this assay, and inadequate number of viral copies (<250 copies / mL). A negative result must be combined with clinical  observations, patient history, and epidemiological information.  Fact Sheet for Patients:   https://www.patel.info/  Fact Sheet for Healthcare Providers: https://hall.com/  This test is not yet approved or  cleared by the [place] FDA and has been authorized for detection and/or diagnosis of SARS-CoV-2 by FDA under an Emergency Use Authorization (EUA).  This EUA will remain in effect (meaning this test can be used) for the duration of the COVID-19 declaration under Section 564(b)(1) of the Act, 21 U.S.C. section 360bbb-3(b)(1), unless the authorization is terminated or revoked sooner.  Performed at [facility], [place] [address] [place], [place] [address]     Imaging: Independently reviewed the patient's CT scan with the findings as described in the HPI.  I reviewed these findings with the patient in detail.  Imp: The patient has what appears to be pyelonephritis, right hydronephrosis, possible bacteremia and septic physiology secondary to poorly draining kidney secondary to ureteral edema and a small stone fragment from her recent ureteroscopy, laser lithotripsy.    Recommendations: Plan to proceed urgently to the operating room for a right ureteral stent placement.  I went through this procedure with the patient in detail and answered all her questions.  She has been through this before and had no additional questions.  Following the surgery the patient is being admitted to the hospital service for management of her hypotension and tachycardia.  Ardis Hughs

## 2021-10-07 NOTE — Anesthesia Preprocedure Evaluation (Signed)
Anesthesia Evaluation  Patient identified by MRN, date of birth, ID band Patient awake    Reviewed: Allergy & Precautions, NPO status , Patient's Chart, lab work & pertinent test results  History of Anesthesia Complications (+) PONV and history of anesthetic complications  Airway Mallampati: II  TM Distance: >3 FB Neck ROM: Full    Dental  (+) Dental Advisory Given, Teeth Intact   Pulmonary sleep apnea ,    Pulmonary exam normal        Cardiovascular hypertension, Normal cardiovascular exam     Neuro/Psych negative neurological ROS     GI/Hepatic Neg liver ROS, GERD  ,  Endo/Other  diabetes, Type 2, Oral Hypoglycemic Agents  Renal/GU Renal disease (R ureteral stone)  negative genitourinary   Musculoskeletal  (+) Arthritis ,   Abdominal   Peds  Hematology negative hematology ROS (+)   Anesthesia Other Findings   Reproductive/Obstetrics                            Anesthesia Physical Anesthesia Plan  ASA: 3  Anesthesia Plan: General   Post-op Pain Management:    Induction: Intravenous  PONV Risk Score and Plan: 4 or greater and Ondansetron, Dexamethasone, Treatment may vary due to age or medical condition and Midazolam  Airway Management Planned: LMA  Additional Equipment: None  Intra-op Plan:   Post-operative Plan: Extubation in OR  Informed Consent: I have reviewed the patients History and Physical, chart, labs and discussed the procedure including the risks, benefits and alternatives for the proposed anesthesia with the patient or authorized representative who has indicated his/her understanding and acceptance.     Dental advisory given  Plan Discussed with:   Anesthesia Plan Comments:        Anesthesia Quick Evaluation

## 2021-10-07 NOTE — Op Note (Signed)
Preoperative diagnosis:  Right ureteral obstruction   Postoperative diagnosis:  Same sepsis   Procedure:  Cystoscopy right ureteral stent placement right retrograde pyelography with interpretation   Surgeon: Ardis Hughs, MD  Anesthesia: General  Complications: None  Intraoperative findings:  1: Right retrograde pyelogram demonstrated a hydronephrotic ureter with poor filling of the collecting system, but no ureteral filling defect visualized. #2: A 24 cm time 6 French double-J ureteral stent was placed in the right ureter, stent tether was removed   EBL: Minimal  Specimens: None  Indication: [name] is a 56 y.o. patient with pending sepsis after removing her ureteral stent following stone surgery earlier this week. After reviewing the management options for treatment, he elected to proceed with the above surgical procedure(s). We have discussed the potential benefits and risks of the procedure, side effects of the proposed treatment, the likelihood of the patient achieving the goals of the procedure, and any potential problems that might occur during the procedure or recuperation. Informed consent has been obtained.  Description of procedure:  The patient was taken to the operating room and general anesthesia was induced.  The patient was placed in the dorsal lithotomy position, prepped and draped in the usual sterile fashion, and preoperative antibiotics were administered. A preoperative time-out was performed.   Cystourethroscopy was performed.  The patient's urethra was examined and was normal. The bladder was then systematically examined in its entirety. There was no evidence for any bladder tumors, stones, or other mucosal pathology.    Attention then turned to the rightureteral orifice and a ureteral catheter was used to intubate the ureteral orifice.  Omnipaque contrast was injected through the ureteral catheter and a retrograde pyelogram was performed  with findings as dictated above.  A 0.38 sensor guidewire was then advanced up the right ureter into the renal pelvis under fluoroscopic guidance.  The wire was then backloaded through the cystoscope and a ureteral stent was advance over the wire using Seldinger technique.  The stent was positioned appropriately under fluoroscopic and cystoscopic guidance.  The wire was then removed with an adequate stent curl noted in the renal pelvis as well as in the bladder.  The bladder was then emptied and the procedure ended.  The patient appeared to tolerate the procedure well and without complications.  The patient was able to be awakened and transferred to the recovery unit in satisfactory condition.    Ardis Hughs, M.D.

## 2021-10-07 NOTE — Transfer of Care (Signed)
Immediate Anesthesia Transfer of Care Note  Patient: [name]  Procedure(s) Performed: CYSTOSCOPY WITH RETROGRADE PYELOGRAM/URETERAL STENT PLACEMENT (Right: Ureter)  Patient Location: PACU  Anesthesia Type:General  Level of Consciousness: awake, alert , oriented and patient cooperative  Airway & Oxygen Therapy: Patient Spontanous Breathing and Patient connected to face mask oxygen  Post-op Assessment: Report given to RN and Post -op Vital signs reviewed and stable  Post vital signs: Reviewed and stable  Last Vitals:  Vitals Value Taken Time  BP 92/56 10/07/21 1218  Temp    Pulse 118 10/07/21 1220  Resp 25 10/07/21 1220  SpO2 95 % 10/07/21 1220  Vitals shown include unvalidated device data.  Last Pain:  Vitals:   10/07/21 0945  TempSrc:   PainSc: 5          Complications: No notable events documented.

## 2021-10-07 NOTE — H&P (Addendum)
History and Physical    Patient: [name] HFW:[id number] DOB: Aug 29, 1965 DOA: 10/07/2021 DOS: the patient was seen and examined on 10/07/2021 PCP: [facility]  Patient coming from: Home  Chief Complaint:  Chief Complaint  Patient presents with   Flank Pain   Fever   HPI: [name] is a 56 y.o. female with medical history significant of ADHD, anxiety, depression, insomnia, osteoarthritis, C. difficile diarrhea, chronic back pain, type II DM, diverticulosis, GERD, hypertension, palpitations, psoriasis, sleep apnea, tubular adenoma, umbilical hernia, history of nephrolithiasis, history of multiple cystoscopies, history UTIs who underwent an urinary stent placement on the right on 10/03/2021 followed by persistent pain in the area with some hematuria in the urine who came into the hospital after developing worsening pain of the right flank for which she was instructed to remove the stent by the [facility] urologist.  She continued to have pain despite taking acetaminophen, Percocet, naproxen, tamsulosin, Ditropan him to leave the around 0500 this morning and high fever associated with chills, myalgia, hydrologist and malaise despite taking acetaminophen.  Her appetite has been decreased, she has also had cold with occasional productive cough, but no rhinorrhea, sore throat, hemoptysis or wheezing. No chest pain, palpitations, diaphoresis, PND, orthopnea or pitting edema of the lower extremities.  She gets frequent diarrhea, then I constipation, melena or hematochezia.  Has not checked blood glucose in the past few days, but has not been eating much.  No polyuria, polydipsia or polyphagia.  ED course: Initial vital signs were temperature 103.3 F, pulse 157, respiration 18, BP 130/69 mmHg O2 sat 98% on room air.  The patient was given 2000 mL of of LR bolus, ondansetron 4 mg IVP, hydromorphone 0.5 mg IVP, ketorolac 50 mg IVP and 1 g of ceftriaxone IVPB.  Lab work: Urinalysis chemical  examination was not seen due to use of Pyridium.  Microscopic examination revealed more than 50 RBC, more than 50 WBC and many bacteria.  CBC showed a white count of 6.3, hemoglobin 12.7 g/dL platelets 166.  PT was 13.2 and INR 1.0.  Initial lactic acid was 4.0 mmol/L.  CMP showed a glucose of 164 and creatinine 1.02 mg/dL with a GFR more than 60 mL/min.  The rest of the CMP values were normal.  Imaging: CT renal stone showed progressed right side obstructive uropathy from earlier this month.  There is probably forniceal rupture now.  There is evidence of 2 punctate distal ureteral calculi both located in the pelvis.  Otherwise stable noncontrast abdomen and pelvis.  A 2 view chest radiograph did not show any acute cardiopulmonary abnormalities.   Review of Systems: As mentioned in the history of present illness. All other systems reviewed and are negative.  Past Medical History:  Diagnosis Date   ADHD (attention deficit hyperactivity disorder)    ADHD (attention deficit hyperactivity disorder)    Arthritis    C. difficile diarrhea    Chronic back pain    Depression    Diabetes mellitus without complication ([place])    Difficulty sleeping    Diverticulosis    GERD (gastroesophageal reflux disease)    Hypertension    Nephrolithiasis    Palpitations    PONV (postoperative nausea and vomiting)    Psoriasis    Right ureteral stone 09/14/2012   Sleep apnea    Tubular adenoma    colonoscopy 0/2774   Umbilical hernia    UTI (lower urinary tract infection)    Past Surgical History:  Procedure Laterality  Date   ANTERIOR CRUCIATE LIGAMENT REPAIR     ANTERIOR CRUCIATE LIGAMENT REPAIR     CHOLECYSTECTOMY     COLONOSCOPY  03/2013   CYSTOSCOPY WITH RETROGRADE PYELOGRAM, URETEROSCOPY AND STENT PLACEMENT     CYSTOSCOPY WITH RETROGRADE PYELOGRAM, URETEROSCOPY AND STENT PLACEMENT     CYSTOSCOPY WITH RETROGRADE PYELOGRAM, URETEROSCOPY AND STENT PLACEMENT     CYSTOSCOPY WITH URETEROSCOPY N/A 09/14/2012    Procedure: CYSTOSCOPY WITH URETEROSCOPY, with  stent placement;  Surgeon: Malka So, MD;  Location: [facility];  Service: Urology;  Laterality: N/A;   CYSTOSCOPY/RETROGRADE/URETEROSCOPY/STONE EXTRACTION WITH BASKET Left 11/10/2013   Procedure: LEFT URETEROSCOPY, CYSTOSCOPY;  Surgeon: Malka So, MD;  Location: [facility];  Service: Urology;  Laterality: Left;   PARTIAL COLECTOMY     TOTAL KNEE ARTHROPLASTY Left 12/20/2020   Procedure: TOTAL KNEE ARTHROPLASTY;  Surgeon: Paralee Cancel, MD;  Location: [facility];  Service: Orthopedics;  Laterality: Left;   WRIST SURGERY     Social History:  reports that she has never smoked. She has never used smokeless tobacco. She reports that she does not currently use alcohol. She reports that she does not use drugs.  Allergies  Allergen Reactions   Sulfa Antibiotics Hives and Swelling    Face swelling   Amoxicillin-Pot Clavulanate Diarrhea    Other reaction(s): Myalgias (intolerance), Other (See Comments)    Ciprofloxacin Other (See Comments)    Joint inflammation   Donepezil     Other reaction(s): Arthralgias (intolerance)   Flagyl [Metronidazole] Other (See Comments)    Altered mental state   Ivp Dye [Iodinated Contrast Media] Hives    Specifically for ct scans, tolerates mri dye   Other Itching    Narcotic pain medicines cause itching but can be managed with benadryl   Metoclopramide Palpitations    Family History  Problem Relation Age of Onset   Osteoarthritis Mother    Arthritis Mother    Asthma Mother    Osteoarthritis Maternal Grandmother    Osteoarthritis Maternal Grandfather    ADD / ADHD Son    ADD / ADHD Daughter    Colon cancer Neg Hx     Prior to Admission medications   Medication Sig Start Date End Date Taking? Authorizing Provider  acetaminophen (TYLENOL) 650 MG CR tablet Take 1,300 mg by mouth 2 (two) times daily.    [provider]  buPROPion (WELLBUTRIN XL) 300 MG 24 hr tablet Take 300 mg by mouth daily. 04/06/16    [provider]  busPIRone (BUSPAR) 15 MG tablet Take 15 mg by mouth in the morning and at bedtime. 02/21/17   [provider]  conjugated estrogens (PREMARIN) vaginal cream Place 1 applicator vaginally 2 (two) times a week.    [provider]  cyclobenzaprine (FLEXERIL) 10 MG tablet Take 1 tablet (10 mg total) by mouth 3 (three) times daily as needed for muscle spasms. 08/24/21   Gareth Morgan, MD  docusate sodium (COLACE) 100 MG capsule Take 1 capsule (100 mg total) by mouth 2 (two) times daily. 12/21/20   Irving Copas, PA-C  hydrocortisone 2.5 % cream Apply 1 application topically 2 (two) times daily as needed (irritation).    [provider]  Melatonin 10 MG CAPS Take 10 mg by mouth at bedtime as needed (sleep).    [provider]  metFORMIN (GLUCOPHAGE) 500 MG tablet Take 500 mg by mouth 2 (two) times daily. 04/14/16   [provider]  methocarbamol (ROBAXIN) 500 MG tablet Take  1 tablet (500 mg total) by mouth every 6 (six) hours as needed for muscle spasms. 12/21/20   Irving Copas, PA-C  methylphenidate 27 MG PO CR tablet Take 27 mg by mouth daily.    [provider]  metoprolol tartrate (LOPRESSOR) 50 MG tablet Take 75 mg by mouth in the morning and at bedtime. 02/21/17   [provider]  naproxen (NAPROSYN) 375 MG tablet Take 1 tablet (375 mg total) by mouth 2 (two) times daily. 09/26/21   Dorie Rank, MD  nortriptyline (PAMELOR) 50 MG capsule Take 50 mg by mouth at bedtime.    [provider]  Omega 3 1200 MG CAPS Take 1,200 mg by mouth 2 (two) times daily.    [provider]  omeprazole (PRILOSEC) 40 MG capsule Take 40 mg by mouth 2 (two) times daily.    [provider]  ondansetron (ZOFRAN) 4 MG tablet Take 1 tablet (4 mg total) by mouth every 6 (six) hours as needed for nausea. 12/21/20   Irving Copas, PA-C  ondansetron (ZOFRAN-ODT) 8 MG disintegrating tablet Take 1 tablet (8 mg  total) by mouth every 8 (eight) hours as needed for nausea or vomiting. 09/26/21   Dorie Rank, MD  oxyCODONE (ROXICODONE) 5 MG immediate release tablet Take 1 tablet (5 mg total) by mouth every 4 (four) hours as needed for severe pain. 09/13/21   Montine Circle, PA-C  oxyCODONE-acetaminophen (PERCOCET/ROXICET) 5-325 MG tablet Take 1 tablet by mouth every 6 (six) hours as needed for severe pain. 09/26/21   Dorie Rank, MD  polyethylene glycol (MIRALAX / GLYCOLAX) 17 g packet Take 17 g by mouth daily as needed for mild constipation. 12/21/20   Irving Copas, PA-C  vortioxetine HBr (TRINTELLIX) 20 MG TABS tablet Take 20 mg by mouth daily. 09/03/17   [provider]    Physical Exam: Vitals:   10/07/21 0751 10/07/21 0820 10/07/21 0850  BP: 130/69 (!) 134/94   Pulse: (!) 157 (!) 140   Resp: 18 (!) 21   Temp: (!) 103.3 F (39.6 C) (!) 104.2 F (40.1 C)   TempSrc: Oral Rectal   SpO2: 98% 97%   Weight:   90.7 kg   Physical Exam Vitals and nursing note reviewed.  Constitutional:      General: She is awake.     Appearance: She is obese. She is ill-appearing.  HENT:     Head: Normocephalic.     Mouth/Throat:     Mouth: Mucous membranes are dry.  Eyes:     General: No scleral icterus.    Pupils: Pupils are equal, round, and reactive to light.  Neck:     Vascular: No JVD.  Cardiovascular:     Rate and Rhythm: Normal rate and regular rhythm.     Heart sounds: S1 normal and S2 normal.  Pulmonary:     Effort: Pulmonary effort is normal.     Breath sounds: Normal breath sounds. No wheezing, rhonchi or rales.  Abdominal:     General: Bowel sounds are normal.     Palpations: Abdomen is soft.     Tenderness: There is abdominal tenderness in the suprapubic area. There is right CVA tenderness. There is no guarding or rebound.  Musculoskeletal:     Cervical back: Neck supple.     Right lower leg: No edema.     Left lower leg: No edema.  Skin:    General: Skin is warm and dry.   Neurological:  General: No focal deficit present.     Mental Status: She is alert and oriented to person, place, and time.  Psychiatric:        Mood and Affect: Mood normal.        Behavior: Behavior normal. Behavior is cooperative.   Data Reviewed:  There are no new results to review at this time.  Assessment and Plan: Principal Problem:   Sepsis secondary to UTI POA ([place]) Secondary to:   Right ureteral stone   And:   Hydronephrosis, right Admit to PCU/inpatient. Continue IV fluids. Continue cefepime 2 g every 8 hours.   Follow urine culture and sensitivity. Follow-up blood culture and sensitivity Follow CBC and CMP in a.m. We will have cystoscopy later today.  Active Problems:   GERD (gastroesophageal reflux disease) Resume PPI once cleared for oral intake.    Diabetes mellitus type 2, noninsulin dependent ([place]) Currently NPO. Will hold metformin due to lactic acidosis.    Hypertension Continue metoprolol 50 mg p.o. twice daily. Monitor blood pressure and heart rate.      Palpitations No palpitations at the moment. Resume beta-blocker once on diet.    Bulging lumbar disc Analgesics and muscle relaxant as needed.    Anxiety with somatization Resume buspirone and vortioxetine once on diet.    ADHD Resume methylphenidate after procedure. Follow-up with PCP and behavioral health as an outpatient.    Advance Care Planning:   Code Status: Full Code   Consults: Urology (Dr. Louis Meckel).  Family Communication: Her spouse was at bedside.  Severity of Illness: The appropriate patient status for this patient is INPATIENT. Inpatient status is judged to be reasonable and necessary in order to provide the required intensity of service to ensure the patient's safety. The patient's presenting symptoms, physical exam findings, and initial radiographic and laboratory data in the context of their chronic comorbidities is felt to place them at high risk for further  clinical deterioration. Furthermore, it is not anticipated that the patient will be medically stable for discharge from the hospital within 2 midnights of admission.   * I certify that at the point of admission it is my clinical judgment that the patient will require inpatient hospital care spanning beyond 2 midnights from the point of admission due to high intensity of service, high risk for further deterioration and high frequency of surveillance required.*  Author: Reubin Milan, MD 10/07/2021 10:20 AM  For on call review [email or web address].   This document was prepared using Dragon voice recognition software and may contain some unintended transcription errors.

## 2021-10-07 NOTE — Anesthesia Procedure Notes (Signed)
Procedure Name: LMA Insertion Date/Time: 10/07/2021 11:56 AM  Performed by: Cleda Daub, CRNAPre-anesthesia Checklist: Patient identified, Emergency Drugs available, Suction available and Patient being monitored Patient Re-evaluated:Patient Re-evaluated prior to induction Oxygen Delivery Method: Circle system utilized Preoxygenation: Pre-oxygenation with 100% oxygen Induction Type: IV induction LMA: LMA inserted LMA Size: 4.0 Number of attempts: 1 Placement Confirmation: positive ETCO2 and breath sounds checked- equal and bilateral Tube secured with: Tape Dental Injury: Teeth and Oropharynx as per pre-operative assessment

## 2021-10-07 NOTE — Anesthesia Postprocedure Evaluation (Signed)
Anesthesia Post Note  Patient: [name]  Procedure(s) Performed: CYSTOSCOPY WITH RETROGRADE PYELOGRAM/URETERAL STENT PLACEMENT (Right: Ureter)     Patient location during evaluation: PACU Anesthesia Type: General Level of consciousness: awake and alert Pain management: pain level controlled Vital Signs Assessment: post-procedure vital signs reviewed and stable Respiratory status: spontaneous breathing, nonlabored ventilation and respiratory function stable Cardiovascular status: blood pressure returned to baseline and stable Postop Assessment: no apparent nausea or vomiting Anesthetic complications: no   No notable events documented.  Last Vitals:  Vitals:   10/07/21 1251 10/07/21 1300  BP: (!) 90/57 99/61  Pulse: (!) 106 (!) 107  Resp: 18 (!) 23  Temp: 37.2 C   SpO2: 95% 96%    Last Pain:  Vitals:   10/07/21 1300  TempSrc:   PainSc: 0-No pain                 Lidia Collum

## 2021-10-07 NOTE — Progress Notes (Signed)
Pharmacy Antibiotic Note  [name] is a 56 y.o. female admitted on 10/07/2021 with UTI.  Pharmacy has been consulted for cefepime dosing.  Plan: Cefepime 2 gm IV q8h    Temp (24hrs), Avg:103.8 F (39.9 C), Min:103.3 F (39.6 C), Max:104.2 F (40.1 C)  Recent Labs  Lab 10/07/21 0758  WBC 6.3  CREATININE 1.02*  LATICACIDVEN 4.0*    CrCl cannot be calculated (Unknown ideal weight.).    Allergies  Allergen Reactions   Sulfa Antibiotics Hives and Swelling    Face swelling   Amoxicillin-Pot Clavulanate Diarrhea    Other reaction(s): Myalgias (intolerance), Other (See Comments)    Ciprofloxacin Other (See Comments)    Joint inflammation   Donepezil     Other reaction(s): Arthralgias (intolerance)   Flagyl [Metronidazole] Other (See Comments)    Altered mental state   Ivp Dye [Iodinated Contrast Media] Hives    Specifically for ct scans, tolerates mri dye   Other Itching    Narcotic pain medicines cause itching but can be managed with benadryl   Metoclopramide Palpitations    Antimicrobials this admission: 7/29 CTX x 1  7/29 Cefepime>> Dose adjustments this admission:  Microbiology results: 7/29 HIV: 7/29 UCx: ip 7/29 BCx   Thank you for allowing pharmacy to be a part of this patient's care.  Eudelia Bunch, Pharm.D 10/07/2021 9:50 AM

## 2021-10-07 NOTE — ED Triage Notes (Signed)
Pt recently had a stent placed and removed it today. Pt now has flank pain, nausea, and fever over 103. Pt is tachy in 150s in triage.

## 2021-10-07 NOTE — ED Provider Notes (Signed)
[place] DEPT Provider Note   CSN: [id number] Arrival date & time: 10/07/21  0744     History  Chief Complaint  Patient presents with   Flank Pain   Fever    [name] is a 56 y.o. female.  Patient with history of hemicolectomy, ureteral stones --presents to the emergency department with fever and right flank pain.  Patient has recently been dealing with a kidney stone on the right side.  Most recent imaging in our ER 09/26/2021 showed 2 mm mid ureteral stone.  She followed up with her urologist at the [facility] and she had a stent placed on 10/03/2021.  She also believes that she had the stone broken up with the laser.  She had worsening right-sided flank pain overnight last night.  She was instructed to pull the ureteral stent today and did so at 5 AM.  She continued to have pain and shortly afterwards developed fevers to 103 F.  She has significant right flank pain.  Right back pain that she had with stone earlier has improved.  She took Tylenol, Percocet, naproxen, tamsulosin, Ditropan, and Pyridium all around 5 AM today without improvement.  She has had a cough from a "recent cold".  No ear pain, runny nose, sore throat.  No nausea or vomiting.  She had dysuria prior to removing the stent.       Home Medications Prior to Admission medications   Medication Sig Start Date End Date Taking? Authorizing Provider  acetaminophen (TYLENOL) 650 MG CR tablet Take 1,300 mg by mouth 2 (two) times daily.    [provider]  buPROPion (WELLBUTRIN XL) 300 MG 24 hr tablet Take 300 mg by mouth daily. 04/06/16   [provider]  busPIRone (BUSPAR) 15 MG tablet Take 15 mg by mouth in the morning and at bedtime. 02/21/17   [provider]  conjugated estrogens (PREMARIN) vaginal cream Place 1 applicator vaginally 2 (two) times a week.    [provider]  cyclobenzaprine (FLEXERIL) 10 MG tablet Take 1 tablet (10 mg total) by mouth 3  (three) times daily as needed for muscle spasms. 08/24/21   Gareth Morgan, MD  docusate sodium (COLACE) 100 MG capsule Take 1 capsule (100 mg total) by mouth 2 (two) times daily. 12/21/20   Irving Copas, PA-C  hydrocortisone 2.5 % cream Apply 1 application topically 2 (two) times daily as needed (irritation).    [provider]  Melatonin 10 MG CAPS Take 10 mg by mouth at bedtime as needed (sleep).    [provider]  metFORMIN (GLUCOPHAGE) 500 MG tablet Take 500 mg by mouth 2 (two) times daily. 04/14/16   [provider]  methocarbamol (ROBAXIN) 500 MG tablet Take 1 tablet (500 mg total) by mouth every 6 (six) hours as needed for muscle spasms. 12/21/20   Irving Copas, PA-C  methylphenidate 27 MG PO CR tablet Take 27 mg by mouth daily.    [provider]  metoprolol tartrate (LOPRESSOR) 50 MG tablet Take 75 mg by mouth in the morning and at bedtime. 02/21/17   [provider]  naproxen (NAPROSYN) 375 MG tablet Take 1 tablet (375 mg total) by mouth 2 (two) times daily. 09/26/21   Dorie Rank, MD  nortriptyline (PAMELOR) 50 MG capsule Take 50 mg by mouth at bedtime.    [provider]  Omega 3 1200 MG CAPS Take 1,200 mg by mouth 2 (two) times daily.    [provider]  omeprazole (PRILOSEC) 40 MG capsule Take 40 mg by mouth 2 (two) times daily.    [provider]  ondansetron (ZOFRAN) 4 MG tablet Take 1 tablet (4 mg total) by mouth every 6 (six) hours as needed for nausea. 12/21/20   Irving Copas, PA-C  ondansetron (ZOFRAN-ODT) 8 MG disintegrating tablet Take 1 tablet (8 mg total) by mouth every 8 (eight) hours as needed for nausea or vomiting. 09/26/21   Dorie Rank, MD  oxyCODONE (ROXICODONE) 5 MG immediate release tablet Take 1 tablet (5 mg total) by mouth every 4 (four) hours as needed for severe pain. 09/13/21   Montine Circle, PA-C  oxyCODONE-acetaminophen (PERCOCET/ROXICET) 5-325 MG tablet Take 1 tablet by mouth  every 6 (six) hours as needed for severe pain. 09/26/21   Dorie Rank, MD  polyethylene glycol (MIRALAX / GLYCOLAX) 17 g packet Take 17 g by mouth daily as needed for mild constipation. 12/21/20   Irving Copas, PA-C  vortioxetine HBr (TRINTELLIX) 20 MG TABS tablet Take 20 mg by mouth daily. 09/03/17   [provider]      Allergies    Sulfa antibiotics, Amoxicillin-pot clavulanate, Ciprofloxacin, Donepezil, Flagyl [metronidazole], Ivp dye [iodinated contrast media], Other, and Metoclopramide    Review of Systems   Review of Systems  Physical Exam Updated Vital Signs BP 130/69 (BP Location: Left Arm)   Pulse (!) 157   Temp (!) 103.3 F (39.6 C) (Oral)   Resp 18   LMP 07/13/2013   SpO2 98%  Physical Exam Vitals and nursing note reviewed.  Constitutional:      General: She is not in acute distress.    Appearance: She is well-developed. She is ill-appearing.  HENT:     Head: Normocephalic and atraumatic.     Right Ear: External ear normal.     Left Ear: External ear normal.     Nose: Nose normal.  Eyes:     Conjunctiva/sclera: Conjunctivae normal.  Cardiovascular:     Rate and Rhythm: Regular rhythm. Tachycardia present.     Heart sounds: No murmur heard. Pulmonary:     Effort: No respiratory distress.     Breath sounds: No wheezing, rhonchi or rales.  Abdominal:     Palpations: Abdomen is soft.     Tenderness: There is abdominal tenderness. There is no guarding or rebound.     Comments: Patient with right upper and right lower/flank abdominal pain with tenderness to palpation.  Musculoskeletal:     Cervical back: Normal range of motion and neck supple.     Right lower leg: No edema.     Left lower leg: No edema.  Skin:    General: Skin is warm and dry.     Findings: No rash.  Neurological:     General: No focal deficit present.     Mental Status: She is alert. Mental status is at baseline.     Motor: No weakness.  Psychiatric:        Mood and Affect: Mood  normal.     ED Results / Procedures / Treatments   Labs (all labs ordered are listed, but only abnormal results are displayed) Labs Reviewed  COMPREHENSIVE METABOLIC PANEL - Abnormal; Notable for the following components:      Result Value   Glucose, Bld 164 (*)    Creatinine, Ser 1.02 (*)    All other components within normal limits  LACTIC ACID, PLASMA - Abnormal; Notable for the following components:  Lactic Acid, Venous 4.0 (*)    All other components within normal limits  CBC WITH DIFFERENTIAL/PLATELET - Abnormal; Notable for the following components:   Lymphs Abs 0.3 (*)    All other components within normal limits  SARS CORONAVIRUS 2 BY RT PCR  CULTURE, BLOOD (ROUTINE X 2)  CULTURE, BLOOD (ROUTINE X 2)  PROTIME-INR  LACTIC ACID, PLASMA  URINALYSIS, ROUTINE W REFLEX MICROSCOPIC  I-STAT BETA HCG BLOOD, ED ([facility], [facility], [facility] ONLY)    EKG None  Radiology DG Chest 2 View  Result Date: 10/07/2021 CLINICAL DATA:  56 year old female with possible sepsis. EXAM: CHEST - 2 VIEW COMPARISON:  CT Abdomen and Pelvis today. Chest radiographs 03/06/2020. FINDINGS: Semi upright AP and lateral views of the chest at 0834 hours. Normal lung volumes and mediastinal contours. Visualized tracheal air column is within normal limits. Both lungs appear stable and clear. No pneumothorax or pleural effusion. No acute osseous abnormality identified. Negative visible bowel gas. IMPRESSION: Negative.  No acute cardiopulmonary abnormality. Electronically Signed   By: Genevie Ann M.D.   On: 10/07/2021 08:58   CT Renal Stone Study  Result Date: 10/07/2021 CLINICAL DATA:  56 year old female with flank pain and recent right ureteral stone. EXAM: CT ABDOMEN AND PELVIS WITHOUT CONTRAST TECHNIQUE: Multidetector CT imaging of the abdomen and pelvis was performed following the standard protocol without IV contrast. RADIATION DOSE REDUCTION: This exam was performed according to the departmental dose-optimization program  which includes automated exposure control, adjustment of the mA and/or kV according to patient size and/or use of iterative reconstruction technique. COMPARISON:  09/26/2021 CT Abdomen and Pelvis and earlier. FINDINGS: Lower chest: Stable, negative. Hepatobiliary: Stable, absent gallbladder. Pancreas: Negative. Spleen: Negative. 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 Normal right adrenal gland. Left kidney is stable and nonobstructed. Progressed right hydronephrosis and pararenal inflammation since 09/26/2021. Progressed net flow Meg special et progressed nephromegaly. Progressed right periureteral inflammation and right hydroureter continuing into the pelvis where punctate ureteral calculus is now visible just distal to the pelvic inlet on series 2, image 70. This is approximately 5 cm proximal to the ureterovesical junction. And there could be a 2nd punctate distal ureteral calculus near the UVJ on coronal image 114. This is not delineated on the axial images. Occasional pelvic phleboliths otherwise. Stomach/Bowel: Sigmoid anastomosis redemonstrated. Diverticulosis of the descending colon. Normal retrocecal appendix. No dilated small bowel. Mild fluid distended stomach. No free air or free fluid. Vascular/Lymphatic: Normal caliber abdominal aorta. Aortoiliac calcified atherosclerosis. No lymphadenopathy identified. Reproductive: Negative noncontrast appearance. Other: No pelvic free fluid. Musculoskeletal: Lumbar facet arthropathy. No acute osseous abnormality identified. IMPRESSION: 1. Progressed Right Side Obstructive Uropathy from earlier this month. Probable forniceal rupture now. Evidence of two punctate distal ureteral calculi now, both located in the pelvis (coronal images 108 and 114). 2. Otherwise stable noncontrast abdomen and pelvis. Electronically Signed   By: Genevie Ann M.D.   On:  10/07/2021 08:57    Procedures Procedures    Medications Ordered in ED Medications  sodium chloride 0.9 % bolus 500 mL (has no administration in time range)  HYDROmorphone (DILAUDID) injection 1 mg (has no administration in time range)  lactated ringers bolus 1,000 mL (has no administration in time range)  ceFEPIme (MAXIPIME) 2 g in sodium chloride 0.9 % 100 mL IVPB (has no administration in time range)  lactated ringers infusion (has no administration in time range)  acetaminophen (TYLENOL) tablet 650 mg (  has no administration in time range)    Or  acetaminophen (TYLENOL) suppository 650 mg (has no administration in time range)  ondansetron (ZOFRAN) tablet 4 mg (has no administration in time range)    Or  ondansetron (ZOFRAN) injection 4 mg (has no administration in time range)  oxyCODONE (Oxy IR/ROXICODONE) immediate release tablet 5 mg (has no administration in time range)  lactated ringers bolus 2,000 mL (2,000 mLs Intravenous New Bag/Given 10/07/21 5638)  HYDROmorphone (DILAUDID) injection 0.5 mg (0.5 mg Intravenous Given 10/07/21 0846)  ondansetron (ZOFRAN) injection 4 mg (4 mg Intravenous Given 10/07/21 0843)  cefTRIAXone (ROCEPHIN) 1 g in sodium chloride 0.9 % 100 mL IVPB (0 g Intravenous Stopped 10/07/21 0920)  ketorolac (TORADOL) 15 MG/ML injection 15 mg (15 mg Intravenous Given 10/07/21 0848)    ED Course/ Medical Decision Making/ A&P Clinical Course as of 10/07/21 0852  Sat Oct 07, 2021  0842 09/26/21 CT showing 0.2 mm stone in proximal right ureter [MT]  9373 This is a 56 year old female presented ED with complaint of abdominal pain, nausea, worsening after removing the ureteral stent this morning around 0500, as instructed her urologist.  Stent was placed on Tuesday per her report, to assist with passage of kidney stone.  She reports since moving the stent her pain has worsened, she arrives tachycardic and febrile.  She says the pain is worse with urination.  Work-up shows  white blood cell count normal, labs otherwise pending.  She is pending sepsis work-up.  She does report a history of urosepsis.  Pending repeat CT scan at this time.  [MT]  4287 UA on 09/26/21 without clear sign of infection [MT]    Clinical Course User Index [MT] Wyvonnia Dusky, MD    Patient seen and examined. History obtained directly from patient.   Labs/EKG: Ordered sepsis work-up.  Imaging: Ordered renal protocol CT, chest x-ray.  Medications/Fluids: Ordered: IV fluids 2 L LR, IV Rocephin due to suspicion for urinary source, IV Dilaudid, IV Zofran.  Most recent vital signs reviewed and are as follows: BP 130/69 (BP Location: Left Arm)   Pulse (!) 157   Temp (!) 103.3 F (39.6 C) (Oral)   Resp 18   LMP 07/13/2013   SpO2 98%   Initial impression: Sepsis, likely urologic source, however will consider other etiologies.  8:53 AM Notified that lactate 4.0. Ideal body weight calculated to be 72kg. Total fluid bolus increased to 2500cc to meet bolus criteria for severe sepsis.  8:58 AM personally reviewed and interpreted CT images.  Patient with right-sided hydronephrosis and perinephric stranding.  Awaiting radiology report.  9:50 AM Reassessment performed. Patient appears more comfortable now after pain medication.  She has been up to the restroom.  Labs personally reviewed and interpreted including: CBC with normal white blood cell count, lymphocyte count 0.3; CMP with hyperglycemia glucose 164, creatinine minimally elevated at 1.02 otherwise unremarkable; lactate 4.0; PT/INR normal; negative COVID.  Imaging personally visualized and interpreted including: Chest x-ray, agree negative.  Reviewed pertinent lab work and imaging with patient at bedside. Questions answered.   Most current vital signs reviewed and are as follows: BP (!) 134/94   Pulse (!) 140   Temp (!) 104.2 F (40.1 C) (Rectal)   Resp (!) 21   Wt 90.7 kg   LMP 07/13/2013   SpO2 97%   BMI 33.28 kg/m    I have consulted with Dr. Louis Meckel of urology by telephone.  He has reviewed CT imaging.  Patient will need  stent replaced and he will do this later today.  Patient will remain n.p.o.  Plan: Admit to hospital.  I have consulted with Dr. Olevia Bowens of [facility] hospitalist who will see patient.  CRITICAL CARE Performed by: Carlisle Cater PA-C Total critical care time: 40 minutes Critical care time was exclusive of separately billable procedures and treating other patients. Critical care was necessary to treat or prevent imminent or life-threatening deterioration. Critical care was time spent personally by me on the following activities: development of treatment plan with patient and/or surrogate as well as nursing, discussions with consultants, evaluation of patient's response to treatment, examination of patient, obtaining history from patient or surrogate, ordering and performing treatments and interventions, ordering and review of laboratory studies, ordering and review of radiographic studies, pulse oximetry and re-evaluation of patient's condition.                            Medical Decision Making Amount and/or Complexity of Data Reviewed Labs: ordered. Radiology: ordered.  Risk Prescription drug management.   Patient with severe sepsis in setting of obstructive uropathy due to distal stone fragments.  Question of forniceal rupture on CT.  Urology involved.  Patient will need admission to the hospital for IV antibiotics.  Currently no other obvious source of infection noted.  She is receiving IV Rocephin for urosepsis, no recent urine cultures available to help guide therapy.  Cultures pending today.        Final Clinical Impression(s) / ED Diagnoses Final diagnoses:  Severe sepsis [facility])  Right ureteral stone  Pyelonephritis    Rx / DC Orders ED Discharge Orders     None         Carlisle Cater, PA-C 10/07/21 3794    Wyvonnia Dusky, MD 10/07/21 1419

## 2021-10-07 NOTE — Discharge Instructions (Signed)
DISCHARGE INSTRUCTIONS FOR KIDNEY STONE/URETERAL STENT   MEDICATIONS:  1. Resume all your other meds from home 2. Take new medications as prescribed   ACTIVITY:  1. No strenuous activity x 1week  2. No driving while on narcotic pain medications  3. Drink plenty of water  4. Continue to walk at home - you can still get blood clots when you are at home, so keep active, but don't over do it.  5. May return to work/school tomorrow or when you feel ready   BATHING:  1. You can shower and we recommend daily showers  2. You have a string coming from your urethra: The stent string is attached to your ureteral stent. Do not pull on this.   SIGNS/SYMPTOMS TO CALL:  Please call [name] if you have a fever greater than 101.5, uncontrolled nausea/vomiting, uncontrolled pain, dizziness, unable to urinate, bloody urine, chest pain, shortness of breath, leg swelling, leg pain, redness around wound, drainage from wound, or any other concerns or questions.   You can reach [name] at [phone].   FOLLOW-UP:  1. We will schedule you for stent removal in the coming 7-10 days.   Additional Discharge Instructions   Please get your medications reviewed and adjusted by your Primary MD.  Please request your Primary MD to go over all Hospital Tests and Procedure/Radiological results at the follow up, please get all Hospital records sent to your Prim MD by signing hospital release before you go home.  If you had Pneumonia of Lung problems at the Hospital: Please get a 2 view Chest X ray done in approximately 4 weeks after hospital discharge or sooner if instructed by your Primary MD.  If you have Congestive Heart Failure: Please call your Cardiologist or Primary MD anytime you have any of the following symptoms:  1) 3 pound weight gain in 24 hours or 5 pounds in 1 week  2) shortness of breath, with or without a dry hacking cough  3) swelling in the hands, feet or stomach  4) if you have to sleep on extra  pillows at night in order to breathe  Follow cardiac low salt diet and 1.5 lit/day fluid restriction.  If you have diabetes Accuchecks 4 times/day, Once in AM empty stomach and then before each meal. Log in all results and show them to your primary doctor at your next visit. If any glucose reading is under 80 or above 300 call your primary MD immediately.  If you have Seizure/Convulsions/Epilepsy: Please do not drive, operate heavy machinery, participate in activities at heights or participate in high speed sports until you have seen by Primary MD or a Neurologist and advised to do so again. Per [facility] statutes, patients with seizures are not allowed to drive until they have been seizure-free for six months.  Use caution when using heavy equipment or power tools. Avoid working on ladders or at heights. Take showers instead of baths. Ensure the water temperature is not too high on the home water heater. Do not go swimming alone. Do not lock yourself in a room alone (i.e. bathroom). When caring for infants or small children, sit down when holding, feeding, or changing them to minimize risk of injury to the child in the event you have a seizure. Maintain good sleep hygiene. Avoid alcohol.   If you had Gastrointestinal Bleeding: Please ask your Primary MD to check a complete blood count within one week of discharge or at your next visit. Your endoscopic/colonoscopic biopsies that  are pending at the time of discharge, will also need to followed by your Primary MD.  Get Medicines reviewed and adjusted. Please take all your medications with you for your next visit with your Primary MD  Please request your Primary MD to go over all hospital tests and procedure/radiological results at the follow up, please ask your Primary MD to get all Hospital records sent to his/her office.  If you experience worsening of your admission symptoms, develop shortness of breath, life threatening emergency,  suicidal or homicidal thoughts you must seek medical attention immediately by calling 911 or calling your MD immediately  if symptoms less severe.  You must read complete instructions/literature along with all the possible adverse reactions/side effects for all the Medicines you take and that have been prescribed to you. Take any new Medicines after you have completely understood and accpet all the possible adverse reactions/side effects.   Do not drive or operate heavy machinery when taking Pain medications.   Do not take more than prescribed Pain, Sleep and Anxiety Medications  Special Instructions: If you have smoked or chewed Tobacco  in the last 2 yrs please stop smoking, stop any regular Alcohol  and or any Recreational drug use.  Wear Seat belts while driving.  Please note You were cared for by a hospitalist during your hospital stay. If you have any questions about your discharge medications or the care you received while you were in the hospital after you are discharged, you can call the unit and asked to speak with the hospitalist on call if the hospitalist that took care of you is not available. Once you are discharged, your primary care physician will handle any further medical issues. Please note that NO REFILLS for any discharge medications will be authorized once you are discharged, as it is imperative that you return to your primary care physician (or establish a relationship with a primary care physician if you do not have one) for your aftercare needs so that they can reassess your need for medications and monitor your lab values.  You can reach the hospitalist office at phone [phone] or fax [phone]   If you do not have a primary care physician, you can call [phone] for a physician referral.

## 2021-10-08 ENCOUNTER — Encounter (HOSPITAL_COMMUNITY): Payer: Self-pay | Admitting: Urology

## 2021-10-08 DIAGNOSIS — F901 Attention-deficit hyperactivity disorder, predominantly hyperactive type: Secondary | ICD-10-CM | POA: Diagnosis not present

## 2021-10-08 DIAGNOSIS — N12 Tubulo-interstitial nephritis, not specified as acute or chronic: Secondary | ICD-10-CM

## 2021-10-08 DIAGNOSIS — K219 Gastro-esophageal reflux disease without esophagitis: Secondary | ICD-10-CM | POA: Diagnosis not present

## 2021-10-08 DIAGNOSIS — A419 Sepsis, unspecified organism: Secondary | ICD-10-CM | POA: Diagnosis not present

## 2021-10-08 DIAGNOSIS — N201 Calculus of ureter: Secondary | ICD-10-CM

## 2021-10-08 LAB — CBC WITH DIFFERENTIAL/PLATELET
Abs Immature Granulocytes: 0.03 10*3/uL (ref 0.00–0.07)
Basophils Absolute: 0 10*3/uL (ref 0.0–0.1)
Basophils Relative: 0 %
Eosinophils Absolute: 0 10*3/uL (ref 0.0–0.5)
Eosinophils Relative: 0 %
HCT: 31.6 % — ABNORMAL LOW (ref 36.0–46.0)
Hemoglobin: 10.5 g/dL — ABNORMAL LOW (ref 12.0–15.0)
Immature Granulocytes: 0 %
Lymphocytes Relative: 4 %
Lymphs Abs: 0.3 10*3/uL — ABNORMAL LOW (ref 0.7–4.0)
MCH: 30.4 pg (ref 26.0–34.0)
MCHC: 33.2 g/dL (ref 30.0–36.0)
MCV: 91.6 fL (ref 80.0–100.0)
Monocytes Absolute: 0.4 10*3/uL (ref 0.1–1.0)
Monocytes Relative: 5 %
Neutro Abs: 6.8 10*3/uL (ref 1.7–7.7)
Neutrophils Relative %: 91 %
Platelets: 113 10*3/uL — ABNORMAL LOW (ref 150–400)
RBC: 3.45 MIL/uL — ABNORMAL LOW (ref 3.87–5.11)
RDW: 13.5 % (ref 11.5–15.5)
WBC: 7.5 10*3/uL (ref 4.0–10.5)
nRBC: 0 % (ref 0.0–0.2)

## 2021-10-08 LAB — BASIC METABOLIC PANEL
Anion gap: 7 (ref 5–15)
BUN: 5 mg/dL — ABNORMAL LOW (ref 6–20)
CO2: 25 mmol/L (ref 22–32)
Calcium: 9.2 mg/dL (ref 8.9–10.3)
Chloride: 108 mmol/L (ref 98–111)
Creatinine, Ser: 0.84 mg/dL (ref 0.44–1.00)
GFR, Estimated: >60 mL/min (ref >60)
Glucose, Bld: 175 mg/dL — ABNORMAL HIGH (ref 70–99)
Potassium: 4.6 mmol/L (ref 3.5–5.1)
Sodium: 140 mmol/L (ref 135–145)

## 2021-10-08 LAB — GLUCOSE, CAPILLARY
Glucose-Capillary: 152 mg/dL — ABNORMAL HIGH (ref 70–99)
Glucose-Capillary: 174 mg/dL — ABNORMAL HIGH (ref 70–99)
Glucose-Capillary: 167 mg/dL — ABNORMAL HIGH (ref 70–99)
Glucose-Capillary: 145 mg/dL — ABNORMAL HIGH (ref 70–99)

## 2021-10-08 LAB — HIV ANTIBODY (ROUTINE TESTING W REFLEX): HIV Screen 4th Generation wRfx: NONREACTIVE

## 2021-10-08 LAB — HEMOGLOBIN A1C
Hgb A1c MFr Bld: 6.4 % — ABNORMAL HIGH (ref 4.8–5.6)
Mean Plasma Glucose: 136.98 mg/dL

## 2021-10-08 MED ORDER — DIPHENHYDRAMINE HCL 50 MG/ML IJ SOLN
25.0000 mg | Freq: Four times a day (QID) | INTRAMUSCULAR | Status: DC | PRN
Start: 2021-10-08 — End: 2021-10-10
  Administered 2021-10-08 – 2021-10-10 (×6): 25 mg via INTRAVENOUS
  Filled 2021-10-08 (×7): qty 1

## 2021-10-08 MED ORDER — AMPHETAMINE-DEXTROAMPHET ER 10 MG PO CP24
20.0000 mg | ORAL_CAPSULE | Freq: Every morning | ORAL | Status: DC
  Administered 2021-10-09: 20 mg via ORAL
  Filled 2021-10-08 (×2): qty 2

## 2021-10-08 MED ORDER — METOPROLOL TARTRATE 50 MG PO TABS
150.0000 mg | ORAL_TABLET | Freq: Every morning | ORAL | Status: DC
  Administered 2021-10-08: 150 mg via ORAL
  Filled 2021-10-08: qty 3

## 2021-10-08 MED ORDER — BUPROPION HCL ER (XL) 300 MG PO TB24
300.0000 mg | ORAL_TABLET | Freq: Every morning | ORAL | Status: DC
  Administered 2021-10-08 – 2021-10-10 (×3): 300 mg via ORAL
  Filled 2021-10-08 (×3): qty 1

## 2021-10-08 MED ORDER — TAMSULOSIN HCL 0.4 MG PO CAPS
0.4000 mg | ORAL_CAPSULE | Freq: Every morning | ORAL | Status: DC
  Administered 2021-10-08 – 2021-10-10 (×3): 0.4 mg via ORAL
  Filled 2021-10-08 (×3): qty 1

## 2021-10-08 MED ORDER — OXYBUTYNIN CHLORIDE 5 MG PO TABS
5.0000 mg | ORAL_TABLET | Freq: Three times a day (TID) | ORAL | Status: DC | PRN
  Administered 2021-10-09 – 2021-10-10 (×2): 5 mg via ORAL
  Filled 2021-10-08 (×2): qty 1

## 2021-10-08 MED ORDER — FLUCONAZOLE 150 MG PO TABS
150.0000 mg | ORAL_TABLET | Freq: Once | ORAL | Status: AC
  Administered 2021-10-08: 150 mg via ORAL
  Filled 2021-10-08: qty 1

## 2021-10-08 MED ORDER — METOPROLOL TARTRATE 50 MG PO TABS
75.0000 mg | ORAL_TABLET | Freq: Two times a day (BID) | ORAL | Status: DC
  Administered 2021-10-08 – 2021-10-10 (×4): 75 mg via ORAL
  Filled 2021-10-08 (×4): qty 1

## 2021-10-08 MED ORDER — VORTIOXETINE HBR 5 MG PO TABS
20.0000 mg | ORAL_TABLET | Freq: Every morning | ORAL | Status: DC
  Administered 2021-10-08 – 2021-10-10 (×3): 20 mg via ORAL
  Filled 2021-10-08 (×3): qty 4

## 2021-10-08 MED ORDER — DIPHENHYDRAMINE HCL 50 MG/ML IJ SOLN
12.5000 mg | Freq: Four times a day (QID) | INTRAMUSCULAR | Status: DC | PRN
  Administered 2021-10-08: 12.5 mg via INTRAVENOUS
  Filled 2021-10-08: qty 1

## 2021-10-08 NOTE — Progress Notes (Addendum)
PROGRESS NOTE   [name]  LTJ:[id number]    DOB: 09/21/1965    DOA: 10/07/2021  PCP: [facility]   I have briefly reviewed patients previous medical records in [facility].  Chief Complaint  Patient presents with   Flank Pain   Fever    Brief Narrative:  56 year old female with extensive PMH including ADHD, anxiety, depression, insomnia, type II DM, GERD, HTN, OSA, nephrolithiasis, multiple cystoscopies, UTIs, s/p right ureteroscopy at the [place] on 7/26, presented to the ED on 7/29 after removing her stent 1 day early secondary to severe pain and subsequent fever up to 104 F, tachycardia and poorly controlled pain.  She had undergone ureteroscopy for a right mid ureteral stone.  In the ED met sepsis criteria with fever of 103.3 F, pulse rate of 157, urine microscopy suggestive of UTI and CT abdomen demonstrated some mild right hydroureteronephrosis with a small stone fragment in the mid ureter and some ureteral edema.  She was admitted for sepsis secondary to acute pyelonephritis, right hydronephrosis, secondary to poorly draining kidney from ureteral edema and a small stone fragment from her recent ureteroscopy, laser lithotripsy.   Assessment & Plan:  Principal Problem:   Sepsis secondary to UTI [facility]) Active Problems:   Right ureteral stone   GERD (gastroesophageal reflux disease)   Diabetes mellitus type 2, noninsulin dependent (HCC)   Anxiety with somatization   Bulging lumbar disc   Hydronephrosis, right   Attention-deficit hyperactivity disorder, predominantly hyperactive type   Hypertension   Palpitations  Sepsis: secondary to acute pyelonephritis from right ureteral obstruction.  Treated per protocol with aggressive IV fluids and started empirically on IV cefepime.  Blood cultures negative to date.  Urology was consulted and urgently taken to the OR and underwent cystoscopy and right ureteral stent placement 7/29.  Clinically improved (defervesced,  vital signs of stabilized).  It appears that urine culture was not sent off, as discussed with pharmacy, lab was going to add urine culture to prior sample.  Continue IV cefepime pending final culture results.  Multimodality pain management.  Has itching related to opioids, as needed IV Benadryl.  Minimize opioids as soon as able.  Lactic acidosis: Secondary to sepsis and treated as above.  Improved.  Anemia: Could be dilutional and related to acute illness.  Follow CBC in AM.  Thrombocytopenia: Likely due to acute infectious illness and may also be due to antibiotics.  Follow CBC closely.  GERD (gastroesophageal reflux disease) PPI.   Diabetes mellitus type 2, noninsulin dependent ([place]) Holding metformin due to acute illness and lactic acidosis. A1c 6.4. Continue SSI and reasonable inpatient control.   Hypertension As reviewed by pharmacy, it appears that patient takes metoprolol tartrate 150 Mg every morning, continue same.  Ideally this should be a split dose.  Can discuss this with her tomorrow or she can follow-up with her PCP.    Palpitations Checked with the patient who felt that she was on long-acting metoprolol 150 daily.  She reviewed her records and confirmed that she has been taking metoprolol tartrate all along for the last couple of months thinking that that was the long-acting one.  As discussed with her, changed this to metoprolol tartrate 75 Mg twice daily.  She wanted this done.  She was advised to follow-up with her PCP to either continue the same dose as was changed here or consolidate to Toprol-XL as outpatient.   Anxiety, depression, ADHD and insomnia Patient on polypharmacy and confirmed with her  that she has been on these for an extended period of time and she wants to resume all of them including Adderall, bupropion, BuSpar, nortriptyline and Vortioxetine.  Body mass index is 33.28 kg/m.    DVT prophylaxis: SCDs Start: 10/07/21 0935     Code Status: Full  Code:  Family Communication: None at bedside. Disposition:  Status is: Inpatient Remains inpatient appropriate because: IV antibiotics.     Consultants:   Urology  Procedures:   As above  Antimicrobials:   As above   Subjective:  Feels much better.  Still with right flank to groin pain.  Itching related to Dilaudid which she wishes to continue due to significant pain.  Also wants all her behavioral health medications resumed.  She also reported that she feels like vaginal thrush is coming on and asking for Diflucan.  Objective:   Vitals:   10/07/21 1717 10/07/21 2201 10/08/21 0442 10/08/21 1126  BP: 116/70 (!) 168/92 (!) 113/57 115/71  Pulse: 92 (!) 109 84 92  Resp: $Remo'17  18 20  'soOAs$ Temp: 97.7 F (36.5 C) 99.8 F (37.7 C)  99.5 F (37.5 C)  TempSrc: Oral Oral  Oral  SpO2: 97% 97% 97% 95%  Weight:        General exam: Young female, moderately built and nourished lying comfortably propped up in bed without distress.  Oral mucosa moist. Respiratory system: Clear to auscultation. Respiratory effort normal. Cardiovascular system: S1 & S2 heard, RRR. No JVD, murmurs, rubs, gallops or clicks. No pedal edema.  Telemetry personally reviewed: Sinus rhythm. Gastrointestinal system: Abdomen is nondistended, soft and nontender. No organomegaly or masses felt. Normal bowel sounds heard. Central nervous system: Alert and oriented. No focal neurological deficits. Extremities: Symmetric 5 x 5 power. Skin: No rashes, lesions or ulcers Psychiatry: Judgement and insight appear normal. Mood & affect appropriate.     Data Reviewed:   I have personally reviewed following labs and imaging studies   CBC: Recent Labs  Lab 10/07/21 0758 10/08/21 0426  WBC 6.3 7.5  NEUTROABS 5.8 6.8  HGB 12.7 10.5*  HCT 38.1 31.6*  MCV 90.1 91.6  PLT 166 113*    Basic Metabolic Panel: Recent Labs  Lab 10/07/21 0758 10/08/21 0426  NA 138 140  K 4.2 4.6  CL 102 108  CO2 22 25  GLUCOSE 164* 175*   BUN 18 5*  CREATININE 1.02* 0.84  CALCIUM 9.2 9.2    Liver Function Tests: Recent Labs  Lab 10/07/21 0758  AST 28  ALT 29  ALKPHOS 78  BILITOT 0.9  PROT 7.1  ALBUMIN 3.9    CBG: Recent Labs  Lab 10/07/21 2207 10/08/21 0738 10/08/21 1123  GLUCAP 214* 152* 174*    Microbiology Studies:   Recent Results (from the past 240 hour(s))  Culture, blood (Routine x 2)     Status: None (Preliminary result)   Collection Time: 10/07/21  7:58 AM   Specimen: BLOOD  Result Value Ref Range Status   Specimen Description   Final    BLOOD SITE NOT SPECIFIED Performed at [facility] [address] [place], [place] [address]    Special Requests   Final    BOTTLES DRAWN AEROBIC AND ANAEROBIC Blood Culture adequate volume Performed at [facility] [address], [place], [place] [address]    Culture   Final    NO GROWTH < 24 HOURS Performed at [facility] [address] [place], [place] [address]  Report Status PENDING  Incomplete  SARS Coronavirus 2 by RT PCR (hospital order, performed in [facility] hospital lab) *cepheid single result test*     Status: None   Collection Time: 10/07/21  8:20 AM   Specimen: Nasal Swab  Result Value Ref Range Status   SARS Coronavirus 2 by RT PCR NEGATIVE NEGATIVE Final    Comment: (NOTE) SARS-CoV-2 target nucleic acids are NOT DETECTED.  The SARS-CoV-2 RNA is generally detectable in upper and lower respiratory specimens during the acute phase of infection. The lowest concentration of SARS-CoV-2 viral copies this assay can detect is 250 copies / mL. A negative result does not preclude SARS-CoV-2 infection and should not be used as the sole basis for treatment or other patient management decisions.  A negative result may occur with improper specimen collection / handling, submission of specimen other than nasopharyngeal swab, presence of viral mutation(s) within the areas targeted by this  assay, and inadequate number of viral copies (<250 copies / mL). A negative result must be combined with clinical observations, patient history, and epidemiological information.  Fact Sheet for Patients:   https://www.patel.info/  Fact Sheet for Healthcare Providers: https://hall.com/  This test is not yet approved or  cleared by the [place] FDA and has been authorized for detection and/or diagnosis of SARS-CoV-2 by FDA under an Emergency Use Authorization (EUA).  This EUA will remain in effect (meaning this test can be used) for the duration of the COVID-19 declaration under Section 564(b)(1) of the Act, 21 U.S.C. section 360bbb-3(b)(1), unless the authorization is terminated or revoked sooner.  Performed at [facility], [place] [address] [place], [place] [address]     Radiology Studies:  DG C-Arm 1-60 Min-No Report  Result Date: 10/07/2021 Fluoroscopy was utilized by the requesting physician.  No radiographic interpretation.   DG Chest 2 View  Result Date: 10/07/2021 CLINICAL DATA:  56 year old female with possible sepsis. EXAM: CHEST - 2 VIEW COMPARISON:  CT Abdomen and Pelvis today. Chest radiographs 03/06/2020. FINDINGS: Semi upright AP and lateral views of the chest at 0834 hours. Normal lung volumes and mediastinal contours. Visualized tracheal air column is within normal limits. Both lungs appear stable and clear. No pneumothorax or pleural effusion. No acute osseous abnormality identified. Negative visible bowel gas. IMPRESSION: Negative.  No acute cardiopulmonary abnormality. Electronically Signed   By: Genevie Ann M.D.   On: 10/07/2021 08:58   CT Renal Stone Study  Result Date: 10/07/2021 CLINICAL DATA:  56 year old female with flank pain and recent right ureteral stone. EXAM: CT ABDOMEN AND PELVIS WITHOUT CONTRAST TECHNIQUE: Multidetector CT imaging of the abdomen and pelvis was performed following the  standard protocol without IV contrast. RADIATION DOSE REDUCTION: This exam was performed according to the departmental dose-optimization program which includes automated exposure control, adjustment of the mA and/or kV according to patient size and/or use of iterative reconstruction technique. COMPARISON:  09/26/2021 CT Abdomen and Pelvis and earlier. FINDINGS: Lower chest: Stable, negative. Hepatobiliary: Stable, absent gallbladder. Pancreas: Negative. Spleen: Negative. 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 Normal right adrenal gland. Left kidney is stable and nonobstructed. Progressed right hydronephrosis and pararenal inflammation since 09/26/2021. Progressed net flow Meg special et progressed nephromegaly. Progressed right periureteral inflammation and right hydroureter continuing into the pelvis where punctate ureteral calculus is now visible just distal to the pelvic inlet on series 2, image 70. This is approximately 5 cm proximal to the  ureterovesical junction. And there could be a 2nd punctate distal ureteral calculus near the UVJ on coronal image 114. This is not delineated on the axial images. Occasional pelvic phleboliths otherwise. Stomach/Bowel: Sigmoid anastomosis redemonstrated. Diverticulosis of the descending colon. Normal retrocecal appendix. No dilated small bowel. Mild fluid distended stomach. No free air or free fluid. Vascular/Lymphatic: Normal caliber abdominal aorta. Aortoiliac calcified atherosclerosis. No lymphadenopathy identified. Reproductive: Negative noncontrast appearance. Other: No pelvic free fluid. Musculoskeletal: Lumbar facet arthropathy. No acute osseous abnormality identified. IMPRESSION: 1. Progressed Right Side Obstructive Uropathy from earlier this month. Probable forniceal rupture now. Evidence of two punctate distal ureteral calculi now, both located in the  pelvis (coronal images 108 and 114). 2. Otherwise stable noncontrast abdomen and pelvis. Electronically Signed   By: Genevie Ann M.D.   On: 10/07/2021 08:57    Scheduled Meds:    amphetamine-dextroamphetamine  20 mg Oral q morning   buPROPion  300 mg Oral q morning   busPIRone  15 mg Oral BID   insulin aspart  0-15 Units Subcutaneous TID WC   metoprolol tartrate  150 mg Oral q morning   nortriptyline  50 mg Oral QHS   pantoprazole  40 mg Oral Daily   tamsulosin  0.4 mg Oral q morning   vortioxetine HBr  20 mg Oral q morning    Continuous Infusions:    ceFEPime (MAXIPIME) IV 2 g (10/08/21 0913)   lactated ringers     sodium chloride       LOS: 1 day     Vernell Leep, MD,  FACP, [facility], [facility], [facility] (Care Management Physician Certified) [place]  To contact the attending provider between 7A-7P or the covering provider during after hours 7P-7A, please log into the web site [email or web address] and access using universal [place] password for that web site. If you do not have the password, please call the hospital operator.  10/08/2021, 4:49 PM

## 2021-10-08 NOTE — Plan of Care (Signed)
  Problem: Education: Goal: Knowledge of General Education information will improve Description: Including pain rating scale, medication(s)/side effects and non-pharmacologic comfort measures Outcome: Progressing   Problem: Health Behavior/Discharge Planning: Goal: Ability to manage health-related needs will improve Outcome: Progressing   Problem: Clinical Measurements: Goal: Diagnostic test results will improve Outcome: Progressing   Problem: Pain Managment: Goal: General experience of comfort will improve Outcome: Progressing   Problem: Nutrition: Goal: Adequate nutrition will be maintained Outcome: Completed/Met

## 2021-10-09 DIAGNOSIS — A419 Sepsis, unspecified organism: Secondary | ICD-10-CM | POA: Diagnosis not present

## 2021-10-09 DIAGNOSIS — N12 Tubulo-interstitial nephritis, not specified as acute or chronic: Secondary | ICD-10-CM | POA: Diagnosis not present

## 2021-10-09 DIAGNOSIS — K219 Gastro-esophageal reflux disease without esophagitis: Secondary | ICD-10-CM | POA: Diagnosis not present

## 2021-10-09 DIAGNOSIS — F901 Attention-deficit hyperactivity disorder, predominantly hyperactive type: Secondary | ICD-10-CM | POA: Diagnosis not present

## 2021-10-09 LAB — GLUCOSE, CAPILLARY
Glucose-Capillary: 104 mg/dL — ABNORMAL HIGH (ref 70–99)
Glucose-Capillary: 126 mg/dL — ABNORMAL HIGH (ref 70–99)
Glucose-Capillary: 220 mg/dL — ABNORMAL HIGH (ref 70–99)
Glucose-Capillary: 149 mg/dL — ABNORMAL HIGH (ref 70–99)

## 2021-10-09 LAB — CBC
HCT: 33.5 % — ABNORMAL LOW (ref 36.0–46.0)
Hemoglobin: 10.6 g/dL — ABNORMAL LOW (ref 12.0–15.0)
MCH: 29.5 pg (ref 26.0–34.0)
MCHC: 31.6 g/dL (ref 30.0–36.0)
MCV: 93.3 fL (ref 80.0–100.0)
Platelets: 116 10*3/uL — ABNORMAL LOW (ref 150–400)
RBC: 3.59 MIL/uL — ABNORMAL LOW (ref 3.87–5.11)
RDW: 13.9 % (ref 11.5–15.5)
WBC: 5.6 10*3/uL (ref 4.0–10.5)
nRBC: 0 % (ref 0.0–0.2)

## 2021-10-09 LAB — BASIC METABOLIC PANEL
Anion gap: 7 (ref 5–15)
BUN: 17 mg/dL (ref 6–20)
CO2: 27 mmol/L (ref 22–32)
Calcium: 9.1 mg/dL (ref 8.9–10.3)
Chloride: 105 mmol/L (ref 98–111)
Creatinine, Ser: 0.88 mg/dL (ref 0.44–1.00)
GFR, Estimated: >60 mL/min (ref >60)
Glucose, Bld: 117 mg/dL — ABNORMAL HIGH (ref 70–99)
Potassium: 4.1 mmol/L (ref 3.5–5.1)
Sodium: 139 mmol/L (ref 135–145)

## 2021-10-09 LAB — URINE CULTURE

## 2021-10-09 MED ORDER — ENSURE ENLIVE PO LIQD
237.0000 mL | Freq: Two times a day (BID) | ORAL | Status: DC
  Administered 2021-10-09: 237 mL via ORAL

## 2021-10-09 MED ORDER — VALACYCLOVIR HCL 500 MG PO TABS
2000.0000 mg | ORAL_TABLET | Freq: Two times a day (BID) | ORAL | Status: AC
  Administered 2021-10-09 (×2): 2000 mg via ORAL
  Filled 2021-10-09 (×2): qty 4

## 2021-10-09 MED ORDER — OXYCODONE HCL 5 MG PO TABS
5.0000 mg | ORAL_TABLET | ORAL | Status: DC | PRN
  Administered 2021-10-09 – 2021-10-10 (×4): 10 mg via ORAL
  Filled 2021-10-09 (×4): qty 2

## 2021-10-09 MED ORDER — ALUM & MAG HYDROXIDE-SIMETH 200-200-20 MG/5ML PO SUSP
30.0000 mL | Freq: Four times a day (QID) | ORAL | Status: DC | PRN
Start: 2021-10-09 — End: 2021-10-10
  Administered 2021-10-09: 30 mL via ORAL
  Filled 2021-10-09: qty 30

## 2021-10-09 NOTE — Progress Notes (Signed)
Pharmacy Antibiotic Note  [name] is a 56 y.o. female admitted on 10/07/2021 with  Pyelo .  Pharmacy has been consulted for Cefepime dosing.  ID: UTI, urosepsis. Tmax 99.5. wBC WNL  Antimicrobials this admission: 7/29 CTX x 1  7/29 Cefepime>> Valtrex for cold sores  Microbiology results: 7/29 UCx: mult species 7/29 Bcx: NGTD  Plan: Con't Cefepime 2g IV q8hr. Dose ok for CrCl>60. Pharmacy will sign off. Please reconsult for further dosing assitance.     Weight: 90.7 kg (200 lb)  Temp (24hrs), Avg:99.5 F (37.5 C), Min:99.4 F (37.4 C), Max:99.5 F (37.5 C)  Recent Labs  Lab 10/07/21 0758 10/07/21 0958 10/07/21 1620 10/08/21 0426 10/09/21 0431  WBC 6.3  --   --  7.5 5.6  CREATININE 1.02*  --   --  0.84 0.88  LATICACIDVEN 4.0* 2.9* 2.4*  --   --     Estimated Creatinine Clearance: 80.4 mL/min (by C-G formula based on SCr of 0.88 mg/dL).    Allergies  Allergen Reactions   Sulfa Antibiotics Hives and Swelling    Face swelling   Amoxicillin-Pot Clavulanate Diarrhea    Other reaction(s): Myalgias (intolerance), Other (See Comments)    Ciprofloxacin Other (See Comments)    Joint inflammation   Donepezil Other (See Comments)    Arthralgias (intolerance)   Flagyl [Metronidazole] Other (See Comments)    Altered mental state   Ivp Dye [Iodinated Contrast Media] Hives    Specifically for ct scans, tolerates mri dye   Other Itching    Narcotic pain medicines cause itching but can be managed with benadryl   Metoclopramide Palpitations    [name], PharmD, BCPS Clinical Staff Pharmacist [email or web address]  [name] 10/09/2021 1:00 PM

## 2021-10-09 NOTE — Progress Notes (Addendum)
PROGRESS NOTE   [name]  CXK:[id number]    DOB: [birth year]    DOA: 10/07/2021  PCP: [facility]   I have briefly reviewed patients previous medical records in [facility].  Chief Complaint  Patient presents with   Flank Pain   Fever    Brief Narrative:  56 year old female with extensive PMH including ADHD, anxiety, depression, insomnia, type II DM, GERD, HTN, OSA, nephrolithiasis, multiple cystoscopies, UTIs, s/p right ureteroscopy at the [place] on 7/26, presented to the ED on 7/29 after removing her stent 1 day early secondary to severe pain and subsequent fever up to 104 F, tachycardia and poorly controlled pain.  She had undergone ureteroscopy for a right mid ureteral stone.  In the ED met sepsis criteria with fever of 103.3 F, pulse rate of 157, urine microscopy suggestive of UTI and CT abdomen demonstrated some mild right hydroureteronephrosis with a small stone fragment in the mid ureter and some ureteral edema.  She was admitted for sepsis secondary to acute pyelonephritis, right hydronephrosis, secondary to poorly draining kidney from ureteral edema and a small stone fragment from her recent ureteroscopy, laser lithotripsy.  Slowly improving.   Assessment & Plan:  Principal Problem:   Sepsis secondary to UTI [facility]) Active Problems:   Right ureteral stone   GERD (gastroesophageal reflux disease)   Diabetes mellitus type 2, noninsulin dependent (HCC)   Anxiety with somatization   Bulging lumbar disc   Hydronephrosis, right   Attention-deficit hyperactivity disorder, predominantly hyperactive type   Hypertension   Palpitations  Sepsis secondary to acute pyelonephritis from right ureteral obstruction: Sepsis treated per protocol with aggressive IV fluids and started empirically on IV cefepime.  Blood cultures negative to date.  Urology was consulted and urgently taken to the OR and underwent cystoscopy and right ureteral stent placement 7/29. Multimodality  pain management.  Has itching related to opioids, as needed IV Benadryl.  Minimize opioids as soon as able.  Blood cultures: Negative to date.  Urine culture not helpful, multiple species present suggesting contamination.  Day 3 IV cefepime.  Still has significant right flank to groin pain, has used several doses of IV Dilaudid, oral oxycodone, IV Benadryl as needed for itching and a dose of Zofran this morning.  Await urology follow-up.  Lactic acidosis: Secondary to sepsis and treated as above.  Improved.  Anemia: Could be dilutional and related to acute illness.  Hemoglobin stable in the 10 g range.  Thrombocytopenia: Likely due to acute infectious illness and may also be due to antibiotics.  Stable.  Follow CBC closely.  GERD (gastroesophageal reflux disease) PPI.   Diabetes mellitus type 2, noninsulin dependent ([place]) Holding metformin due to acute illness and lactic acidosis. A1c 6.4. Continue SSI and reasonable inpatient control with CBGs mostly in the 100s..   Hypertension As noted yesterday, changed metoprolol tartrate from 150 Mg daily as she was taking it PTA to 75 Mg twice daily.  Controlled.  Outpatient follow-up.    Palpitations Continue metoprolol tartrate 75 Mg twice daily.  Outpatient follow-up with PCP regarding need to consolidate this to Toprol-XL which patient thought she was on PTA but actually was not on it.     Anxiety, depression, ADHD and insomnia Patient on polypharmacy and confirmed with her that she has been on these for an extended period of time and she wants to resume all of them including Adderall, bupropion, BuSpar, nortriptyline and Vortioxetine.  Cold sores Developed these on her lips like in  the past.  Requests valacyclovir which has helped her before, ordered.  Body mass index is 33.28 kg/m.    DVT prophylaxis: SCDs Start: 10/07/21 0935     Code Status: Full Code:  Family Communication: None at bedside. Disposition:  Status is:  Inpatient Remains inpatient appropriate because: IV antibiotics.  Still requiring significant amounts of IV and oral opioids.     Consultants:   Urology  Procedures:   As above  Antimicrobials:   As above   Subjective:  No hematuria.  Slept better last night.  Still having right flank to groin pain rated at 6/10 after a dose of IV Dilaudid this morning.  Had BM yesterday.  Developed cold sores on her lips and is asking for valacyclovir.  Objective:   Vitals:   10/08/21 0442 10/08/21 1126 10/08/21 2041 10/09/21 0438  BP: (!) 113/57 115/71 130/84 120/76  Pulse: 84 92 68 68  Resp: $Remo'18 20 18 18  'rjgGY$ Temp:  99.5 F (37.5 C) 99.5 F (37.5 C) 99.4 F (37.4 C)  TempSrc:  Oral    SpO2: 97% 95% 93% 95%  Weight:        General exam: Young female, moderately built and nourished sitting up comfortably at edge of bed.  Patient's 2 female RN is at bedside.  Oral mucosa moist.  Lips with some cold sores developing. Respiratory system: Clear to auscultation.  No increased work of breathing. Cardiovascular system: S1 & S2 heard, RRR. No JVD, murmurs, rubs, gallops or clicks. No pedal edema.  Telemetry personally reviewed: Sinus rhythm. Gastrointestinal system: Abdomen is soft and nondistended.  Mild right mid quadrant tenderness without rigidity, guarding or rebound.  No organomegaly or masses felt. Normal bowel sounds heard. Central nervous system: Alert and oriented. No focal neurological deficits. Extremities: Symmetric 5 x 5 power. Skin: No rashes, lesions or ulcers Psychiatry: Judgement and insight appear normal. Mood & affect appropriate.     Data Reviewed:   I have personally reviewed following labs and imaging studies   CBC: Recent Labs  Lab 10/07/21 0758 10/08/21 0426 10/09/21 0431  WBC 6.3 7.5 5.6  NEUTROABS 5.8 6.8  --   HGB 12.7 10.5* 10.6*  HCT 38.1 31.6* 33.5*  MCV 90.1 91.6 93.3  PLT 166 113* 116*    Basic Metabolic Panel: Recent Labs  Lab 10/07/21 0758  10/08/21 0426 10/09/21 0431  NA 138 140 139  K 4.2 4.6 4.1  CL 102 108 105  CO2 $Re'22 25 27  'nQz$ GLUCOSE 164* 175* 117*  BUN 18 5* 17  CREATININE 1.02* 0.84 0.88  CALCIUM 9.2 9.2 9.1    Liver Function Tests: Recent Labs  Lab 10/07/21 0758  AST 28  ALT 29  ALKPHOS 78  BILITOT 0.9  PROT 7.1  ALBUMIN 3.9    CBG: Recent Labs  Lab 10/08/21 2043 10/09/21 0807 10/09/21 1240  GLUCAP 145* 104* 126*    Microbiology Studies:   Recent Results (from the past 240 hour(s))  Culture, blood (Routine x 2)     Status: None (Preliminary result)   Collection Time: 10/07/21  7:58 AM   Specimen: BLOOD  Result Value Ref Range Status   Specimen Description   Final    BLOOD SITE NOT SPECIFIED Performed at [facility], [place] [address] [place], [place] [address]    Special Requests   Final    BOTTLES DRAWN AEROBIC AND ANAEROBIC Blood Culture adequate volume Performed at [facility] [address] [place], [place]  [address]    Culture   Final    NO GROWTH 2 DAYS Performed at [facility], [place] [address], [place], [place] [address]    Report Status PENDING  Incomplete  SARS Coronavirus 2 by RT PCR (hospital order, performed in [facility] hospital lab) *cepheid single result test*     Status: None   Collection Time: 10/07/21  8:20 AM   Specimen: Nasal Swab  Result Value Ref Range Status   SARS Coronavirus 2 by RT PCR NEGATIVE NEGATIVE Final    Comment: (NOTE) SARS-CoV-2 target nucleic acids are NOT DETECTED.  The SARS-CoV-2 RNA is generally detectable in upper and lower respiratory specimens during the acute phase of infection. The lowest concentration of SARS-CoV-2 viral copies this assay can detect is 250 copies / mL. A negative result does not preclude SARS-CoV-2 infection and should not be used as the sole basis for treatment or other patient management decisions.  A negative result may occur with improper specimen collection /  handling, submission of specimen other than nasopharyngeal swab, presence of viral mutation(s) within the areas targeted by this assay, and inadequate number of viral copies (<250 copies / mL). A negative result must be combined with clinical observations, patient history, and epidemiological information.  Fact Sheet for Patients:   https://www.patel.info/  Fact Sheet for Healthcare Providers: https://hall.com/  This test is not yet approved or  cleared by the [place] FDA and has been authorized for detection and/or diagnosis of SARS-CoV-2 by FDA under an Emergency Use Authorization (EUA).  This EUA will remain in effect (meaning this test can be used) for the duration of the COVID-19 declaration under Section 564(b)(1) of the Act, 21 U.S.C. section 360bbb-3(b)(1), unless the authorization is terminated or revoked sooner.  Performed at [facility], [place] [address], [place], [place] [address]   Urine Culture     Status: Abnormal   Collection Time: 10/07/21 11:23 AM   Specimen: Urine, Clean Catch  Result Value Ref Range Status   Specimen Description   Final    URINE, CLEAN CATCH Performed at [facility] [place] [address], [place], [place] [address]    Special Requests   Final    NONE Performed at [facility], [place] [address] [place], [place] [address]    Culture MULTIPLE SPECIES PRESENT, SUGGEST RECOLLECTION (A)  Final   Report Status 10/09/2021 FINAL  Final    Radiology Studies:  No results found.  Scheduled Meds:    amphetamine-dextroamphetamine  20 mg Oral q morning   buPROPion  300 mg Oral q morning   busPIRone  15 mg Oral BID   insulin aspart  0-15 Units Subcutaneous TID WC   metoprolol tartrate  75 mg Oral BID   nortriptyline  50 mg Oral QHS   pantoprazole  40 mg Oral Daily   tamsulosin  0.4 mg Oral q morning   valACYclovir  2,000 mg Oral Q12H    vortioxetine HBr  20 mg Oral q morning    Continuous Infusions:    ceFEPime (MAXIPIME) IV 2 g (10/09/21 1022)   lactated ringers     sodium chloride       LOS: 2 days     Vernell Leep, MD,  FACP, [facility], [facility], [facility] (Care Management Physician Certified) [place]  To contact the attending provider between 7A-7P or the covering provider during after hours 7P-7A, please log into the web site [email or web address] and  access using universal  password for that web site. If you do not have the password, please call the hospital operator.  10/09/2021, 12:43 PM

## 2021-10-10 DIAGNOSIS — N12 Tubulo-interstitial nephritis, not specified as acute or chronic: Secondary | ICD-10-CM | POA: Diagnosis not present

## 2021-10-10 DIAGNOSIS — N1330 Unspecified hydronephrosis: Secondary | ICD-10-CM

## 2021-10-10 DIAGNOSIS — N201 Calculus of ureter: Secondary | ICD-10-CM | POA: Diagnosis not present

## 2021-10-10 DIAGNOSIS — A419 Sepsis, unspecified organism: Secondary | ICD-10-CM | POA: Diagnosis not present

## 2021-10-10 LAB — GLUCOSE, CAPILLARY
Glucose-Capillary: 142 mg/dL — ABNORMAL HIGH (ref 70–99)
Glucose-Capillary: 185 mg/dL — ABNORMAL HIGH (ref 70–99)

## 2021-10-10 LAB — CBC
HCT: 33.8 % — ABNORMAL LOW (ref 36.0–46.0)
Hemoglobin: 11 g/dL — ABNORMAL LOW (ref 12.0–15.0)
MCH: 29.6 pg (ref 26.0–34.0)
MCHC: 32.5 g/dL (ref 30.0–36.0)
MCV: 91.1 fL (ref 80.0–100.0)
Platelets: 142 10*3/uL — ABNORMAL LOW (ref 150–400)
RBC: 3.71 MIL/uL — ABNORMAL LOW (ref 3.87–5.11)
RDW: 13.4 % (ref 11.5–15.5)
WBC: 5.5 10*3/uL (ref 4.0–10.5)
nRBC: 0 % (ref 0.0–0.2)

## 2021-10-10 MED ORDER — METOPROLOL TARTRATE 50 MG PO TABS: 75.0000 mg | ORAL_TABLET | Freq: Two times a day (BID) | ORAL | 1 refills | Status: AC

## 2021-10-10 MED ORDER — OXYCODONE HCL 5 MG PO TABS: 5.0000 mg | ORAL_TABLET | Freq: Four times a day (QID) | ORAL | 0 refills | Status: DC | PRN

## 2021-10-10 MED ORDER — ENSURE ENLIVE PO LIQD: 237.0000 mL | Freq: Two times a day (BID) | ORAL | Status: AC

## 2021-10-10 MED ORDER — ACETAMINOPHEN 500 MG PO TABS: 500.0000 mg | ORAL_TABLET | Freq: Four times a day (QID) | ORAL | Status: AC | PRN

## 2021-10-10 MED ORDER — CEPHALEXIN 500 MG PO CAPS: 500.0000 mg | ORAL_CAPSULE | Freq: Three times a day (TID) | ORAL | 0 refills | Status: AC

## 2021-10-10 NOTE — Progress Notes (Signed)
  Transition of Care [facility]) Screening Note   Patient Details  Name: [name] Date of Birth: 23-Jul-1965   Transition of Care [facility]) CM/SW Contact:    Dessa Phi, RN Phone Number: 10/10/2021, 2:14 PM    Transition of Care Department [facility] has reviewed patient and no TOC needs have been identified at this time. We will continue to monitor patient advancement through interdisciplinary progression rounds. If new patient transition needs arise, please place a TOC consult.

## 2021-10-10 NOTE — Progress Notes (Signed)
Nutrition Brief Note  Patient identified on the Malnutrition Screening Tool (MST) Report.  Wt Readings from Last 15 Encounters:  10/07/21 90.7 kg  09/13/21 90.7 kg  08/24/21 88.5 kg  12/20/20 93 kg  12/16/20 93 kg  03/08/20 94.8 kg  07/15/14 94.8 kg  04/21/14 97.6 kg  12/16/13 99.3 kg  11/10/13 102.1 kg  08/14/13 100.2 kg  07/28/13 99.1 kg  07/24/13 97.1 kg  05/19/13 96.6 kg  03/31/13 95.3 kg    Body mass index is 33.28 kg/m. Patient meets criteria for obesity based on current BMI. Skin WDL. No information documented in the edema section of flow sheet.   Current diet order is Heart Healthy/Carb Modified. She ate 100% of all meals on 7/30, 90% of lunch yesterday, and 100% of breakfast this AM.   Ensure ordered BID yesterday afternoon and she accepted both bottles offered. Each supplement provides 350 kcal and 20 grams protein.   Labs and medications reviewed.   No nutrition interventions warranted at this time. If nutrition issues arise, please consult RD.      Jarome Matin, MS, RD, LDN, [place] Registered Dietitian II Inpatient Clinical Nutrition RD pager # and on-call/weekend pager # available in [facility]

## 2021-10-10 NOTE — Discharge Summary (Signed)
Physician Discharge Summary  [name] CBJ:[id number] DOB: Jan 09, 1966  PCP: [facility], [facility]  Admitted from: Home Discharged to: Home  Admit date: 10/07/2021 Discharge date: 10/10/2021  Recommendations for Outpatient Follow-up:    Follow-up Information     Ardis Hughs, MD Follow up in 1 week(s).   Specialty: Urology Why: Stent removal.  Office will call to arrange the appointment.  Please call them if you do not hear from them in 2-3 business days. Contact information: [place] [address] [phone]         Clinic, [place] Schedule an appointment as soon as possible for a visit in 1 week(s).   Why: To be seen with repeat labs (CBC & BMP). Contact information: [place] [id number] [phone]                  Home Health: None    Equipment/Devices: None    Discharge Condition: Improved and stable.   Code Status: Full Code Diet recommendation:  Discharge Diet Orders (From admission, onward)     Start     Ordered   10/10/21 0000  Diet - low sodium heart healthy        10/10/21 1336   10/10/21 0000  Diet Carb Modified        10/10/21 1336             Discharge Diagnoses:  Principal Problem:   Sepsis secondary to UTI [facility]) Active Problems:   Right ureteral stone   GERD (gastroesophageal reflux disease)   Diabetes mellitus type 2, noninsulin dependent (HCC)   Anxiety with somatization   Bulging lumbar disc   Hydronephrosis, right   Attention-deficit hyperactivity disorder, predominantly hyperactive type   Hypertension   Palpitations   Brief Summary: 56 year old female, retired Therapist, 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  She had undergone ureteroscopy for a right mid ureteral stone.  In the ED met sepsis criteria with fever of 103.3 F, pulse rate of 157, urine microscopy suggestive of UTI and CT abdomen demonstrated some mild right hydroureteronephrosis with a small stone fragment in the mid ureter and some ureteral edema.  She was admitted for sepsis secondary to acute pyelonephritis, right hydronephrosis, secondary to poorly draining kidney from ureteral edema and a small stone fragment from her recent ureteroscopy, laser lithotripsy.  Urology consulted, s/p cystoscopy and right ureteral stent placement 7/29.  Clinically improved, urology will arrange close outpatient follow-up for stent removal.     Assessment & Plan:   Sepsis, POA secondary to acute pyelonephritis from right ureteral obstruction: Sepsis treated per protocol with aggressive IV fluids and started empirically on IV cefepime.  Blood cultures negative to date.  Urine culture shows multiple species, not helpful, suggesting contamination.  Urology was consulted and urgently taken to the OR and underwent cystoscopy and right ureteral stent placement 7/29. Multimodality pain management.  Had itching related to opioids, as needed IV Benadryl.  Postprocedure over the next couple of days, required multimodality pain control with IV/oral opioids, as needed Benadryl for itching, she made gradual improvement.  She has not used any IV opioids  since yesterday.  Pain controlled on oral meds.  She reports that she has no pain when she is resting quietly.  She developed some pain in the right flank to groin after eating or during urination.  She denies dysuria or hematuria.  No fever or chills.  Tolerating diet.  Having daily BMs.  Ambulating well in the room.  She is anxious to go home because she feels better and also because her pet dog is supposed to have babies  tomorrow.  Has defervesced.  No leukocytosis.  She has completed 3 days of IV antibiotics.  I communicated with Dr. Louis Meckel, urology who recommended Bactrim or Keflex for a total 10 days of antibiotics.  Patient however allergic to sulfa but has tolerated Keflex in the past so discharging on Keflex.  Dr. Louis Meckel indicated that his office will arrange close outpatient follow-up this week for stent removal.  He has cleared her for discharge home.  Lactic acidosis: Secondary to sepsis and treated as above.  Improved.   Anemia: Could be dilutional and related to acute illness.  Hemoglobin stable and up to 11 g per DL on day of discharge.  Thrombocytopenia: Likely due to acute infectious illness and may also be due to antibiotics.  Improving, up to 142 on day of discharge.   GERD (gastroesophageal reflux disease) PPI.   Diabetes mellitus type 2, noninsulin dependent ([place]) Held metformin due to acute illness and lactic acidosis. A1c 6.4. Continue SSI and reasonable inpatient control with CBGs mostly in the 100s. Resume metformin at discharge.   Hypertension As outpatient, patient was on metoprolol tartrate 150 mg daily.  She thought that she was on the extended release preparation.  After discussing with her, metoprolol tartrate was changed to 75 Mg twice daily which she has tolerated well.  She may continue the current dose or follow-up with her PCP to consolidated to metoprolol succinate.      Palpitations Continue metoprolol tartrate 75 Mg twice daily.  Outpatient follow-up with PCP regarding need to consolidate this to Toprol-XL which patient thought she was on PTA but actually was not on it.     Anxiety, depression, ADHD and insomnia Patient on polypharmacy and confirmed with her that she has been on these for an extended period of time and she wants to resume all of them including Adderall, bupropion, BuSpar, nortriptyline and Vortioxetine.  Close outpatient follow-up with her PCP.    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  Requests valacyclovir which has helped her before.  Treated with same and improved.  Vaginal thrush As per report.  Treated with a single dose of Diflucan.  Improved.   Body mass index is 33.28 kg/m.       Consultants:   Urology   Procedures:   As above   Discharge Instructions  Discharge Instructions     Call MD for:  difficulty breathing, headache or visual disturbances   Complete by: As directed    Call MD for:  extreme fatigue   Complete by: As directed    Call MD for:  persistant dizziness or light-headedness   Complete by: As directed    Call MD for:  persistant nausea and vomiting   Complete by: As directed    Call MD for:  severe uncontrolled pain   Complete by: As directed    Call MD for:  temperature >100.4   Complete by: As directed    Diet - low sodium heart healthy  Complete by: As directed    Diet Carb Modified   Complete by: As directed    Increase activity slowly   Complete by: As directed         Medication List     STOP taking these medications    cyclobenzaprine 10 MG tablet Commonly known as: FLEXERIL   meloxicam 15 MG tablet Commonly known as: MOBIC   ondansetron 8 MG disintegrating tablet Commonly known as: ZOFRAN-ODT   oxyCODONE-acetaminophen 5-325 MG tablet Commonly known as: PERCOCET/ROXICET   phenazopyridine 95 MG tablet Commonly known as: PYRIDIUM       TAKE these medications    acetaminophen 500 MG tablet Commonly known as: TYLENOL Take 1-2 tablets (500-1,000 mg total) by mouth every 6 (six) hours as needed for mild pain, moderate pain or fever (pain). What changed:  how much to take reasons to take this   amphetamine-dextroamphetamine 20 MG 24 hr capsule Commonly known as: ADDERALL XR Take 20 mg by mouth every morning.   buPROPion 300 MG 24 hr tablet Commonly known as: WELLBUTRIN XL Take 300 mg by mouth every morning.   busPIRone 15 MG  tablet Commonly known as: BUSPAR Take 15 mg by mouth in the morning and at bedtime.   cephALEXin 500 MG capsule Commonly known as: KEFLEX Take 1 capsule (500 mg total) by mouth 3 (three) times daily for 7 days.   conjugated estrogens vaginal cream Commonly known as: PREMARIN Place 1 applicator vaginally 2 (two) times a week.   dicyclomine 10 MG capsule Commonly known as: BENTYL Take 10 mg by mouth 2 (two) times daily as needed (cramps).   feeding supplement Liqd Take 237 mLs by mouth 2 (two) times daily between meals.   hydrocortisone 2.5 % cream Apply 1 application topically 2 (two) times daily as needed (irritation).   Melatonin 10 MG Caps Take 10 mg by mouth at bedtime.   metFORMIN 500 MG tablet Commonly known as: GLUCOPHAGE Take 500 mg by mouth 2 (two) times daily.   metoprolol tartrate 50 MG tablet Commonly known as: LOPRESSOR Take 1.5 tablets (75 mg total) by mouth 2 (two) times daily. What changed:  medication strength how much to take when to take this   naproxen 375 MG tablet Commonly known as: NAPROSYN Take 1 tablet (375 mg total) by mouth 2 (two) times daily.   nortriptyline 50 MG capsule Commonly known as: PAMELOR Take 50 mg by mouth at bedtime.   omeprazole 40 MG capsule Commonly known as: PRILOSEC Take 40 mg by mouth 2 (two) times daily before a meal.   ondansetron 8 MG tablet Commonly known as: ZOFRAN Take 8 mg by mouth every 8 (eight) hours as needed for nausea or vomiting.   oxybutynin 5 MG tablet Commonly known as: DITROPAN Take 5 mg by mouth every 8 (eight) hours as needed for bladder spasms (pain related to kidney stones).   oxyCODONE 5 MG immediate release tablet Commonly known as: Roxicodone Take 1-2 tablets (5-10 mg total) by mouth every 6 (six) hours as needed for severe pain. What changed:  how much to take when to take this   tamsulosin 0.4 MG Caps capsule Commonly known as: FLOMAX Take 0.4 mg by mouth every morning.    vortioxetine HBr 20 MG Tabs tablet Commonly known as: TRINTELLIX Take 20 mg by mouth every morning.       Allergies  Allergen Reactions   Sulfa Antibiotics Hives and Swelling    Face swelling   Amoxicillin-Pot Clavulanate Diarrhea  Other reaction(s): Myalgias (intolerance), Other (See Comments)    Ciprofloxacin Other (See Comments)    Joint inflammation   Donepezil Other (See Comments)    Arthralgias (intolerance)   Flagyl [Metronidazole] Other (See Comments)    Altered mental state   Ivp Dye [Iodinated Contrast Media] Hives    Specifically for ct scans, tolerates mri dye   Other Itching    Narcotic pain medicines cause itching but can be managed with benadryl   Metoclopramide Palpitations      Procedures/Studies: DG C-Arm 1-60 Min-No Report  Result Date: 10/07/2021 Fluoroscopy was utilized by the requesting physician.  No radiographic interpretation.   DG Chest 2 View  Result Date: 10/07/2021 CLINICAL DATA:  56 year old female with possible sepsis. EXAM: CHEST - 2 VIEW COMPARISON:  CT Abdomen and Pelvis today. Chest radiographs 03/06/2020. FINDINGS: Semi upright AP and lateral views of the chest at 0834 hours. Normal lung volumes and mediastinal contours. Visualized tracheal air column is within normal limits. Both lungs appear stable and clear. No pneumothorax or pleural effusion. No acute osseous abnormality identified. Negative visible bowel gas. IMPRESSION: Negative.  No acute cardiopulmonary abnormality. Electronically Signed   By: Genevie Ann M.D.   On: 10/07/2021 08:58   CT Renal Stone Study  Result Date: 10/07/2021 CLINICAL DATA:  56 year old female with flank pain and recent right ureteral stone. EXAM: CT ABDOMEN AND PELVIS WITHOUT CONTRAST TECHNIQUE: Multidetector CT imaging of the abdomen and pelvis was performed following the standard protocol without IV contrast. RADIATION DOSE REDUCTION: This exam was performed according to the departmental dose-optimization  program which includes automated exposure control, adjustment of the mA and/or kV according to patient size and/or use of iterative reconstruction technique. COMPARISON:  09/26/2021 CT Abdomen and Pelvis and earlier. FINDINGS: Lower chest: Stable, negative. Hepatobiliary: Stable, absent gallbladder. Pancreas: Negative. Spleen: Negative. 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 Normal right adrenal gland. Left kidney is stable and nonobstructed. Progressed right hydronephrosis and pararenal inflammation since 09/26/2021. Progressed net flow Meg special et progressed nephromegaly. Progressed right periureteral inflammation and right hydroureter continuing into the pelvis where punctate ureteral calculus is now visible just distal to the pelvic inlet on series 2, image 70. This is approximately 5 cm proximal to the ureterovesical junction. And there could be a 2nd punctate distal ureteral calculus near the UVJ on coronal image 114. This is not delineated on the axial images. Occasional pelvic phleboliths otherwise. Stomach/Bowel: Sigmoid anastomosis redemonstrated. Diverticulosis of the descending colon. Normal retrocecal appendix. No dilated small bowel. Mild fluid distended stomach. No free air or free fluid. Vascular/Lymphatic: Normal caliber abdominal aorta. Aortoiliac calcified atherosclerosis. No lymphadenopathy identified. Reproductive: Negative noncontrast appearance. Other: No pelvic free fluid. Musculoskeletal: Lumbar facet arthropathy. No acute osseous abnormality identified. IMPRESSION: 1. Progressed Right Side Obstructive Uropathy from earlier this month. Probable forniceal rupture now. Evidence of two punctate distal ureteral calculi now, both located in the pelvis (coronal images 108 and 114). 2. Otherwise stable noncontrast abdomen and pelvis. Electronically Signed   By: Genevie Ann M.D.   On:  10/07/2021 08:57    Subjective: Interviewed and examined along with patient's female RN in the room.  Overall patient states that she feels much better.  Has been tolerating diet without nausea or vomiting.  Having daily BMs.  Pain control has improved.  No pain at rest.  Intermittent right flank to groin pain on eating or on urinating when it lasts for  about 30 minutes after urinating.  Denies dysuria.  No fever or chills.  Anxious to go home.  Discharge Exam:  Vitals:   10/09/21 1310 10/09/21 1324 10/09/21 2120 10/10/21 0426  BP: 131/84  (!) 132/93 131/81  Pulse: 76  69 69  Resp: _0 Temp: (!) 100.4 F (38 C)  99.4 F (37.4 C) 98.6 F (37 C)  TempSrc: Oral  Oral Oral  SpO2: 100%   95%  Weight:      Height:  _1  (1.651 m)      General exam: Young female, moderately built and nourished sitting up comfortably at edge of bed.  Patient's female RN at bedside.  Oral mucosa moist.  Lips seem better without any overt cold sore at this time. Respiratory system: Clear to auscultation.  No increased work of breathing. Cardiovascular system: S1 & S2 heard, RRR. No JVD, murmurs, rubs, gallops or clicks. No pedal edema.   Gastrointestinal system: Abdomen is nondistended, soft and nontender.  No organomegaly or masses appreciated.  Normal bowel sounds heard. Central nervous system: Alert and oriented. No focal neurological deficits. Extremities: Symmetric 5 x 5 power. Skin: No rashes, lesions or ulcers Psychiatry: Judgement and insight appear normal. Mood & affect appropriate.     The results of significant diagnostics from this hospitalization (including imaging, microbiology, ancillary and laboratory) are listed below for reference.     Microbiology: Recent Results (from the past 240 hour(s))  Culture, blood (Routine x 2)     Status: None (Preliminary result)   Collection Time: 10/07/21  7:58 AM   Specimen: BLOOD  Result Value Ref Range Status   Specimen Description   Final     BLOOD SITE NOT SPECIFIED Performed at [facility] [place] [address] [place], [place] [address]    Special Requests   Final    BOTTLES DRAWN AEROBIC AND ANAEROBIC Blood Culture adequate volume Performed at [place] [address] [place], [place] [address]    Culture   Final    NO GROWTH 3 DAYS Performed at [facility], [place] [address] [place], [place] [address]    Report Status PENDING  Incomplete  SARS Coronavirus 2 by RT PCR (hospital order, performed in [facility] hospital lab) *cepheid single result test*     Status: None   Collection Time: 10/07/21  8:20 AM   Specimen: Nasal Swab  Result Value Ref Range Status   SARS Coronavirus 2 by RT PCR NEGATIVE NEGATIVE Final    Comment: (NOTE) SARS-CoV-2 target nucleic acids are NOT DETECTED.  The SARS-CoV-2 RNA is generally detectable in upper and lower respiratory specimens during the acute phase of infection. The lowest concentration of SARS-CoV-2 viral copies this assay can detect is 250 copies / mL. A negative result does not preclude SARS-CoV-2 infection and should not be used as the sole basis for treatment or other patient management decisions.  A negative result may occur with improper specimen collection / handling, submission of specimen other than nasopharyngeal swab, presence of viral mutation(s) within the areas targeted by this assay, and inadequate number of viral copies (<250 copies / mL). A negative result must be combined with clinical observations, patient history, and epidemiological information.  Fact Sheet for Patients:   https://www.patel.info/  Fact Sheet for Healthcare Providers: https://hall.com/  This test is not yet approved or  cleared by the [place] FDA and has been authorized for detection and/or diagnosis of SARS-CoV-2 by  FDA under an Emergency Use Authorization (EUA).  This EUA will remain in  effect (meaning this test can be used) for the duration of the COVID-19 declaration under Section 564(b)(1) of the Act, 21 U.S.C. section 360bbb-3(b)(1), unless the authorization is terminated or revoked sooner.  Performed at [facility], [place] [address] [place], [place] [address]   Urine Culture     Status: Abnormal   Collection Time: 10/07/21 11:23 AM   Specimen: Urine, Clean Catch  Result Value Ref Range Status   Specimen Description   Final    URINE, CLEAN CATCH Performed at [facility], [place] [address] [place], [place] [address]    Special Requests   Final    NONE Performed at [facility], [place] [address] [place], [place] [address]    Culture MULTIPLE SPECIES PRESENT, SUGGEST RECOLLECTION (A)  Final   Report Status 10/09/2021 FINAL  Final     Labs: CBC: Recent Labs  Lab 10/07/21 0758 10/08/21 0426 10/09/21 0431 10/10/21 0336  WBC 6.3 7.5 5.6 5.5  NEUTROABS 5.8 6.8  --   --   HGB 12.7 10.5* 10.6* 11.0*  HCT 38.1 31.6* 33.5* 33.8*  MCV 90.1 91.6 93.3 91.1  PLT 166 113* 116* 142*    Basic Metabolic Panel: Recent Labs  Lab 10/07/21 0758 10/08/21 0426 10/09/21 0431  NA 138 140 139  K 4.2 4.6 4.1  CL 102 108 105  CO2 _0 GLUCOSE 164* 175* 117*  BUN 18 5* 17  CREATININE 1.02* 0.84 0.88  CALCIUM 9.2 9.2 9.1    Liver Function Tests: Recent Labs  Lab 10/07/21 0758  AST 28  ALT 29  ALKPHOS 78  BILITOT 0.9  PROT 7.1  ALBUMIN 3.9    CBG: Recent Labs  Lab 10/09/21 1240 10/09/21 1638 10/09/21 2152 10/10/21 0730 10/10/21 1149  GLUCAP 126* 220* 149* 142* 185*    Hgb A1c Recent Labs    10/08/21 0426  HGBA1C 6.4*     Urinalysis    Component Value Date/Time   COLORURINE ORANGE (A) 10/07/2021 0925   APPEARANCEUR HAZY (A) 10/07/2021 0925   APPEARANCEUR Clear 09/12/2011 1223   LABSPEC  10/07/2021 0925    TEST NOT REPORTED DUE TO COLOR INTERFERENCE OF URINE PIGMENT   LABSPEC  1.015 09/12/2011 1223   PHURINE  10/07/2021 0925    TEST NOT REPORTED DUE TO COLOR INTERFERENCE OF URINE PIGMENT   GLUCOSEU (A) 10/07/2021 0925    TEST NOT REPORTED DUE TO COLOR INTERFERENCE OF URINE PIGMENT   GLUCOSEU Negative 09/12/2011 1223   HGBUR (A) 10/07/2021 0925    TEST NOT REPORTED DUE TO COLOR INTERFERENCE OF URINE PIGMENT   BILIRUBINUR (A) 10/07/2021 0925    TEST NOT REPORTED DUE TO COLOR INTERFERENCE OF URINE PIGMENT   BILIRUBINUR Negative 09/12/2011 1223   KETONESUR (A) 10/07/2021 0925    TEST NOT REPORTED DUE TO COLOR INTERFERENCE OF URINE PIGMENT   PROTEINUR (A) 10/07/2021 0925    TEST NOT REPORTED DUE TO COLOR INTERFERENCE OF URINE PIGMENT   UROBILINOGEN 2.0 (H) 10/26/2013 0017   NITRITE (A) 10/07/2021 0925    TEST NOT REPORTED DUE TO COLOR INTERFERENCE OF URINE PIGMENT   LEUKOCYTESUR (A) 10/07/2021 0925    TEST NOT REPORTED DUE TO COLOR INTERFERENCE OF URINE PIGMENT   LEUKOCYTESUR Negative 09/12/2011 1223      Time coordinating discharge: 35 minutes  SIGNED:  Vernell Leep, MD,  FACP, [facility], [facility], [facility] (Care  Management Physician Certified). [facility]  To contact the attending provider between 7A-7P or the covering provider during after hours 7P-7A, please log into the web site [email or web address] and access using universal [place] password for that web site. If you do not have the password, please call the hospital operator.

## 2021-10-12 LAB — CULTURE, BLOOD (ROUTINE X 2)
Culture: NO GROWTH
Special Requests: ADEQUATE

## 2022-08-17 ENCOUNTER — Emergency Department (HOSPITAL_COMMUNITY): Payer: No Typology Code available for payment source

## 2022-08-17 ENCOUNTER — Emergency Department (HOSPITAL_COMMUNITY)
Admission: EM | Admit: 2022-08-17 | Discharge: 2022-08-17 | Disposition: A | Discharge status: Home / Self Care | Payer: No Typology Code available for payment source | Attending: Emergency Medicine | Admitting: Emergency Medicine

## 2022-08-17 ENCOUNTER — Other Ambulatory Visit: Payer: Self-pay

## 2022-08-17 ENCOUNTER — Encounter (HOSPITAL_COMMUNITY): Payer: Self-pay

## 2022-08-17 DIAGNOSIS — Z7984 Long term (current) use of oral hypoglycemic drugs: Secondary | ICD-10-CM | POA: Diagnosis not present

## 2022-08-17 DIAGNOSIS — E119 Type 2 diabetes mellitus without complications: Secondary | ICD-10-CM | POA: Diagnosis not present

## 2022-08-17 DIAGNOSIS — R109 Unspecified abdominal pain: Secondary | ICD-10-CM | POA: Diagnosis present

## 2022-08-17 DIAGNOSIS — N200 Calculus of kidney: Secondary | ICD-10-CM | POA: Diagnosis not present

## 2022-08-17 LAB — CBC WITH DIFFERENTIAL/PLATELET
Abs Immature Granulocytes: 0.04 10*3/uL (ref 0.00–0.07)
Basophils Absolute: 0.1 10*3/uL (ref 0.0–0.1)
Basophils Relative: 1 %
Eosinophils Absolute: 0.2 10*3/uL (ref 0.0–0.5)
Eosinophils Relative: 3 %
HCT: 42 % (ref 36.0–46.0)
Hemoglobin: 14.4 g/dL (ref 12.0–15.0)
Immature Granulocytes: 1 %
Lymphocytes Relative: 29 %
Lymphs Abs: 2.2 10*3/uL (ref 0.7–4.0)
MCH: 31.9 pg (ref 26.0–34.0)
MCHC: 34.3 g/dL (ref 30.0–36.0)
MCV: 93.1 fL (ref 80.0–100.0)
Monocytes Absolute: 0.5 10*3/uL (ref 0.1–1.0)
Monocytes Relative: 6 %
Neutro Abs: 4.6 10*3/uL (ref 1.7–7.7)
Neutrophils Relative %: 60 %
Platelets: 214 10*3/uL (ref 150–400)
RBC: 4.51 MIL/uL (ref 3.87–5.11)
RDW: 12.9 % (ref 11.5–15.5)
WBC: 7.6 10*3/uL (ref 4.0–10.5)
nRBC: 0 % (ref 0.0–0.2)

## 2022-08-17 LAB — URINALYSIS, ROUTINE W REFLEX MICROSCOPIC
Bilirubin Urine: NEGATIVE
Glucose, UA: NEGATIVE mg/dL
Hgb urine dipstick: NEGATIVE
Ketones, ur: 5 mg/dL — AB
Leukocytes,Ua: NEGATIVE
Nitrite: NEGATIVE
Protein, ur: NEGATIVE mg/dL
Specific Gravity, Urine: 1.023 (ref 1.005–1.030)
pH: 5 (ref 5.0–8.0)

## 2022-08-17 LAB — COMPREHENSIVE METABOLIC PANEL
ALT: 59 U/L — ABNORMAL HIGH (ref 0–44)
AST: 47 U/L — ABNORMAL HIGH (ref 15–41)
Albumin: 4.3 g/dL (ref 3.5–5.0)
Alkaline Phosphatase: 63 U/L (ref 38–126)
Anion gap: 14 (ref 5–15)
BUN: 11 mg/dL (ref 6–20)
CO2: 23 mmol/L (ref 22–32)
Calcium: 9.6 mg/dL (ref 8.9–10.3)
Chloride: 101 mmol/L (ref 98–111)
Creatinine, Ser: 1.11 mg/dL — ABNORMAL HIGH (ref 0.44–1.00)
GFR, Estimated: 58 mL/min — ABNORMAL LOW (ref >60)
Glucose, Bld: 122 mg/dL — ABNORMAL HIGH (ref 70–99)
Potassium: 4.9 mmol/L (ref 3.5–5.1)
Sodium: 138 mmol/L (ref 135–145)
Total Bilirubin: 0.3 mg/dL (ref 0.3–1.2)
Total Protein: 7.5 g/dL (ref 6.5–8.1)

## 2022-08-17 LAB — LIPASE, BLOOD: Lipase: 38 U/L (ref 11–51)

## 2022-08-17 MED ORDER — ONDANSETRON HCL 4 MG/2ML IJ SOLN
4.0000 mg | Freq: Once | INTRAMUSCULAR | Status: AC
  Administered 2022-08-17: 4 mg via INTRAVENOUS
  Filled 2022-08-17: qty 2

## 2022-08-17 MED ORDER — OXYCODONE HCL ER 10 MG PO T12A
10.0000 mg | EXTENDED_RELEASE_TABLET | Freq: Two times a day (BID) | ORAL | Status: DC
  Administered 2022-08-17: 10 mg via ORAL
  Filled 2022-08-17: qty 1

## 2022-08-17 MED ORDER — KETOROLAC TROMETHAMINE 30 MG/ML IJ SOLN
15.0000 mg | Freq: Once | INTRAMUSCULAR | Status: AC
  Administered 2022-08-17: 15 mg via INTRAVENOUS
  Filled 2022-08-17: qty 1

## 2022-08-17 MED ORDER — OXYCODONE HCL 5 MG PO TABS: 5.0000 mg | ORAL_TABLET | ORAL | 0 refills | Status: AC | PRN

## 2022-08-17 MED ORDER — SODIUM CHLORIDE 0.9 % IV BOLUS
1000.0000 mL | Freq: Once | INTRAVENOUS | Status: AC
  Administered 2022-08-17: 1000 mL via INTRAVENOUS

## 2022-08-17 MED ORDER — MORPHINE SULFATE (PF) 4 MG/ML IV SOLN
4.0000 mg | Freq: Once | INTRAVENOUS | Status: AC
  Administered 2022-08-17: 4 mg via INTRAVENOUS
  Filled 2022-08-17: qty 1

## 2022-08-17 NOTE — ED Provider Notes (Signed)
[place] EMERGENCY DEPARTMENT AT [facility] Provider Note   CSN: [id number] Arrival date & time: 08/17/22  1229     History  Chief Complaint  Patient presents with   Flank Pain   HPI HADDY MULLINAX is a 57 y.o. female with history of nephrolithiasis, s/p partial colectomy, and diabetes presenting for right flank pain.  Started about a week ago with dysuria.  She was seen yesterday at the [facility] and was found to have a 5 mm nonobstructive right kidney stone.  Around midnight this morning her pain was acutely much worse and woke her from sleep.  It is sharp in the right flank and radiates to the groin.  Denies fever or chills.  States that this time she is not having any urinary symptoms.  Endorses nausea but no vomiting diarrhea.   Flank Pain       Home Medications Prior to Admission medications   Medication Sig Start Date End Date Taking? Authorizing Provider  acetaminophen (TYLENOL) 500 MG tablet Take 1-2 tablets (500-1,000 mg total) by mouth every 6 (six) hours as needed for mild pain, moderate pain or fever (pain). 10/10/21   Hongalgi, Maximino Greenland, MD  amphetamine-dextroamphetamine (ADDERALL XR) 20 MG 24 hr capsule Take 20 mg by mouth every morning.    [provider]  buPROPion (WELLBUTRIN XL) 300 MG 24 hr tablet Take 300 mg by mouth every morning. 04/06/16   [provider]  busPIRone (BUSPAR) 15 MG tablet Take 15 mg by mouth in the morning and at bedtime. 02/21/17   [provider]  conjugated estrogens (PREMARIN) vaginal cream Place 1 applicator vaginally 2 (two) times a week.    [provider]  dicyclomine (BENTYL) 10 MG capsule Take 10 mg by mouth 2 (two) times daily as needed (cramps).    [provider]  feeding supplement (ENSURE ENLIVE / ENSURE PLUS) LIQD Take 237 mLs by mouth 2 (two) times daily between meals. 10/10/21   Hongalgi, Maximino Greenland, MD  hydrocortisone 2.5 % cream Apply 1 application topically 2 (two) times daily  as needed (irritation).    [provider]  Melatonin 10 MG CAPS Take 10 mg by mouth at bedtime.    [provider]  metFORMIN (GLUCOPHAGE) 500 MG tablet Take 500 mg by mouth 2 (two) times daily. 04/14/16   [provider]  metoprolol tartrate (LOPRESSOR) 50 MG tablet Take 1.5 tablets (75 mg total) by mouth 2 (two) times daily. 10/10/21   Hongalgi, Maximino Greenland, MD  naproxen (NAPROSYN) 375 MG tablet Take 1 tablet (375 mg total) by mouth 2 (two) times daily. 09/26/21   Linwood Dibbles, MD  nortriptyline (PAMELOR) 50 MG capsule Take 50 mg by mouth at bedtime.    [provider]  omeprazole (PRILOSEC) 40 MG capsule Take 40 mg by mouth 2 (two) times daily before a meal.    [provider]  ondansetron (ZOFRAN) 8 MG tablet Take 8 mg by mouth every 8 (eight) hours as needed for nausea or vomiting.    [provider]  oxybutynin (DITROPAN) 5 MG tablet Take 5 mg by mouth every 8 (eight) hours as needed for bladder spasms (pain related to kidney stones).    [provider]  oxyCODONE (ROXICODONE) 5 MG immediate release tablet Take 1-2 tablets (5-10 mg total) by mouth every 6 (six) hours as needed for severe pain. 10/10/21   Hongalgi, Maximino Greenland, MD  tamsulosin (FLOMAX) 0.4 MG CAPS capsule Take 0.4 mg by  mouth every morning.    [provider]  vortioxetine HBr (TRINTELLIX) 20 MG TABS tablet Take 20 mg by mouth every morning. 09/03/17   [provider]      Allergies    Sulfa antibiotics, Amoxicillin-pot clavulanate, Ciprofloxacin, Donepezil, Flagyl [metronidazole], Ivp dye [iodinated contrast media], Other, and Metoclopramide    Review of Systems   Review of Systems  Genitourinary:  Positive for flank pain.    Physical Exam Updated Vital Signs BP (!) 141/101 (BP Location: Left Arm)   Pulse 87   Temp 98.8 F (37.1 C) (Oral)   Resp 18   Ht 5\' 5"  (1.651 m)   Wt 93 kg   LMP 07/13/2013   SpO2 95%   BMI 34.11 kg/m  Physical Exam Vitals  and nursing note reviewed.  HENT:     Head: Normocephalic and atraumatic.     Mouth/Throat:     Mouth: Mucous membranes are moist.  Eyes:     General:        Right eye: No discharge.        Left eye: No discharge.     Conjunctiva/sclera: Conjunctivae normal.  Cardiovascular:     Rate and Rhythm: Normal rate and regular rhythm.     Pulses: Normal pulses.     Heart sounds: Normal heart sounds.  Pulmonary:     Effort: Pulmonary effort is normal.     Breath sounds: Normal breath sounds.  Abdominal:     General: Abdomen is flat.     Palpations: Abdomen is soft.     Tenderness: There is abdominal tenderness in the right lower quadrant. There is right CVA tenderness. There is no left CVA tenderness.  Skin:    General: Skin is warm and dry.  Neurological:     General: No focal deficit present.  Psychiatric:        Mood and Affect: Mood normal.     ED Results / Procedures / Treatments   Labs (all labs ordered are listed, but only abnormal results are displayed) Labs Reviewed  COMPREHENSIVE METABOLIC PANEL - Abnormal; Notable for the following components:      Result Value   Glucose, Bld 122 (*)    Creatinine, Ser 1.11 (*)    AST 47 (*)    ALT 59 (*)    GFR, Estimated 58 (*)    All other components within normal limits  URINALYSIS, ROUTINE W REFLEX MICROSCOPIC - Abnormal; Notable for the following components:   Ketones, ur 5 (*)    All other components within normal limits  CBC WITH DIFFERENTIAL/PLATELET  LIPASE, BLOOD    EKG None  Radiology CT RENAL STONE STUDY  Result Date: 08/17/2022 CLINICAL DATA:  Abdominal pain.  Flank pain. EXAM: CT ABDOMEN AND PELVIS WITHOUT CONTRAST TECHNIQUE: Multidetector CT imaging of the abdomen and pelvis was performed following the standard protocol without IV contrast. RADIATION DOSE REDUCTION: This exam was performed according to the departmental dose-optimization program which includes automated exposure control, adjustment of the mA  and/or kV according to patient size and/or use of iterative reconstruction technique. COMPARISON:  CT 10/07/2021 and older FINDINGS: Lower chest: 4 mm left lower lobe lung nodule was not seen previously, series 5, image 11. Mild adjacent ground-glass. Hepatobiliary: Fatty liver infiltration identified on this non IV contrast exam. Previous cholecystectomy. Pancreas: Unremarkable. No pancreatic ductal dilatation or surrounding inflammatory changes. Spleen: Normal in size without focal abnormality.  Small splenule. Adrenals/Urinary Tract: Right adrenal gland is preserved. 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 Mild bilateral renal atrophy. There are 3 punctate nonobstructing right-sided renal stones. No left-sided renal stone. There are some parapelvic renal cysts identified bilaterally. The ureters have normal course and caliber extending down to the bladder. Preserved contours of the urinary bladder. Stomach/Bowel: Diffuse colonic stool. Surgical changes from resection and primary anastomosis along the distal sigmoid colon. Scattered colonic diverticula. No oral contrast. Normal retrocecal appendix. Stomach is nondilated. Small bowel is nondilated. Vascular/Lymphatic: Aortic atherosclerosis. No enlarged abdominal or pelvic lymph nodes. Reproductive: Uterus and bilateral adnexa are unremarkable. Other: No abdominal wall hernia or abnormality. No abdominopelvic ascites. Musculoskeletal: Scattered degenerative changes along the spine and pelvis. Mild levoconvex curvature of the lower lumbar spine. Mild multilevel disc bulge. IMPRESSION: Nonobstructing right-sided renal stones. No ureteral stones or collecting system dilatation. Bilateral renal atrophy. Fatty liver infiltration. Surgical changes along the distal sigmoid colon. No bowel obstruction or free air. Normal appendix.  Scattered colonic diverticula. New 4 mm left lower lobe lung nodule. Although likely benign, if the patient is high-risk, given the morphology and/or location of this nodule a non-contrast chest CT can be considered in 12 months.This recommendation follows the consensus statement: Guidelines for Management of Incidental Pulmonary Nodules Detected on CT Images: From the Fleischner Society 2017; Radiology 2017; 284:228-243. Electronically Signed   By: Karen Kays M.D.   On: 08/17/2022 15:14    Procedures Procedures    Medications Ordered in ED Medications  oxyCODONE (OXYCONTIN) 12 hr tablet 10 mg (has no administration in time range)  sodium chloride 0.9 % bolus 1,000 mL (0 mLs Intravenous Stopped 08/17/22 1424)  morphine (PF) 4 MG/ML injection 4 mg (4 mg Intravenous Given 08/17/22 1315)  ondansetron (ZOFRAN) injection 4 mg (4 mg Intravenous Given 08/17/22 1315)  ketorolac (TORADOL) 30 MG/ML injection 15 mg (15 mg Intravenous Given 08/17/22 1419)    ED Course/ Medical Decision Making/ A&P                             Medical Decision Making Amount and/or Complexity of Data Reviewed Labs: ordered. Radiology: ordered.  Risk Prescription drug management.   Initial Impression and Ddx 57 year old well-appearing female presenting for flank pain.  Exam notable for right CVA tenderness.  Dx includes pyelonephritis, nephrolithiasis, urolithiasis, acute cholecystitis, UTI, other intra-abdominal infection, dehydration, sepsis Patient PMH that increases complexity of ED encounter:    Interpretation of Diagnostics -I independent reviewed and interpreted the labs as followed:  history of nephrolithiasis  - I independently visualized the following imaging with scope of interpretation limited to determining acute life threatening conditions related to emergency care: CT renal stone, which revealed multiple nonobstructing right renal stones  Patient Reassessment and Ultimate Disposition/Management After  treatment with normal saline bolus, Toradol, morphine and Zofran, patient stated that pain was improved.  At this time, have low suspicion for infection associated with kidney stones given reassuring UA, no leukocytosis and no fever.  CMP suggest mild AKI.  Treated with volume bolus.  Doubt obstruction given no evidence on CT scan.  Advised to continue taking Flomax as prescribed by the [place].  Advised her to go to her scheduled urology appointment at the [facility] this coming Tuesday. Advised to continue taking Flomax as prescribed by the [place].  Discussed pertinent return precautions.  Sent a few tabs of oxycodone to her pharmacy for  acute pain in the next couple days.  Vital stable discharge.  Discharged home.  Patient management required discussion with the following services or consulting groups:  None  Complexity of Problems Addressed Acute complicated illness or Injury  Additional Data Reviewed and Analyzed Further history obtained from: Past medical history and medications listed in the EMR, Prior ED visit notes, and Recent discharge summary  Patient Encounter Risk Assessment Prescriptions         Final Clinical Impression(s) / ED Diagnoses Final diagnoses:  Renal stones    Rx / DC Orders ED Discharge Orders     None         Gareth Eagle, PA-C 08/17/22 1532    Lorre Nick, MD 08/18/22 2249

## 2022-08-17 NOTE — ED Triage Notes (Signed)
Patient has had right and left sided flank pain for 2 days. 5mm kidney stone on the right side.

## 2022-08-17 NOTE — Discharge Instructions (Signed)
Evaluation revealed that you have multiple nonobstructing right kidney stones this is likely the source of your pain.  Recommend you continue taking the Flomax as prescribed, continue assertive hydration, also sent oxycodone to your pharmacy for acute pain in the next couple days.  Also recommend you follow-up with your urologist at your next scheduled appointment next week.  If you have new fever, worsening flank pain, blood in the urine malodorous urine or any other concerning symptom please return emerged part for evaluation.
